# Patient Record
Sex: Female | Born: 1970
Health system: Southern US, Community
[De-identification: ages and names within clinical notes are randomized; demographics above are authoritative.]

## PROBLEM LIST (undated history)

## (undated) DIAGNOSIS — C801 Malignant (primary) neoplasm, unspecified: Secondary | ICD-10-CM

## (undated) DIAGNOSIS — D649 Anemia, unspecified: Secondary | ICD-10-CM

## (undated) DIAGNOSIS — Z8051 Family history of malignant neoplasm of kidney: Secondary | ICD-10-CM

## (undated) DIAGNOSIS — C186 Malignant neoplasm of descending colon: Secondary | ICD-10-CM

## (undated) DIAGNOSIS — F32A Depression, unspecified: Secondary | ICD-10-CM

## (undated) DIAGNOSIS — G43909 Migraine, unspecified, not intractable, without status migrainosus: Secondary | ICD-10-CM

## (undated) DIAGNOSIS — I10 Essential (primary) hypertension: Secondary | ICD-10-CM

## (undated) DIAGNOSIS — F329 Major depressive disorder, single episode, unspecified: Secondary | ICD-10-CM

## (undated) DIAGNOSIS — Z9889 Other specified postprocedural states: Secondary | ICD-10-CM

## (undated) DIAGNOSIS — K56609 Unspecified intestinal obstruction, unspecified as to partial versus complete obstruction: Secondary | ICD-10-CM

## (undated) DIAGNOSIS — C8190 Hodgkin lymphoma, unspecified, unspecified site: Secondary | ICD-10-CM

## (undated) DIAGNOSIS — E119 Type 2 diabetes mellitus without complications: Secondary | ICD-10-CM

## (undated) HISTORY — PX: DILATION AND CURETTAGE OF UTERUS: SHX78

## (undated) HISTORY — DX: Other specified postprocedural states: Z98.890

## (undated) HISTORY — DX: Family history of malignant neoplasm of kidney: Z80.51

---
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
## 1994-05-29 HISTORY — PX: LYMPH NODE BIOPSY: SHX201

## 2006-03-06 ENCOUNTER — Other Ambulatory Visit: Admission: RE | Admit: 2006-03-06 | Discharge: 2006-03-06 | Payer: Self-pay | Admitting: Family Medicine

## 2006-07-27 ENCOUNTER — Encounter: Admission: RE | Admit: 2006-07-27 | Discharge: 2006-07-27 | Payer: Self-pay | Admitting: Gastroenterology

## 2007-03-11 ENCOUNTER — Other Ambulatory Visit: Admission: RE | Admit: 2007-03-11 | Discharge: 2007-03-11 | Payer: Self-pay | Admitting: Family Medicine

## 2008-04-06 ENCOUNTER — Other Ambulatory Visit: Admission: RE | Admit: 2008-04-06 | Discharge: 2008-04-06 | Payer: Self-pay | Admitting: Family Medicine

## 2009-05-14 ENCOUNTER — Other Ambulatory Visit: Admission: RE | Admit: 2009-05-14 | Discharge: 2009-05-14 | Payer: Self-pay | Admitting: Family Medicine

## 2010-12-12 ENCOUNTER — Other Ambulatory Visit (HOSPITAL_COMMUNITY)
Admission: RE | Admit: 2010-12-12 | Discharge: 2010-12-12 | Disposition: A | Discharge status: Home / Self Care | Payer: BC Managed Care – PPO | Source: Ambulatory Visit | Attending: Family Medicine | Admitting: Family Medicine

## 2010-12-12 ENCOUNTER — Other Ambulatory Visit: Payer: Self-pay | Admitting: Family Medicine

## 2010-12-12 DIAGNOSIS — Z01419 Encounter for gynecological examination (general) (routine) without abnormal findings: Secondary | ICD-10-CM | POA: Insufficient documentation

## 2015-06-30 DIAGNOSIS — E119 Type 2 diabetes mellitus without complications: Secondary | ICD-10-CM

## 2015-06-30 HISTORY — DX: Type 2 diabetes mellitus without complications: E11.9

## 2015-07-07 ENCOUNTER — Other Ambulatory Visit (HOSPITAL_COMMUNITY)
Admission: RE | Admit: 2015-07-07 | Discharge: 2015-07-07 | Disposition: A | Discharge status: Home / Self Care | Payer: 59 | Source: Ambulatory Visit | Attending: Family Medicine | Admitting: Family Medicine

## 2015-07-07 ENCOUNTER — Other Ambulatory Visit: Payer: Self-pay | Admitting: Family Medicine

## 2015-07-07 DIAGNOSIS — Z01419 Encounter for gynecological examination (general) (routine) without abnormal findings: Secondary | ICD-10-CM | POA: Insufficient documentation

## 2015-07-08 LAB — CYTOLOGY - PAP

## 2015-12-28 DIAGNOSIS — C186 Malignant neoplasm of descending colon: Secondary | ICD-10-CM

## 2015-12-28 HISTORY — DX: Malignant neoplasm of descending colon: C18.6

## 2016-01-16 ENCOUNTER — Emergency Department (HOSPITAL_COMMUNITY): Payer: BLUE CROSS/BLUE SHIELD

## 2016-01-16 ENCOUNTER — Encounter (HOSPITAL_COMMUNITY): Payer: Self-pay | Admitting: Emergency Medicine

## 2016-01-16 ENCOUNTER — Inpatient Hospital Stay (HOSPITAL_COMMUNITY)
Admission: EM | Admit: 2016-01-16 | Discharge: 2016-01-25 | DRG: 330 | Disposition: A | Discharge status: Home Health | Payer: BLUE CROSS/BLUE SHIELD | Attending: Internal Medicine | Admitting: Internal Medicine

## 2016-01-16 DIAGNOSIS — D649 Anemia, unspecified: Secondary | ICD-10-CM | POA: Diagnosis present

## 2016-01-16 DIAGNOSIS — C186 Malignant neoplasm of descending colon: Principal | ICD-10-CM

## 2016-01-16 DIAGNOSIS — E878 Other disorders of electrolyte and fluid balance, not elsewhere classified: Secondary | ICD-10-CM | POA: Diagnosis not present

## 2016-01-16 DIAGNOSIS — M3390 Dermatopolymyositis, unspecified, organ involvement unspecified: Secondary | ICD-10-CM | POA: Diagnosis not present

## 2016-01-16 DIAGNOSIS — K3189 Other diseases of stomach and duodenum: Secondary | ICD-10-CM | POA: Diagnosis not present

## 2016-01-16 DIAGNOSIS — K5900 Constipation, unspecified: Secondary | ICD-10-CM | POA: Diagnosis not present

## 2016-01-16 DIAGNOSIS — K6389 Other specified diseases of intestine: Secondary | ICD-10-CM | POA: Diagnosis not present

## 2016-01-16 DIAGNOSIS — E876 Hypokalemia: Secondary | ICD-10-CM | POA: Diagnosis not present

## 2016-01-16 DIAGNOSIS — Y92234 Operating room of hospital as the place of occurrence of the external cause: Secondary | ICD-10-CM | POA: Diagnosis not present

## 2016-01-16 DIAGNOSIS — C189 Malignant neoplasm of colon, unspecified: Secondary | ICD-10-CM | POA: Diagnosis not present

## 2016-01-16 DIAGNOSIS — K5660 Unspecified intestinal obstruction: Secondary | ICD-10-CM

## 2016-01-16 DIAGNOSIS — R933 Abnormal findings on diagnostic imaging of other parts of digestive tract: Secondary | ICD-10-CM | POA: Diagnosis not present

## 2016-01-16 DIAGNOSIS — K913 Postprocedural intestinal obstruction: Secondary | ICD-10-CM | POA: Diagnosis not present

## 2016-01-16 DIAGNOSIS — E119 Type 2 diabetes mellitus without complications: Secondary | ICD-10-CM | POA: Diagnosis not present

## 2016-01-16 DIAGNOSIS — E118 Type 2 diabetes mellitus with unspecified complications: Secondary | ICD-10-CM

## 2016-01-16 DIAGNOSIS — Z8572 Personal history of non-Hodgkin lymphomas: Secondary | ICD-10-CM

## 2016-01-16 DIAGNOSIS — R140 Abdominal distension (gaseous): Secondary | ICD-10-CM | POA: Diagnosis not present

## 2016-01-16 DIAGNOSIS — Z7984 Long term (current) use of oral hypoglycemic drugs: Secondary | ICD-10-CM | POA: Diagnosis not present

## 2016-01-16 DIAGNOSIS — C772 Secondary and unspecified malignant neoplasm of intra-abdominal lymph nodes: Secondary | ICD-10-CM | POA: Diagnosis present

## 2016-01-16 DIAGNOSIS — T819XXA Unspecified complication of procedure, initial encounter: Secondary | ICD-10-CM

## 2016-01-16 DIAGNOSIS — K5669 Other intestinal obstruction: Secondary | ICD-10-CM | POA: Diagnosis not present

## 2016-01-16 DIAGNOSIS — Z9221 Personal history of antineoplastic chemotherapy: Secondary | ICD-10-CM | POA: Diagnosis not present

## 2016-01-16 DIAGNOSIS — K567 Ileus, unspecified: Secondary | ICD-10-CM | POA: Diagnosis not present

## 2016-01-16 DIAGNOSIS — F32A Depression, unspecified: Secondary | ICD-10-CM | POA: Diagnosis present

## 2016-01-16 DIAGNOSIS — F329 Major depressive disorder, single episode, unspecified: Secondary | ICD-10-CM | POA: Diagnosis not present

## 2016-01-16 DIAGNOSIS — C187 Malignant neoplasm of sigmoid colon: Secondary | ICD-10-CM | POA: Diagnosis not present

## 2016-01-16 DIAGNOSIS — R1032 Left lower quadrant pain: Secondary | ICD-10-CM | POA: Diagnosis not present

## 2016-01-16 DIAGNOSIS — K56609 Unspecified intestinal obstruction, unspecified as to partial versus complete obstruction: Secondary | ICD-10-CM | POA: Diagnosis present

## 2016-01-16 DIAGNOSIS — R1110 Vomiting, unspecified: Secondary | ICD-10-CM | POA: Diagnosis not present

## 2016-01-16 DIAGNOSIS — M3313 Other dermatomyositis without myopathy: Secondary | ICD-10-CM | POA: Diagnosis present

## 2016-01-16 DIAGNOSIS — Z79899 Other long term (current) drug therapy: Secondary | ICD-10-CM | POA: Diagnosis not present

## 2016-01-16 DIAGNOSIS — R112 Nausea with vomiting, unspecified: Secondary | ICD-10-CM | POA: Diagnosis not present

## 2016-01-16 HISTORY — DX: Type 2 diabetes mellitus without complications: E11.9

## 2016-01-16 HISTORY — DX: Hodgkin lymphoma, unspecified, unspecified site: C81.90

## 2016-01-16 HISTORY — DX: Malignant (primary) neoplasm, unspecified: C80.1

## 2016-01-16 HISTORY — DX: Unspecified intestinal obstruction, unspecified as to partial versus complete obstruction: K56.609

## 2016-01-16 HISTORY — DX: Migraine, unspecified, not intractable, without status migrainosus: G43.909

## 2016-01-16 HISTORY — DX: Major depressive disorder, single episode, unspecified: F32.9

## 2016-01-16 HISTORY — DX: Malignant neoplasm of descending colon: C18.6

## 2016-01-16 HISTORY — DX: Depression, unspecified: F32.A

## 2016-01-16 LAB — URINALYSIS, ROUTINE W REFLEX MICROSCOPIC
BILIRUBIN URINE: NEGATIVE
Glucose, UA: NEGATIVE mg/dL
Hgb urine dipstick: NEGATIVE
KETONES UR: 40 mg/dL — AB
LEUKOCYTES UA: NEGATIVE
NITRITE: NEGATIVE
PH: 6 (ref 5.0–8.0)
PROTEIN: NEGATIVE mg/dL
Specific Gravity, Urine: 1.02 (ref 1.005–1.030)

## 2016-01-16 LAB — CBC WITH DIFFERENTIAL/PLATELET
BASOS PCT: 0 %
Basophils Absolute: 0 10*3/uL (ref 0.0–0.1)
EOS ABS: 0 10*3/uL (ref 0.0–0.7)
Eosinophils Relative: 0 %
HEMATOCRIT: 38.7 % (ref 36.0–46.0)
Hemoglobin: 12.2 g/dL (ref 12.0–15.0)
Lymphocytes Relative: 13 %
Lymphs Abs: 2.2 10*3/uL (ref 0.7–4.0)
MCH: 24.3 pg — AB (ref 26.0–34.0)
MCHC: 31.5 g/dL (ref 30.0–36.0)
MCV: 76.9 fL — ABNORMAL LOW (ref 78.0–100.0)
MONO ABS: 0.9 10*3/uL (ref 0.1–1.0)
MONOS PCT: 5 %
NEUTROS ABS: 13.3 10*3/uL — AB (ref 1.7–7.7)
Neutrophils Relative %: 82 %
Platelets: 442 10*3/uL — ABNORMAL HIGH (ref 150–400)
RBC: 5.03 MIL/uL (ref 3.87–5.11)
RDW: 14.8 % (ref 11.5–15.5)
WBC: 16.3 10*3/uL — ABNORMAL HIGH (ref 4.0–10.5)

## 2016-01-16 LAB — COMPREHENSIVE METABOLIC PANEL
ALBUMIN: 3.9 g/dL (ref 3.5–5.0)
ALT: 11 U/L — ABNORMAL LOW (ref 14–54)
ANION GAP: 15 (ref 5–15)
AST: 17 U/L (ref 15–41)
Alkaline Phosphatase: 74 U/L (ref 38–126)
BILIRUBIN TOTAL: 1.3 mg/dL — AB (ref 0.3–1.2)
BUN: 10 mg/dL (ref 6–20)
CO2: 23 mmol/L (ref 22–32)
Calcium: 9.5 mg/dL (ref 8.9–10.3)
Chloride: 99 mmol/L — ABNORMAL LOW (ref 101–111)
Creatinine, Ser: 0.79 mg/dL (ref 0.44–1.00)
GFR calc non Af Amer: >60 mL/min (ref >60)
GLUCOSE: 175 mg/dL — AB (ref 65–99)
POTASSIUM: 3.1 mmol/L — AB (ref 3.5–5.1)
Sodium: 137 mmol/L (ref 135–145)
TOTAL PROTEIN: 7.5 g/dL (ref 6.5–8.1)

## 2016-01-16 LAB — GLUCOSE, CAPILLARY
GLUCOSE-CAPILLARY: 110 mg/dL — AB (ref 65–99)
GLUCOSE-CAPILLARY: 115 mg/dL — AB (ref 65–99)

## 2016-01-16 LAB — LIPASE, BLOOD: LIPASE: 27 U/L (ref 11–51)

## 2016-01-16 LAB — MAGNESIUM: Magnesium: 2.5 mg/dL — ABNORMAL HIGH (ref 1.7–2.4)

## 2016-01-16 LAB — PREGNANCY, URINE: PREG TEST UR: NEGATIVE

## 2016-01-16 LAB — PHOSPHORUS: PHOSPHORUS: 3.8 mg/dL (ref 2.5–4.6)

## 2016-01-16 MED ORDER — SODIUM CHLORIDE 0.9 % IV BOLUS (SEPSIS)
1000.0000 mL | Freq: Once | INTRAVENOUS | Status: AC
  Administered 2016-01-16: 1000 mL via INTRAVENOUS

## 2016-01-16 MED ORDER — ONDANSETRON HCL 4 MG/2ML IJ SOLN
INTRAMUSCULAR | Status: AC
  Administered 2016-01-16: 4 mg
  Filled 2016-01-16: qty 2

## 2016-01-16 MED ORDER — POTASSIUM CHLORIDE 10 MEQ/100ML IV SOLN
10.0000 meq | INTRAVENOUS | Status: AC
  Administered 2016-01-16 (×6): 10 meq via INTRAVENOUS
  Filled 2016-01-16 (×2): qty 100

## 2016-01-16 MED ORDER — SODIUM CHLORIDE 0.9 % IV SOLN
INTRAVENOUS | Status: DC
  Administered 2016-01-16 – 2016-01-19 (×6): via INTRAVENOUS

## 2016-01-16 MED ORDER — ONDANSETRON HCL 4 MG/2ML IJ SOLN
4.0000 mg | Freq: Once | INTRAMUSCULAR | Status: AC
  Administered 2016-01-16: 4 mg via INTRAVENOUS
  Filled 2016-01-16: qty 2

## 2016-01-16 MED ORDER — FLEET ENEMA 7-19 GM/118ML RE ENEM
2.0000 | ENEMA | Freq: Once | RECTAL | Status: AC
  Administered 2016-01-16: 2 via RECTAL
  Filled 2016-01-16: qty 2

## 2016-01-16 MED ORDER — HEPARIN SODIUM (PORCINE) 5000 UNIT/ML IJ SOLN
5000.0000 [IU] | Freq: Three times a day (TID) | INTRAMUSCULAR | Status: DC
  Administered 2016-01-16 – 2016-01-17 (×3): 5000 [IU] via SUBCUTANEOUS
  Filled 2016-01-16 (×2): qty 1

## 2016-01-16 MED ORDER — KETOROLAC TROMETHAMINE 15 MG/ML IJ SOLN: 15.0000 mg | Freq: Four times a day (QID) | INTRAMUSCULAR | Status: DC | PRN

## 2016-01-16 MED ORDER — ONDANSETRON HCL 4 MG PO TABS: 4.0000 mg | ORAL_TABLET | Freq: Four times a day (QID) | ORAL | Status: DC | PRN

## 2016-01-16 MED ORDER — IOPAMIDOL (ISOVUE-300) INJECTION 61%
INTRAVENOUS | Status: AC
  Administered 2016-01-16: 100 mL
  Filled 2016-01-16: qty 100

## 2016-01-16 MED ORDER — KETOROLAC TROMETHAMINE 15 MG/ML IJ SOLN
INTRAMUSCULAR | Status: AC
  Administered 2016-01-16: 15 mg
  Filled 2016-01-16: qty 1

## 2016-01-16 MED ORDER — ONDANSETRON HCL 4 MG/2ML IJ SOLN
4.0000 mg | Freq: Four times a day (QID) | INTRAMUSCULAR | Status: DC | PRN
Start: 2016-01-16 — End: 2016-01-25
  Administered 2016-01-22 (×2): 4 mg via INTRAVENOUS
  Filled 2016-01-16 (×2): qty 2

## 2016-01-16 MED ORDER — SODIUM CHLORIDE 0.9% FLUSH
3.0000 mL | Freq: Two times a day (BID) | INTRAVENOUS | Status: DC
Start: 2016-01-16 — End: 2016-01-25
  Administered 2016-01-17 – 2016-01-24 (×5): 3 mL via INTRAVENOUS

## 2016-01-16 MED ORDER — MORPHINE SULFATE (PF) 4 MG/ML IV SOLN
4.0000 mg | Freq: Once | INTRAVENOUS | Status: AC
  Administered 2016-01-16: 4 mg via INTRAVENOUS
  Filled 2016-01-16: qty 1

## 2016-01-16 MED ORDER — INSULIN ASPART 100 UNIT/ML ~~LOC~~ SOLN
0.0000 [IU] | SUBCUTANEOUS | Status: DC
  Administered 2016-01-18: 2 [IU] via SUBCUTANEOUS
  Administered 2016-01-18: 3 [IU] via SUBCUTANEOUS
  Administered 2016-01-19 – 2016-01-22 (×3): 2 [IU] via SUBCUTANEOUS
  Administered 2016-01-22 – 2016-01-23 (×2): 3 [IU] via SUBCUTANEOUS
  Administered 2016-01-23 – 2016-01-24 (×3): 2 [IU] via SUBCUTANEOUS
  Administered 2016-01-24: 3 [IU] via SUBCUTANEOUS
  Administered 2016-01-24 – 2016-01-25 (×2): 2 [IU] via SUBCUTANEOUS

## 2016-01-16 MED ORDER — LORAZEPAM 2 MG/ML IJ SOLN
1.0000 mg | Freq: Four times a day (QID) | INTRAMUSCULAR | Status: DC | PRN
  Administered 2016-01-16 – 2016-01-22 (×5): 1 mg via INTRAVENOUS
  Filled 2016-01-16 (×5): qty 1

## 2016-01-16 NOTE — ED Notes (Signed)
Returned from [facility].  No change in status

## 2016-01-16 NOTE — Consult Note (Signed)
Reason for Consult: Bowel obstruction Referring Physician: Dr. Angie Fava Taylor Whitney is an 45 y.o. female.  HPI: Patient is a 45 year old female who arrives with a 3 day history of obstipation. Patient states her last bowel movement was Thursday prior to admission. She states it was small. She states that since that time she's had no flatus or bowel movements. She also states she's develop some right-sided abdominal pain. She's had nausea and vomiting over the last day or so. She's had no fevers or chills.  Patient has not noticed any blood in her stools. She has not had a previous colonoscopy. She has no family history of any cancer. She does have a history of Hodgkin's lymphoma. This was treated with chemotherapy.  While in the ER patient underwent a CT scan which revealed a left colon stricture versus mass.  Past Medical History:  Diagnosis Date  . Cancer ([place])   . Diabetes mellitus without complication ([place])     No past surgical history on file.  No family history on file.  Social History:  reports that she has never smoked. She has never used smokeless tobacco. She reports that she does not drink alcohol or use drugs.  Allergies:  Allergies  Allergen Reactions  . Sulfa Antibiotics Rash    Medications: I have reviewed the patient's current medications.  Results for orders placed or performed during the hospital encounter of 01/16/16 (from the past 48 hour(s))  CBC with Differential/Platelet     Status: Abnormal   Collection Time: 01/16/16  7:20 AM  Result Value Ref Range   WBC 16.3 (H) 4.0 - 10.5 K/uL   RBC 5.03 3.87 - 5.11 MIL/uL   Hemoglobin 12.2 12.0 - 15.0 g/dL   HCT 38.7 36.0 - 46.0 %   MCV 76.9 (L) 78.0 - 100.0 fL   MCH 24.3 (L) 26.0 - 34.0 pg   MCHC 31.5 30.0 - 36.0 g/dL   RDW 14.8 11.5 - 15.5 %   Platelets 442 (H) 150 - 400 K/uL   Neutrophils Relative % 82 %   Neutro Abs 13.3 (H) 1.7 - 7.7 K/uL   Lymphocytes Relative 13 %   Lymphs Abs 2.2 0.7 - 4.0  K/uL   Monocytes Relative 5 %   Monocytes Absolute 0.9 0.1 - 1.0 K/uL   Eosinophils Relative 0 %   Eosinophils Absolute 0.0 0.0 - 0.7 K/uL   Basophils Relative 0 %   Basophils Absolute 0.0 0.0 - 0.1 K/uL  Comprehensive metabolic panel     Status: Abnormal   Collection Time: 01/16/16  7:20 AM  Result Value Ref Range   Sodium 137 135 - 145 mmol/L   Potassium 3.1 (L) 3.5 - 5.1 mmol/L   Chloride 99 (L) 101 - 111 mmol/L   CO2 23 22 - 32 mmol/L   Glucose, Bld 175 (H) 65 - 99 mg/dL   BUN 10 6 - 20 mg/dL   Creatinine, Ser 0.79 0.44 - 1.00 mg/dL   Calcium 9.5 8.9 - 10.3 mg/dL   Total Protein 7.5 6.5 - 8.1 g/dL   Albumin 3.9 3.5 - 5.0 g/dL   AST 17 15 - 41 U/L   ALT 11 (L) 14 - 54 U/L   Alkaline Phosphatase 74 38 - 126 U/L   Total Bilirubin 1.3 (H) 0.3 - 1.2 mg/dL   GFR calc non Af Amer >60 >60 mL/min   GFR calc Af Amer >60 >60 mL/min    Comment: (NOTE) The eGFR has been calculated using  the CKD EPI equation. This calculation has not been validated in all clinical situations. eGFR's persistently <60 mL/min signify possible Chronic Kidney Disease.    Anion gap 15 5 - 15  Lipase, blood     Status: None   Collection Time: 01/16/16  7:20 AM  Result Value Ref Range   Lipase 27 11 - 51 U/L  Urinalysis, Routine w reflex microscopic (not at [facility])     Status: Abnormal   Collection Time: 01/16/16  8:57 AM  Result Value Ref Range   Color, Urine YELLOW YELLOW   APPearance CLEAR CLEAR   Specific Gravity, Urine 1.020 1.005 - 1.030   pH 6.0 5.0 - 8.0   Glucose, UA NEGATIVE NEGATIVE mg/dL   Hgb urine dipstick NEGATIVE NEGATIVE   Bilirubin Urine NEGATIVE NEGATIVE   Ketones, ur 40 (A) NEGATIVE mg/dL   Protein, ur NEGATIVE NEGATIVE mg/dL   Nitrite NEGATIVE NEGATIVE   Leukocytes, UA NEGATIVE NEGATIVE    Comment: MICROSCOPIC NOT DONE ON URINES WITH NEGATIVE PROTEIN, BLOOD, LEUKOCYTES, NITRITE, OR GLUCOSE <1000 mg/dL.  Pregnancy, urine     Status: None   Collection Time: 01/16/16  8:57 AM   Result Value Ref Range   Preg Test, Ur NEGATIVE NEGATIVE    Comment:        THE SENSITIVITY OF THIS METHODOLOGY IS >20 mIU/mL.     Ct Abdomen Pelvis W Contrast  Result Date: 01/16/2016 CLINICAL DATA:  Constipation. Vomiting and abdominal pain, 2 days duration. EXAM: CT ABDOMEN AND PELVIS WITH CONTRAST TECHNIQUE: Multidetector CT imaging of the abdomen and pelvis was performed using the standard protocol following bolus administration of intravenous contrast. CONTRAST:  174m ISOVUE-300 IOPAMIDOL (ISOVUE-300) INJECTION 61% COMPARISON:  07/27/2006. FINDINGS: Lower chest: Lungs are clear. There is focal thickening of or along the left diaphragm posterior medially. This was present in 2008 and has not changed. It could represent a benign mesenchymal mass. It probably does not require further follow-up. Hepatobiliary: Mild fatty change of the liver. No focal lesion. No calcified gallstones. Pancreas: Normal Spleen: Normal Adrenals/Urinary Tract: Adrenal glands are normal. Kidneys are normal. No cyst, mass, stone or hydronephrosis. Stomach/Bowel: The right colon, transverse colon and proximal descending colon are dilated and full of fluid and stool. There is a distinct caliber change at the mid descending colon. This could be due to a mass or a stricture. There is mild adjacent fat edema. Early perforation not excluded. No evidence of free air. The dilated portion of the colon shows wall thickening and mild stranding in the surrounding fat. This could be inflammation due to the obstruction. Vascular/Lymphatic: Aorta and IVC are normal. No retroperitoneal mass or lymphadenopathy. Reproductive: 2.6 cm leiomyoma projecting from the right fundus of the uterus. Both ovaries and adnexal regions appear normal. Other: None. Musculoskeletal:  Chronic disc herniations at L2-3, L3-4 and L4-5. IMPRESSION: Pattern of colon obstruction at the mid descending colon. Proximal to that, the colon is markedly dilated with fluid,  air and fecal matter. This could be due to a stricture or mass. In the obstructive portion, there is wall thickening and mild edema of the surrounding fat that could be secondary to the obstruction. Small amount of edema in the fat at the site of obstruction is also noted. No definite perforation or free air however. No evidence of adenopathy. Electronically Signed   By: MNelson ChimesM.D.   On: 01/16/2016 13:03   Dg Abd Acute W/chest  Result Date: 01/16/2016 CLINICAL DATA:  Constipation starting Friday, vomiting,  nausea EXAM: DG ABDOMEN ACUTE W/ 1V CHEST COMPARISON:  07/27/2006 FINDINGS: Cardiomediastinal silhouette is unremarkable. No acute infiltrate or pulmonary edema. There is distension of the right colon and transverse colon with gas and fluid. Paucity of bowel gas in distal colon. Colonic ileus or colonic obstruction cannot be excluded. No evidence of free abdominal air. Clinical correlation is necessary. IMPRESSION: No acute disease within chest.There is distension of the right colon and transverse colon with gas and fluid. Paucity of bowel gas in distal colon. Colonic ileus or colonic obstruction cannot be excluded. No evidence of free abdominal air. Clinical correlation is necessary. Electronically Signed   By: Lahoma Crocker M.D.   On: 01/16/2016 10:19    Review of Systems  Constitutional: Negative for chills, fever and weight loss.  HENT: Negative for ear pain, hearing loss and tinnitus.   Eyes: Negative for blurred vision, double vision and photophobia.  Respiratory: Negative for cough, hemoptysis and sputum production.   Cardiovascular: Negative for chest pain, palpitations, orthopnea and claudication.  Gastrointestinal: Positive for abdominal pain, constipation, nausea and vomiting. Negative for blood in stool, diarrhea, heartburn and melena.  Genitourinary: Negative for dysuria, frequency and hematuria.  Musculoskeletal: Negative for back pain, myalgias and neck pain.  Neurological:  Negative for dizziness, tingling, tremors, sensory change and headaches.  Psychiatric/Behavioral: Negative for depression, substance abuse and suicidal ideas.   Blood pressure 137/84, pulse 83, temperature 98.1 F (36.7 C), temperature source Oral, resp. rate 18, height '5\' 3"'  (1.6 m), weight 77.1 kg (170 lb), last menstrual period 01/03/2016, SpO2 96 %. Physical Exam  Constitutional: She is oriented to person, place, and time. She appears well-developed and well-nourished. No distress.  HENT:  Head: Normocephalic and atraumatic.  Right Ear: External ear normal.  Left Ear: External ear normal.  Eyes: Conjunctivae and EOM are normal. Pupils are equal, round, and reactive to light.  Neck: Normal range of motion. Neck supple. No tracheal deviation present.  Cardiovascular: Normal rate and regular rhythm.  Exam reveals no gallop and no friction rub.   No murmur heard. Respiratory: Effort normal and breath sounds normal. No stridor. No respiratory distress. She has no wheezes. She has no rales. She exhibits no tenderness.  GI: Soft. Bowel sounds are normal. She exhibits no distension and no mass. There is tenderness (rlq). There is no rebound and no guarding.  Musculoskeletal: Normal range of motion. She exhibits no edema or tenderness.  Neurological: She is alert and oriented to person, place, and time.  Skin: Skin is warm and dry. She is not diaphoretic.  Psychiatric: She has a normal mood and affect. Her behavior is normal.    Assessment/Plan: 45 year old female with a left colonic stricture versus mass Diabetes History of Hodgkin's lymphoma  1. At This time recommend admission, nothing by mouth. Patient currently no emesis by mouth continues may need NG tube. 2. We'll consult GI, Dr. Almyra Free, to evaluate for colonoscopy and/or mass. 3. CEA pending 4. We'll follow along.  Taylor Whitney., Taylor Whitney 01/16/2016, 2:20 PM

## 2016-01-16 NOTE — ED Triage Notes (Signed)
Pt experiencing constipation since Wednesday.  Nausea and vomiting starting Friday night.  Since then has "kept nothing down" and feels she may be dehydrated.

## 2016-01-16 NOTE — H&P (Signed)
[facility] History and Physical  Taylor Whitney [id number] DOB: [birth year] DOA: 01/16/2016  PCP: No primary care provider on file.   Chief Complaint: "I just kept vomiting."  HPI: Taylor Whitney is a 45 y.o. female with past oral history significant for diabetes and non-Hodgkin's lymphoma presents to emergency room with complaint nausea vomiting. Patient states she hasn't had a bowel movement since August 17. She has had some flatus. Over the past 2 days patient has had increasing nausea and vomiting. Patient had 10 episodes of vomiting the patient presented to the emergency room. On vomitus was dark green. Nonbloody. Patient states she has no history of obstruction. She's had no past abdominal or intra-peritoneal GYN surgeries.   ED course: CT revealed transverse and proximal large bowel obstruction In the emergency room general surgery and gastroenterology were consulted. Hospitalist service asked NG tube be placed.    Review of Systems:  As per HPI otherwise 10 point review of systems negative.   Past Medical History:  Diagnosis Date  . Cancer ([place])   . Diabetes mellitus without complication ([place])    No past surgical history on file. Social History:  reports that she has never smoked. She has never used smokeless tobacco. She reports that she does not drink alcohol or use drugs.  Allergies  Allergen Reactions  . Sulfa Antibiotics Rash    No family history on file.   Prior to Admission medications   Medication Sig Start Date End Date Taking? Authorizing Provider  metFORMIN (GLUCOPHAGE-XR) 500 MG 24 hr tablet Take 500 mg by mouth 2 (two) times daily. 10/27/15  Yes Historical Provider, MD  venlafaxine XR (EFFEXOR-XR) 75 MG 24 hr capsule Take 225 mg by mouth at bedtime. 12/28/15  Yes Historical Provider, MD   Physical Exam: Vitals:   01/16/16 1300 01/16/16 1330 01/16/16 1400 01/16/16 1430  BP: 137/84 139/84 147/91 150/89  Pulse: 83 84 89 85  Resp:        Temp:      TempSrc:      SpO2: 96% 100% 100% 100%  Weight:      Height:        Wt Readings from Last 3 Encounters:  01/16/16 77.1 kg (170 lb)    General:  Appears calm and uncomfortable Eyes:  PERRL, EOMI, normal lids, iris ENT:  grossly normal hearing, lips & tongue Neck:  no LAD, masses or thyromegaly Cardiovascular:  RRR, no m/r/g. No LE edema.  Respiratory:  CTA bilaterally, no w/r/r. Normal respiratory effort. Abdomen:  Distended for, right sided tenderness to palpation without rebound, BS+ Skin:  no rash or induration seen on limited exam Musculoskeletal:  grossly normal tone BUE/BLE Psychiatric:  grossly normal mood and affect, speech fluent and appropriate Neurologic:  CN 2-12 grossly intact, moves all extremities in coordinated fashion.          Labs on Admission:  Basic Metabolic Panel:  Recent Labs Lab 01/16/16 0720  NA 137  K 3.1*  CL 99*  CO2 23  GLUCOSE 175*  BUN 10  CREATININE 0.79  CALCIUM 9.5   Liver Function Tests:  Recent Labs Lab 01/16/16 0720  AST 17  ALT 11*  ALKPHOS 74  BILITOT 1.3*  PROT 7.5  ALBUMIN 3.9    Recent Labs Lab 01/16/16 0720  LIPASE 27   No results for input(s): AMMONIA in the last 168 hours. CBC:  Recent Labs Lab 01/16/16 0720  WBC 16.3*  NEUTROABS 13.3*  HGB 12.2  HCT 38.7  MCV 76.9*  PLT 442*   Cardiac Enzymes: No results for input(s): CKTOTAL, CKMB, CKMBINDEX, TROPONINI in the last 168 hours.  BNP (last 3 results) No results for input(s): BNP in the last 8760 hours.  ProBNP (last 3 results) No results for input(s): PROBNP in the last 8760 hours.   Serum creatinine: 0.8 mg/dL 01/16/16 0720 Estimated creatinine clearance: 89.2 mL/min  CBG: No results for input(s): GLUCAP in the last 168 hours.  Radiological Exams on Admission: Ct Abdomen Pelvis W Contrast  Result Date: 01/16/2016 CLINICAL DATA:  Constipation. Vomiting and abdominal pain, 2 days duration. EXAM: CT ABDOMEN AND PELVIS WITH  CONTRAST TECHNIQUE: Multidetector CT imaging of the abdomen and pelvis was performed using the standard protocol following bolus administration of intravenous contrast. CONTRAST:  168mL ISOVUE-300 IOPAMIDOL (ISOVUE-300) INJECTION 61% COMPARISON:  07/27/2006. FINDINGS: Lower chest: Lungs are clear. There is focal thickening of or along the left diaphragm posterior medially. This was present in 2008 and has not changed. It could represent a benign mesenchymal mass. It probably does not require further follow-up. Hepatobiliary: Mild fatty change of the liver. No focal lesion. No calcified gallstones. Pancreas: Normal Spleen: Normal Adrenals/Urinary Tract: Adrenal glands are normal. Kidneys are normal. No cyst, mass, stone or hydronephrosis. Stomach/Bowel: The right colon, transverse colon and proximal descending colon are dilated and full of fluid and stool. There is a distinct caliber change at the mid descending colon. This could be due to a mass or a stricture. There is mild adjacent fat edema. Early perforation not excluded. No evidence of free air. The dilated portion of the colon shows wall thickening and mild stranding in the surrounding fat. This could be inflammation due to the obstruction. Vascular/Lymphatic: Aorta and IVC are normal. No retroperitoneal mass or lymphadenopathy. Reproductive: 2.6 cm leiomyoma projecting from the right fundus of the uterus. Both ovaries and adnexal regions appear normal. Other: None. Musculoskeletal:  Chronic disc herniations at L2-3, L3-4 and L4-5. IMPRESSION: Pattern of colon obstruction at the mid descending colon. Proximal to that, the colon is markedly dilated with fluid, air and fecal matter. This could be due to a stricture or mass. In the obstructive portion, there is wall thickening and mild edema of the surrounding fat that could be secondary to the obstruction. Small amount of edema in the fat at the site of obstruction is also noted. No definite perforation or free  air however. No evidence of adenopathy. Electronically Signed   By: Nelson Chimes M.D.   On: 01/16/2016 13:03   Dg Abd Acute W/chest  Result Date: 01/16/2016 CLINICAL DATA:  Constipation starting Friday, vomiting, nausea EXAM: DG ABDOMEN ACUTE W/ 1V CHEST COMPARISON:  07/27/2006 FINDINGS: Cardiomediastinal silhouette is unremarkable. No acute infiltrate or pulmonary edema. There is distension of the right colon and transverse colon with gas and fluid. Paucity of bowel gas in distal colon. Colonic ileus or colonic obstruction cannot be excluded. No evidence of free abdominal air. Clinical correlation is necessary. IMPRESSION: No acute disease within chest.There is distension of the right colon and transverse colon with gas and fluid. Paucity of bowel gas in distal colon. Colonic ileus or colonic obstruction cannot be excluded. No evidence of free abdominal air. Clinical correlation is necessary. Electronically Signed   By: Lahoma Crocker M.D.   On: 01/16/2016 10:19    EKG: ordered  Assessment/Plan Active Problems:   Large bowel obstruction (HCC)   DM (dermatomyositis)  Large bowel obstruction Concerning his patient's history of non-Hodgkin's lymphoma Gen. surgery was  consulted in the emergency room and will follow along Gastroenterology is also following-awaiting recommendations Transverse and proximal: Obstructed, seen on CT abdomen and pelvis Based on vitals labs patient is currently have abdominal sepsis, wbc is up. Patient has never had abdominal surgery and has no history of severe constipation or obstruction of any kind. The source was also not visualized on imaging. Do this patient is told to be at high risk complication and will need frequent monitoring so placed on telemetry floor. Ordered [place] tube be placed in the emergency room, low intermittent suction Zofran IV when necessary nausea Nothing by mouth When necessary Toradol for severe pain and fever IV CEA pending KUB in AM  portable As patient may need surgery or procedure EKG ordered 2 fleets enemas ordered by gastroneurology  Electrolyte imbalance Potassium low will replace IV Now check magnesium phosphorus We'll monitor electrolytes daily while nothing by mouth  Diabetes Checking A1c Sliding scale insulin every 4 schedule  Depression No SI/HI Holding effexor When necessary Ativan IV for sleep and anxiety  Code Status: full DVT Prophylaxis: heparin Family Communication: pt will call Disposition Plan: Pending Improvement  INPT, TELE  Elwin Mocha, MD Family Medicine [facility] [email or web address] Password [email or web address]

## 2016-01-16 NOTE — ED Provider Notes (Signed)
[facility] DEPT Provider Note   CSN: [id number] Arrival date & time: 01/16/16  [id number]     History   Chief Complaint Chief Complaint  Patient presents with  . Constipation  . Nausea  . Emesis    HPI Taylor Whitney is a 45 y.o. female.  HPI Patient presents with 2 days of nausea and vomiting with abdominal cramping. States she's not had a bowel movement since Thursday and this passed no gas today. No fever or chills. No sick contacts or recent travel. Denies any urinary symptoms. No blood in vomit. No previous abdominal surgeries. Past Medical History:  Diagnosis Date  . Cancer ([place])   . Diabetes mellitus without complication [facility])     Patient Active Problem List   Diagnosis Date Noted  . Large bowel obstruction ([place]) 01/16/2016  . DM (dermatomyositis) 01/16/2016  . Depression 01/16/2016  . Electrolyte abnormality 01/16/2016    No past surgical history on file.  OB History    No data available       Home Medications    Prior to Admission medications   Medication Sig Start Date End Date Taking? Authorizing Provider  metFORMIN (GLUCOPHAGE-XR) 500 MG 24 hr tablet Take 500 mg by mouth 2 (two) times daily. 10/27/15  Yes Historical Provider, MD  venlafaxine XR (EFFEXOR-XR) 75 MG 24 hr capsule Take 225 mg by mouth at bedtime. 12/28/15  Yes Historical Provider, MD    Family History No family history on file.  Social History Social History  Substance Use Topics  . Smoking status: Never Smoker  . Smokeless tobacco: Never Used  . Alcohol use No     Allergies   Sulfa antibiotics   Review of Systems Review of Systems  Constitutional: Negative for chills and fever.  Respiratory: Negative for chest tightness and shortness of breath.   Cardiovascular: Negative for chest pain, palpitations and leg swelling.  Gastrointestinal: Positive for abdominal pain, constipation, nausea and vomiting. Negative for abdominal distention and diarrhea.  Genitourinary:  Negative for dysuria, flank pain, hematuria, pelvic pain, vaginal bleeding and vaginal discharge.  Musculoskeletal: Negative for back pain, myalgias, neck pain and neck stiffness.  Skin: Negative for rash and wound.  Neurological: Negative for dizziness, weakness, light-headedness and numbness.  All other systems reviewed and are negative.    Physical Exam Updated Vital Signs BP 150/89   Pulse 85   Temp 98.1 F (36.7 C) (Oral)   Resp 18   Ht 5\' 3"  (1.6 m)   Wt 170 lb (77.1 kg)   LMP 01/03/2016 (Approximate)   SpO2 100%   BMI 30.11 kg/m   Physical Exam  Constitutional: She is oriented to person, place, and time. She appears well-developed and well-nourished.  HENT:  Head: Normocephalic and atraumatic.  Mouth/Throat: Oropharynx is clear and moist.  Eyes: EOM are normal. Pupils are equal, round, and reactive to light.  Neck: Normal range of motion. Neck supple.  Cardiovascular: Normal rate and regular rhythm.   Pulmonary/Chest: Effort normal and breath sounds normal.  Abdominal: Soft. There is no tenderness. There is no rebound and no guarding.  Hyperactive bowel sounds  Musculoskeletal: Normal range of motion. She exhibits no edema or tenderness.  No CVA tenderness  Neurological: She is alert and oriented to person, place, and time.  Skin: Skin is warm and dry. No rash noted. No erythema. No pallor.  Psychiatric: She has a normal mood and affect. Her behavior is normal.  Nursing note and vitals reviewed.    ED Treatments /  Results  Labs (all labs ordered are listed, but only abnormal results are displayed) Labs Reviewed  CBC WITH DIFFERENTIAL/PLATELET - Abnormal; Notable for the following:       Result Value   WBC 16.3 (*)    MCV 76.9 (*)    MCH 24.3 (*)    Platelets 442 (*)    Neutro Abs 13.3 (*)    All other components within normal limits  COMPREHENSIVE METABOLIC PANEL - Abnormal; Notable for the following:    Potassium 3.1 (*)    Chloride 99 (*)    Glucose,  Bld 175 (*)    ALT 11 (*)    Total Bilirubin 1.3 (*)    All other components within normal limits  URINALYSIS, ROUTINE W REFLEX MICROSCOPIC (NOT AT [facility]) - Abnormal; Notable for the following:    Ketones, ur 40 (*)    All other components within normal limits  LIPASE, BLOOD  PREGNANCY, URINE  CEA  MAGNESIUM  PHOSPHORUS    EKG  EKG Interpretation  Date/Time:  Sunday January 16 2016 15:47:12 EDT Ventricular Rate:  84 PR Interval:    QRS Duration: 111 QT Interval:  376 QTC Calculation: 445 R Axis:   -7 Text Interpretation:  Normal sinus rhythm Normal ECG Confirmed by RAY MD, DANIELLE ([id number]) on 01/16/2016 3:52:05 PM       Radiology Ct Abdomen Pelvis W Contrast  Result Date: 01/16/2016 CLINICAL DATA:  Constipation. Vomiting and abdominal pain, 2 days duration. EXAM: CT ABDOMEN AND PELVIS WITH CONTRAST TECHNIQUE: Multidetector CT imaging of the abdomen and pelvis was performed using the standard protocol following bolus administration of intravenous contrast. CONTRAST:  139mL ISOVUE-300 IOPAMIDOL (ISOVUE-300) INJECTION 61% COMPARISON:  07/27/2006. FINDINGS: Lower chest: Lungs are clear. There is focal thickening of or along the left diaphragm posterior medially. This was present in 2008 and has not changed. It could represent a benign mesenchymal mass. It probably does not require further follow-up. Hepatobiliary: Mild fatty change of the liver. No focal lesion. No calcified gallstones. Pancreas: Normal Spleen: Normal Adrenals/Urinary Tract: Adrenal glands are normal. Kidneys are normal. No cyst, mass, stone or hydronephrosis. Stomach/Bowel: The right colon, transverse colon and proximal descending colon are dilated and full of fluid and stool. There is a distinct caliber change at the mid descending colon. This could be due to a mass or a stricture. There is mild adjacent fat edema. Early perforation not excluded. No evidence of free air. The dilated portion of the colon shows wall  thickening and mild stranding in the surrounding fat. This could be inflammation due to the obstruction. Vascular/Lymphatic: Aorta and IVC are normal. No retroperitoneal mass or lymphadenopathy. Reproductive: 2.6 cm leiomyoma projecting from the right fundus of the uterus. Both ovaries and adnexal regions appear normal. Other: None. Musculoskeletal:  Chronic disc herniations at L2-3, L3-4 and L4-5. IMPRESSION: Pattern of colon obstruction at the mid descending colon. Proximal to that, the colon is markedly dilated with fluid, air and fecal matter. This could be due to a stricture or mass. In the obstructive portion, there is wall thickening and mild edema of the surrounding fat that could be secondary to the obstruction. Small amount of edema in the fat at the site of obstruction is also noted. No definite perforation or free air however. No evidence of adenopathy. Electronically Signed   By: Nelson Chimes M.D.   On: 01/16/2016 13:03   Dg Abd Acute W/chest  Result Date: 01/16/2016 CLINICAL DATA:  Constipation starting Friday, vomiting, nausea EXAM:  DG ABDOMEN ACUTE W/ 1V CHEST COMPARISON:  07/27/2006 FINDINGS: Cardiomediastinal silhouette is unremarkable. No acute infiltrate or pulmonary edema. There is distension of the right colon and transverse colon with gas and fluid. Paucity of bowel gas in distal colon. Colonic ileus or colonic obstruction cannot be excluded. No evidence of free abdominal air. Clinical correlation is necessary. IMPRESSION: No acute disease within chest.There is distension of the right colon and transverse colon with gas and fluid. Paucity of bowel gas in distal colon. Colonic ileus or colonic obstruction cannot be excluded. No evidence of free abdominal air. Clinical correlation is necessary. Electronically Signed   By: Lahoma Crocker M.D.   On: 01/16/2016 10:19    Procedures Procedures (including critical care time)  Medications Ordered in ED Medications  sodium phosphate (FLEET) 7-19  GM/118ML enema 2 enema (not administered)  potassium chloride 10 mEq in 100 mL IVPB (not administered)  sodium chloride 0.9 % bolus 1,000 mL (0 mLs Intravenous Stopped 01/16/16 0846)  ondansetron (ZOFRAN) injection 4 mg (4 mg Intravenous Given 01/16/16 0750)  iopamidol (ISOVUE-300) 61 % injection (100 mLs  Contrast Given 01/16/16 1200)  sodium chloride 0.9 % bolus 1,000 mL (0 mLs Intravenous Stopped 01/16/16 1412)  morphine 4 MG/ML injection 4 mg (4 mg Intravenous Given 01/16/16 1152)     Initial Impression / Assessment and Plan / ED Course  I have reviewed the triage vital signs and the nursing notes.  Pertinent labs & imaging results that were available during my care of the patient were reviewed by me and considered in my medical decision making (see chart for details).  Clinical Course   CT with evidence of colonic obstruction. Discussed with Dr. Rosendo Gros who will evaluate the patient in the emergency department. Asked to have medicine admit. Discussed with Dr. Aggie Moats who will see the patient and admit to telemetry bed.   Final Clinical Impressions(s) / ED Diagnoses   Final diagnoses:  Large bowel obstruction [facility])    New Prescriptions Current Discharge Medication List       Julianne Rice, MD 01/16/16 [phone]

## 2016-01-16 NOTE — ED Notes (Signed)
Attempted report to 6N 

## 2016-01-16 NOTE — Consult Note (Signed)
Consult for [facility]  Reason for Consult: Colonic obstruction Referring Physician: CCS  Stephani [employer] [place] HPI: This is a 45 year old female with a PMH of Hodgkin's lymphoma and DM admitted for complaints of abdominal pain and nausea and vomiting.  She reports that her last bowel movement was last Thursday, but she was feeling ill on Wednesday.  Progressively her symptoms worsened to the point that she presented to the ER.  A CT scan was performed and there was a finding of a mid descending colon stricture versus mass.  Proximal to this point, the colon was distended and filled with fluid, air, and stool.  Surgery evaluated the patient and requests an endoscopic evaluation before pursuing surgery.  There is no known family history of colon cancer.  Past Medical History:  Diagnosis Date  . Cancer ([place])   . Diabetes mellitus without complication ([place])     No past surgical history on file.  No family history on file.  Social History:  reports that she has never smoked. She has never used smokeless tobacco. She reports that she does not drink alcohol or use drugs.  Allergies:  Allergies  Allergen Reactions  . Sulfa Antibiotics Rash    Medications: Scheduled: Continuous:  Results for orders placed or performed during the hospital encounter of 01/16/16 (from the past 24 hour(s))  CBC with Differential/Platelet     Status: Abnormal   Collection Time: 01/16/16  7:20 AM  Result Value Ref Range   WBC 16.3 (H) 4.0 - 10.5 K/uL   RBC 5.03 3.87 - 5.11 MIL/uL   Hemoglobin 12.2 12.0 - 15.0 g/dL   HCT 38.7 36.0 - 46.0 %   MCV 76.9 (L) 78.0 - 100.0 fL   MCH 24.3 (L) 26.0 - 34.0 pg   MCHC 31.5 30.0 - 36.0 g/dL   RDW 14.8 11.5 - 15.5 %   Platelets 442 (H) 150 - 400 K/uL   Neutrophils Relative % 82 %   Neutro Abs 13.3 (H) 1.7 - 7.7 K/uL   Lymphocytes Relative 13 %   Lymphs Abs 2.2 0.7 - 4.0 K/uL   Monocytes Relative 5 %   Monocytes Absolute 0.9 0.1 - 1.0 K/uL   Eosinophils Relative 0 %    Eosinophils Absolute 0.0 0.0 - 0.7 K/uL   Basophils Relative 0 %   Basophils Absolute 0.0 0.0 - 0.1 K/uL  Comprehensive metabolic panel     Status: Abnormal   Collection Time: 01/16/16  7:20 AM  Result Value Ref Range   Sodium 137 135 - 145 mmol/L   Potassium 3.1 (L) 3.5 - 5.1 mmol/L   Chloride 99 (L) 101 - 111 mmol/L   CO2 23 22 - 32 mmol/L   Glucose, Bld 175 (H) 65 - 99 mg/dL   BUN 10 6 - 20 mg/dL   Creatinine, Ser 0.79 0.44 - 1.00 mg/dL   Calcium 9.5 8.9 - 10.3 mg/dL   Total Protein 7.5 6.5 - 8.1 g/dL   Albumin 3.9 3.5 - 5.0 g/dL   AST 17 15 - 41 U/L   ALT 11 (L) 14 - 54 U/L   Alkaline Phosphatase 74 38 - 126 U/L   Total Bilirubin 1.3 (H) 0.3 - 1.2 mg/dL   GFR calc non Af Amer >60 >60 mL/min   GFR calc Af Amer >60 >60 mL/min   Anion gap 15 5 - 15  Lipase, blood     Status: None   Collection Time: 01/16/16  7:20 AM  Result Value  Ref Range   Lipase 27 11 - 51 U/L  Urinalysis, Routine w reflex microscopic (not at [facility])     Status: Abnormal   Collection Time: 01/16/16  8:57 AM  Result Value Ref Range   Color, Urine YELLOW YELLOW   APPearance CLEAR CLEAR   Specific Gravity, Urine 1.020 1.005 - 1.030   pH 6.0 5.0 - 8.0   Glucose, UA NEGATIVE NEGATIVE mg/dL   Hgb urine dipstick NEGATIVE NEGATIVE   Bilirubin Urine NEGATIVE NEGATIVE   Ketones, ur 40 (A) NEGATIVE mg/dL   Protein, ur NEGATIVE NEGATIVE mg/dL   Nitrite NEGATIVE NEGATIVE   Leukocytes, UA NEGATIVE NEGATIVE  Pregnancy, urine     Status: None   Collection Time: 01/16/16  8:57 AM  Result Value Ref Range   Preg Test, Ur NEGATIVE NEGATIVE     Ct Abdomen Pelvis W Contrast  Result Date: 01/16/2016 CLINICAL DATA:  Constipation. Vomiting and abdominal pain, 2 days duration. EXAM: CT ABDOMEN AND PELVIS WITH CONTRAST TECHNIQUE: Multidetector CT imaging of the abdomen and pelvis was performed using the standard protocol following bolus administration of intravenous contrast. CONTRAST:  145mL ISOVUE-300 IOPAMIDOL  (ISOVUE-300) INJECTION 61% COMPARISON:  07/27/2006. FINDINGS: Lower chest: Lungs are clear. There is focal thickening of or along the left diaphragm posterior medially. This was present in 2008 and has not changed. It could represent a benign mesenchymal mass. It probably does not require further follow-up. Hepatobiliary: Mild fatty change of the liver. No focal lesion. No calcified gallstones. Pancreas: Normal Spleen: Normal Adrenals/Urinary Tract: Adrenal glands are normal. Kidneys are normal. No cyst, mass, stone or hydronephrosis. Stomach/Bowel: The right colon, transverse colon and proximal descending colon are dilated and full of fluid and stool. There is a distinct caliber change at the mid descending colon. This could be due to a mass or a stricture. There is mild adjacent fat edema. Early perforation not excluded. No evidence of free air. The dilated portion of the colon shows wall thickening and mild stranding in the surrounding fat. This could be inflammation due to the obstruction. Vascular/Lymphatic: Aorta and IVC are normal. No retroperitoneal mass or lymphadenopathy. Reproductive: 2.6 cm leiomyoma projecting from the right fundus of the uterus. Both ovaries and adnexal regions appear normal. Other: None. Musculoskeletal:  Chronic disc herniations at L2-3, L3-4 and L4-5. IMPRESSION: Pattern of colon obstruction at the mid descending colon. Proximal to that, the colon is markedly dilated with fluid, air and fecal matter. This could be due to a stricture or mass. In the obstructive portion, there is wall thickening and mild edema of the surrounding fat that could be secondary to the obstruction. Small amount of edema in the fat at the site of obstruction is also noted. No definite perforation or free air however. No evidence of adenopathy. Electronically Signed   By: Nelson Chimes M.D.   On: 01/16/2016 13:03   Dg Abd Acute W/chest  Result Date: 01/16/2016 CLINICAL DATA:  Constipation starting Friday,  vomiting, nausea EXAM: DG ABDOMEN ACUTE W/ 1V CHEST COMPARISON:  07/27/2006 FINDINGS: Cardiomediastinal silhouette is unremarkable. No acute infiltrate or pulmonary edema. There is distension of the right colon and transverse colon with gas and fluid. Paucity of bowel gas in distal colon. Colonic ileus or colonic obstruction cannot be excluded. No evidence of free abdominal air. Clinical correlation is necessary. IMPRESSION: No acute disease within chest.There is distension of the right colon and transverse colon with gas and fluid. Paucity of bowel gas in distal colon. Colonic ileus or  colonic obstruction cannot be excluded. No evidence of free abdominal air. Clinical correlation is necessary. Electronically Signed   By: Lahoma Crocker M.D.   On: 01/16/2016 10:19    ROS:  As stated above in the HPI otherwise negative.  Blood pressure 137/84, pulse 83, temperature 98.1 F (36.7 C), temperature source Oral, resp. rate 18, height 5\' 3"  (1.6 m), weight 77.1 kg (170 lb), last menstrual period 01/03/2016, SpO2 96 %.    PE: Gen: NAD, Alert and Oriented HEENT:  [place]/AT, EOMI Neck: Supple, no LAD Lungs: CTA Bilaterally CV: RRR without M/G/R ABM: Soft, NTND, +BS, mild tympany Ext: No C/C/E  Assessment/Plan: 1) Mid descending colon stricture. 2) ABM pain. 3) Nausea and vomiting.   The CT scan does not reveal any evidence of a perforation, but it cannot definitely be ruled out.  Surgery requested the consultation and I do not think that it is unreasonable to evaluate the area before surgery.  Plan: 1) Flexible sigmoidoscopy tomorrow.   Jadon Harbaugh D 01/16/2016, 2:34 PM

## 2016-01-16 NOTE — ED Notes (Signed)
Patient transported to CT 

## 2016-01-16 NOTE — ED Notes (Signed)
Up to RR to void.  Pt has been NPO since arrival in ED today.

## 2016-01-17 ENCOUNTER — Encounter (HOSPITAL_COMMUNITY): Payer: Self-pay | Admitting: *Deleted

## 2016-01-17 ENCOUNTER — Inpatient Hospital Stay (HOSPITAL_COMMUNITY): Payer: BLUE CROSS/BLUE SHIELD

## 2016-01-17 ENCOUNTER — Encounter (HOSPITAL_COMMUNITY)
Admission: EM | Disposition: A | Discharge status: Home Health | Payer: Self-pay | Source: Home / Self Care | Attending: Internal Medicine

## 2016-01-17 HISTORY — PX: FLEXIBLE SIGMOIDOSCOPY: SHX5431

## 2016-01-17 LAB — CBC
HCT: 32 % — ABNORMAL LOW (ref 36.0–46.0)
Hemoglobin: 9.9 g/dL — ABNORMAL LOW (ref 12.0–15.0)
MCH: 24.5 pg — ABNORMAL LOW (ref 26.0–34.0)
MCHC: 30.9 g/dL (ref 30.0–36.0)
MCV: 79.2 fL (ref 78.0–100.0)
Platelets: 311 K/uL (ref 150–400)
RBC: 4.04 MIL/uL (ref 3.87–5.11)
RDW: 15.2 % (ref 11.5–15.5)
WBC: 12.3 K/uL — ABNORMAL HIGH (ref 4.0–10.5)

## 2016-01-17 LAB — SURGICAL PCR SCREEN
MRSA, PCR: NEGATIVE
STAPHYLOCOCCUS AUREUS: NEGATIVE

## 2016-01-17 LAB — COMPREHENSIVE METABOLIC PANEL WITH GFR
ALT: 9 U/L — ABNORMAL LOW (ref 14–54)
AST: 12 U/L — ABNORMAL LOW (ref 15–41)
Albumin: 2.9 g/dL — ABNORMAL LOW (ref 3.5–5.0)
Alkaline Phosphatase: 55 U/L (ref 38–126)
Anion gap: 8 (ref 5–15)
BUN: 6 mg/dL (ref 6–20)
CO2: 23 mmol/L (ref 22–32)
Calcium: 7.9 mg/dL — ABNORMAL LOW (ref 8.9–10.3)
Chloride: 109 mmol/L (ref 101–111)
Creatinine, Ser: 0.66 mg/dL (ref 0.44–1.00)
GFR calc Af Amer: >60 mL/min
GFR calc non Af Amer: >60 mL/min
Glucose, Bld: 110 mg/dL — ABNORMAL HIGH (ref 65–99)
Potassium: 3 mmol/L — ABNORMAL LOW (ref 3.5–5.1)
Sodium: 140 mmol/L (ref 135–145)
Total Bilirubin: 0.9 mg/dL (ref 0.3–1.2)
Total Protein: 5.6 g/dL — ABNORMAL LOW (ref 6.5–8.1)

## 2016-01-17 LAB — PROTIME-INR
INR: 1.07
Prothrombin Time: 14 seconds (ref 11.4–15.2)

## 2016-01-17 LAB — GLUCOSE, CAPILLARY
GLUCOSE-CAPILLARY: 100 mg/dL — AB (ref 65–99)
GLUCOSE-CAPILLARY: 110 mg/dL — AB (ref 65–99)
GLUCOSE-CAPILLARY: 80 mg/dL (ref 65–99)
GLUCOSE-CAPILLARY: 85 mg/dL (ref 65–99)
GLUCOSE-CAPILLARY: 88 mg/dL (ref 65–99)
Glucose-Capillary: 94 mg/dL (ref 65–99)
Glucose-Capillary: 89 mg/dL (ref 65–99)

## 2016-01-17 LAB — MAGNESIUM: MAGNESIUM: 2 mg/dL (ref 1.7–2.4)

## 2016-01-17 LAB — HEMOGLOBIN A1C
Hgb A1c MFr Bld: 6.9 % — ABNORMAL HIGH (ref 4.8–5.6)
Mean Plasma Glucose: 151 mg/dL

## 2016-01-17 SURGERY — SIGMOIDOSCOPY, FLEXIBLE
Anesthesia: Moderate Sedation

## 2016-01-17 MED ORDER — ORAL CARE MOUTH RINSE
7.0000 mL | Freq: Two times a day (BID) | OROMUCOSAL | Status: DC
  Administered 2016-01-17 – 2016-01-24 (×7): 7 mL via OROMUCOSAL

## 2016-01-17 MED ORDER — MIDAZOLAM HCL 5 MG/ML IJ SOLN
INTRAMUSCULAR | Status: AC
  Filled 2016-01-17: qty 2

## 2016-01-17 MED ORDER — DIPHENHYDRAMINE HCL 50 MG/ML IJ SOLN
INTRAMUSCULAR | Status: AC
  Filled 2016-01-17: qty 1

## 2016-01-17 MED ORDER — FENTANYL CITRATE (PF) 100 MCG/2ML IJ SOLN
INTRAMUSCULAR | Status: DC | PRN
  Administered 2016-01-17 (×3): 25 ug via INTRAVENOUS

## 2016-01-17 MED ORDER — CHLORHEXIDINE GLUCONATE 0.12 % MT SOLN
15.0000 mL | Freq: Two times a day (BID) | OROMUCOSAL | Status: DC
  Administered 2016-01-17 – 2016-01-24 (×11): 15 mL via OROMUCOSAL
  Filled 2016-01-17 (×11): qty 15

## 2016-01-17 MED ORDER — SODIUM CHLORIDE 0.9 % IV SOLN: INTRAVENOUS | Status: DC

## 2016-01-17 MED ORDER — FENTANYL CITRATE (PF) 100 MCG/2ML IJ SOLN
INTRAMUSCULAR | Status: AC
  Filled 2016-01-17: qty 2

## 2016-01-17 MED ORDER — MIDAZOLAM HCL 10 MG/2ML IJ SOLN
INTRAMUSCULAR | Status: DC | PRN
  Administered 2016-01-17: 1 mg via INTRAVENOUS
  Administered 2016-01-17 (×3): 2 mg via INTRAVENOUS

## 2016-01-17 MED ORDER — POTASSIUM CHLORIDE 10 MEQ/100ML IV SOLN
10.0000 meq | INTRAVENOUS | Status: AC
  Administered 2016-01-17 (×2): 10 meq via INTRAVENOUS
  Filled 2016-01-17 (×2): qty 100

## 2016-01-17 NOTE — Progress Notes (Signed)
Patient ID: Taylor Whitney, female   DOB: 02/14/1971, 45 y.o.   MRN: [id number]    Progress Note   Subjective  Resting - says had small BM on her own a few hours ago, had fleets enema yesterday - NG in with 500 cc out  No significant abdominal pain   Objective   Vital signs in last 24 hours: Temp:  [98.7 F (37.1 C)-100.4 F (38 C)] 99.1 F (37.3 C) (08/21 0542) Pulse Rate:  [83-99] 99 (08/21 0542) Resp:  [15-19] 15 (08/21 0542) BP: (120-152)/(67-99) 120/67 (08/21 0542) SpO2:  [94 %-100 %] 100 % (08/21 0542) Weight:  [167 lb 5.1 oz (75.9 kg)] 167 lb 5.1 oz (75.9 kg) (08/21 0542) Last BM Date: 01/16/16 (x's 2 after fleets enema per report) General:    white female in NAD NG in Heart:  Regular rate and rhythm; no murmurs Lungs: Respirations even and unlabored, lungs CTA bilaterally Abdomen:  Soft, mild tenderness across lower abdomen, BS quiet  No palp mass  Extremities:  Without edema. Neurologic:  Alert and oriented,  grossly normal neurologically. Psych:  Cooperative. Normal mood and affect.  Intake/Output from previous day: 08/20 0701 - 08/21 0700 In: 3750.8 [I.V.:3420.8; NG/GT:30; IV Piggyback:300] Out: 2603 [Urine:2201; Emesis/NG output:400; Stool:2] Intake/Output this shift: No intake/output data recorded.  Lab Results:  Recent Labs  01/16/16 0720 01/17/16 0507  WBC 16.3* 12.3*  HGB 12.2 9.9*  HCT 38.7 32.0*  PLT 442* 311   BMET  Recent Labs  01/16/16 0720 01/17/16 0507  NA 137 140  K 3.1* 3.0*  CL 99* 109  CO2 23 23  GLUCOSE 175* 110*  BUN 10 6  CREATININE 0.79 0.66  CALCIUM 9.5 7.9*   LFT  Recent Labs  01/17/16 0507  PROT 5.6*  ALBUMIN 2.9*  AST 12*  ALT 9*  ALKPHOS 55  BILITOT 0.9   PT/INR  Recent Labs  01/17/16 0507  LABPROT 14.0  INR 1.07    Studies/Results: Dg Abd 1 View  Result Date: 01/17/2016 CLINICAL DATA:  Large bowel obstruction. EXAM: ABDOMEN - 1 VIEW COMPARISON:  CT 01/16/2016.  Abdominal series 820 2017.  FINDINGS: Persistent right and transverse colon mild distention. No small bowel distention. No free air. No acute bony abnormality. IMPRESSION: Persistent right and transverse colon mild distention. No small bowel distention. The Electronically Signed   By: [place]   On: 01/17/2016 07:05   Ct Abdomen Pelvis W Contrast  Result Date: 01/16/2016 CLINICAL DATA:  Constipation. Vomiting and abdominal pain, 2 days duration. EXAM: CT ABDOMEN AND PELVIS WITH CONTRAST TECHNIQUE: Multidetector CT imaging of the abdomen and pelvis was performed using the standard protocol following bolus administration of intravenous contrast. CONTRAST:  164mL ISOVUE-300 IOPAMIDOL (ISOVUE-300) INJECTION 61% COMPARISON:  07/27/2006. FINDINGS: Lower chest: Lungs are clear. There is focal thickening of or along the left diaphragm posterior medially. This was present in 2008 and has not changed. It could represent a benign mesenchymal mass. It probably does not require further follow-up. Hepatobiliary: Mild fatty change of the liver. No focal lesion. No calcified gallstones. Pancreas: Normal Spleen: Normal Adrenals/Urinary Tract: Adrenal glands are normal. Kidneys are normal. No cyst, mass, stone or hydronephrosis. Stomach/Bowel: The right colon, transverse colon and proximal descending colon are dilated and full of fluid and stool. There is a distinct caliber change at the mid descending colon. This could be due to a mass or a stricture. There is mild adjacent fat edema. Early perforation not excluded. No evidence of  free air. The dilated portion of the colon shows wall thickening and mild stranding in the surrounding fat. This could be inflammation due to the obstruction. Vascular/Lymphatic: Aorta and IVC are normal. No retroperitoneal mass or lymphadenopathy. Reproductive: 2.6 cm leiomyoma projecting from the right fundus of the uterus. Both ovaries and adnexal regions appear normal. Other: None. Musculoskeletal:  Chronic disc  herniations at L2-3, L3-4 and L4-5. IMPRESSION: Pattern of colon obstruction at the mid descending colon. Proximal to that, the colon is markedly dilated with fluid, air and fecal matter. This could be due to a stricture or mass. In the obstructive portion, there is wall thickening and mild edema of the surrounding fat that could be secondary to the obstruction. Small amount of edema in the fat at the site of obstruction is also noted. No definite perforation or free air however. No evidence of adenopathy. Electronically Signed   By: Nelson Chimes M.D.   On: 01/16/2016 13:03   Dg Abd Acute W/chest  Result Date: 01/16/2016 CLINICAL DATA:  Constipation starting Friday, vomiting, nausea EXAM: DG ABDOMEN ACUTE W/ 1V CHEST COMPARISON:  07/27/2006 FINDINGS: Cardiomediastinal silhouette is unremarkable. No acute infiltrate or pulmonary edema. There is distension of the right colon and transverse colon with gas and fluid. Paucity of bowel gas in distal colon. Colonic ileus or colonic obstruction cannot be excluded. No evidence of free abdominal air. Clinical correlation is necessary. IMPRESSION: No acute disease within chest.There is distension of the right colon and transverse colon with gas and fluid. Paucity of bowel gas in distal colon. Colonic ileus or colonic obstruction cannot be excluded. No evidence of free abdominal air. Clinical correlation is necessary. Electronically Signed   By: Lahoma Crocker M.D.   On: 01/16/2016 10:19      Assessment / Plan:    #1 45 yo female with colon obstruction mid descending colon - suspect mass, stricture  #2 hx of hodgkins lymphoma - 20 years ago s/p chemo - no radiation #3 normocytic anemia  Plan: Continue NG decompression For Flex this am - fleets enemas pre procedure Has been on SQ heparin -last dose  6 am - will d/c  Surgery following and expect laparotomy ? tomorrow  Principal Problem:   Large bowel obstruction ([place]) Active Problems:   DM (dermatomyositis)    Depression   Electrolyte abnormality    LOS: 1 day   Amy Esterwood  01/17/2016, 9:08 AM      Attending physician's note   I have taken an interval history, reviewed the chart and examined the patient. I agree with the Advanced Practitioner's note, impression and recommendations. Descending colon obstruction-R/O mass, stricture. Flex sig today with minimal insufflation which could limit exam. In addition enemas may not prep descending colon adequately. Gen Surgery is following patient.   Lucio Edward, MD [facility] [phone] Mon-Fri 8a-5p [phone] after 5p, weekends, holidays

## 2016-01-17 NOTE — Progress Notes (Signed)
PROGRESS NOTE    Taylor Whitney  [id number] DOB: [birth year] DOA: 01/16/2016 PCP: No primary care provider on file. (Confirm with patient/family/NH records and if not entered, this HAS to be entered at [facility] point of entry. "No PCP" if truly none.)   Brief Narrative:  Taylor Whitney is a 45 y.o. female with past oral history significant for diabetes and non-Hodgkin's lymphoma presents to emergency room with complaint nausea vomiting. CT abdomen reveals proximal large bowel obstruction. Surgery and GI consulted.  NG tube placed.   Assessment & Plan:   Principal Problem:   Large bowel obstruction (HCC) Active Problems:   DM (dermatomyositis)   Depression   Electrolyte abnormality    Large bowel obstruction probably secondary to obstructing tumour in the descending colon. Underwent flex sigmoidoscopy. Biopsies were done and pending.  Continue with nG tube. Gentle hydration, pain control. IV fluids.  Continue NPO. Await pathology results.  Prn IV anti emetics.  Further recommendations to follow.    Diabetes Mellitus: CBG (last 3)   Recent Labs  01/17/16 0434 01/17/16 0723 01/17/16 1326  GLUCAP 100* 94 89  hgba1c is 6.9  Resume SSI.    Hypokalemia: repleted. Get magnesium  Level Repeat in am.    Depression:  No SI/HI.  Monitor.  Get repeat level in am.    DVT prophylaxis: scd's Code Status:  Full code.  Family Communication: none at bedside.  Disposition Plan: pending further eval.    Consultants:   Surgery.    Procedures: sigmoidoscopy 8/ 21   Antimicrobials: none   Subjective: Wants to know when she can go home.   Objective: Vitals:   01/17/16 1230 01/17/16 1240 01/17/16 1250 01/17/16 1407  BP: 120/81 (!) 130/98 128/81 131/78  Pulse: 91 92 94 (!) 104  Resp: (!) 23 (!) 22 (!) 22 20  Temp:    98.7 F (37.1 C)  TempSrc:    Oral  SpO2: 100% 100% 100% 97%  Weight:      Height:        Intake/Output Summary (Last 24 hours) at  01/17/16 1702 Last data filed at 01/17/16 1647  Gross per 24 hour  Intake          1650.83 ml  Output             4104 ml  Net         -2453.17 ml   Filed Weights   01/16/16 0656 01/17/16 0542  Weight: 77.1 kg (170 lb) 75.9 kg (167 lb 5.1 oz)    Examination:  General exam: Appears calm and comfortable on NG tube.  Respiratory system: Clear to auscultation. Respiratory effort normal. Cardiovascular system: S1 & S2 heard, RRR. No JVD, murmurs, rubs, gallops or clicks. No pedal edema. Gastrointestinal system: Abdomen is distended. Bowel sounds heard.  Central nervous system: Alert and oriented. No focal neurological deficits. Extremities: Symmetric 5 x 5 power. Skin: No rashes, lesions or ulcers Psychiatry: Judgement and insight appear normal. Mood & affect appropriate.     Data Reviewed: I have personally reviewed following labs and imaging studies  CBC:  Recent Labs Lab 01/16/16 0720 01/17/16 0507  WBC 16.3* 12.3*  NEUTROABS 13.3*  --   HGB 12.2 9.9*  HCT 38.7 32.0*  MCV 76.9* 79.2  PLT 442* [id number]   Basic Metabolic Panel:  Recent Labs Lab 01/16/16 0720 01/16/16 1603 01/17/16 0507  NA 137  --  140  K 3.1*  --  3.0*  CL 99*  --  109  CO2 23  --  23  GLUCOSE 175*  --  110*  BUN 10  --  6  CREATININE 0.79  --  0.66  CALCIUM 9.5  --  7.9*  MG  --  2.5*  --   PHOS  --  3.8  --    GFR: Estimated Creatinine Clearance: 88.5 mL/min (by C-G formula based on SCr of 0.8 mg/dL). Liver Function Tests:  Recent Labs Lab 01/16/16 0720 01/17/16 0507  AST 17 12*  ALT 11* 9*  ALKPHOS 74 55  BILITOT 1.3* 0.9  PROT 7.5 5.6*  ALBUMIN 3.9 2.9*    Recent Labs Lab 01/16/16 0720  LIPASE 27   No results for input(s): AMMONIA in the last 168 hours. Coagulation Profile:  Recent Labs Lab 01/17/16 0507  INR 1.07   Cardiac Enzymes: No results for input(s): CKTOTAL, CKMB, CKMBINDEX, TROPONINI in the last 168 hours. BNP (last 3 results) No results for input(s):  PROBNP in the last 8760 hours. HbA1C:  Recent Labs  01/16/16 1614  HGBA1C 6.9*   CBG:  Recent Labs Lab 01/16/16 2012 01/17/16 0026 01/17/16 0434 01/17/16 0723 01/17/16 1326  GLUCAP 115* 110* 100* 94 89   Lipid Profile: No results for input(s): CHOL, HDL, LDLCALC, TRIG, CHOLHDL, LDLDIRECT in the last 72 hours. Thyroid Function Tests: No results for input(s): TSH, T4TOTAL, FREET4, T3FREE, THYROIDAB in the last 72 hours. Anemia Panel: No results for input(s): VITAMINB12, FOLATE, FERRITIN, TIBC, IRON, RETICCTPCT in the last 72 hours. Sepsis Labs: No results for input(s): PROCALCITON, LATICACIDVEN in the last 168 hours.  No results found for this or any previous visit (from the past 240 hour(s)).       Radiology Studies: Dg Abd 1 View  Result Date: 01/17/2016 CLINICAL DATA:  Large bowel obstruction. EXAM: ABDOMEN - 1 VIEW COMPARISON:  CT 01/16/2016.  Abdominal series 820 2017. FINDINGS: Persistent right and transverse colon mild distention. No small bowel distention. No free air. No acute bony abnormality. IMPRESSION: Persistent right and transverse colon mild distention. No small bowel distention. The Electronically Signed   By: [place]   On: 01/17/2016 07:05   Ct Abdomen Pelvis W Contrast  Result Date: 01/16/2016 CLINICAL DATA:  Constipation. Vomiting and abdominal pain, 2 days duration. EXAM: CT ABDOMEN AND PELVIS WITH CONTRAST TECHNIQUE: Multidetector CT imaging of the abdomen and pelvis was performed using the standard protocol following bolus administration of intravenous contrast. CONTRAST:  12mL ISOVUE-300 IOPAMIDOL (ISOVUE-300) INJECTION 61% COMPARISON:  07/27/2006. FINDINGS: Lower chest: Lungs are clear. There is focal thickening of or along the left diaphragm posterior medially. This was present in 2008 and has not changed. It could represent a benign mesenchymal mass. It probably does not require further follow-up. Hepatobiliary: Mild fatty change of the  liver. No focal lesion. No calcified gallstones. Pancreas: Normal Spleen: Normal Adrenals/Urinary Tract: Adrenal glands are normal. Kidneys are normal. No cyst, mass, stone or hydronephrosis. Stomach/Bowel: The right colon, transverse colon and proximal descending colon are dilated and full of fluid and stool. There is a distinct caliber change at the mid descending colon. This could be due to a mass or a stricture. There is mild adjacent fat edema. Early perforation not excluded. No evidence of free air. The dilated portion of the colon shows wall thickening and mild stranding in the surrounding fat. This could be inflammation due to the obstruction. Vascular/Lymphatic: Aorta and IVC are normal. No retroperitoneal mass or lymphadenopathy. Reproductive: 2.6 cm leiomyoma projecting from the right  fundus of the uterus. Both ovaries and adnexal regions appear normal. Other: None. Musculoskeletal:  Chronic disc herniations at L2-3, L3-4 and L4-5. IMPRESSION: Pattern of colon obstruction at the mid descending colon. Proximal to that, the colon is markedly dilated with fluid, air and fecal matter. This could be due to a stricture or mass. In the obstructive portion, there is wall thickening and mild edema of the surrounding fat that could be secondary to the obstruction. Small amount of edema in the fat at the site of obstruction is also noted. No definite perforation or free air however. No evidence of adenopathy. Electronically Signed   By: Nelson Chimes M.D.   On: 01/16/2016 13:03   Dg Abd Acute W/chest  Result Date: 01/16/2016 CLINICAL DATA:  Constipation starting Friday, vomiting, nausea EXAM: DG ABDOMEN ACUTE W/ 1V CHEST COMPARISON:  07/27/2006 FINDINGS: Cardiomediastinal silhouette is unremarkable. No acute infiltrate or pulmonary edema. There is distension of the right colon and transverse colon with gas and fluid. Paucity of bowel gas in distal colon. Colonic ileus or colonic obstruction cannot be excluded. No  evidence of free abdominal air. Clinical correlation is necessary. IMPRESSION: No acute disease within chest.There is distension of the right colon and transverse colon with gas and fluid. Paucity of bowel gas in distal colon. Colonic ileus or colonic obstruction cannot be excluded. No evidence of free abdominal air. Clinical correlation is necessary. Electronically Signed   By: Lahoma Crocker M.D.   On: 01/16/2016 10:19        Scheduled Meds: . antiseptic oral rinse  7 mL Mouth Rinse q12n4p  . chlorhexidine  15 mL Mouth Rinse BID  . insulin aspart  0-15 Units Subcutaneous Q4H  . sodium chloride flush  3 mL Intravenous Q12H   Continuous Infusions: . sodium chloride 125 mL/hr at 01/17/16 0600     LOS: 1 day    Time spent: 30 minutes.     Hosie Poisson, MD [facility] Pager [phone] If 7PM-7AM, please contact night-coverage [email or web address] Password [email or web address] 01/17/2016, 5:02 PM

## 2016-01-17 NOTE — Progress Notes (Signed)
Discussed with patient the findings of the sigmoidoscopy and the plan to proceed with partial colectomy and end colostomy. We discussed risks of bleeding, infection, spleen injury, kidney/ureter injury. She showed good understanding and decided to proceed.  Gurney Maxin, M.D. [facility] Pg: [phone]

## 2016-01-17 NOTE — Progress Notes (Addendum)
  Progress Note: General Surgery Service   Subjective: No acute issues overnight, small BM  Objective: Vital signs in last 24 hours: Temp:  [98.7 F (37.1 C)-100.4 F (38 C)] 99.1 F (37.3 C) (08/21 0542) Pulse Rate:  [83-99] 99 (08/21 0542) Resp:  [15-19] 15 (08/21 0542) BP: (120-152)/(67-96) 120/67 (08/21 0542) SpO2:  [94 %-100 %] 100 % (08/21 0542) Weight:  [75.9 kg (167 lb 5.1 oz)] 75.9 kg (167 lb 5.1 oz) (08/21 0542) Last BM Date: 01/16/16 (x's 2 after fleets enema per report)  Intake/Output from previous day: 08/20 0701 - 08/21 0700 In: 3750.8 [I.V.:3420.8; NG/GT:30; IV Piggyback:300] Out: 2603 [Urine:2201; Emesis/NG output:400; Stool:2] Intake/Output this shift: Total I/O In: 0  Out: 801 [Urine:800; Stool:1]  Lungs: CTAB  Cardiovascular: RRR  Abd: soft, slight distension, no guarding or rebound  Extremities: no edema  Neuro: AOx4  Lab Results: CBC   Recent Labs  01/16/16 0720 01/17/16 0507  WBC 16.3* 12.3*  HGB 12.2 9.9*  HCT 38.7 32.0*  PLT 442* 311   BMET  Recent Labs  01/16/16 0720 01/17/16 0507  NA 137 140  K 3.1* 3.0*  CL 99* 109  CO2 23 23  GLUCOSE 175* 110*  BUN 10 6  CREATININE 0.79 0.66  CALCIUM 9.5 7.9*   PT/INR  Recent Labs  01/17/16 0507  LABPROT 14.0  INR 1.07   ABG No results for input(s): PHART, HCO3 in the last 72 hours.  Invalid input(s): PCO2, PO2  Studies/Results:  Anti-infectives: Anti-infectives    None      Medications: Scheduled Meds: . antiseptic oral rinse  7 mL Mouth Rinse q12n4p  . chlorhexidine  15 mL Mouth Rinse BID  . insulin aspart  0-15 Units Subcutaneous Q4H  . sodium chloride flush  3 mL Intravenous Q12H   Continuous Infusions: . sodium chloride 125 mL/hr at 01/17/16 0600   PRN Meds:.ketorolac, LORazepam, ondansetron **OR** ondansetron (ZOFRAN) IV  Assessment/Plan: Patient Active Problem List   Diagnosis Date Noted  . Large bowel obstruction ([place]) 01/16/2016  . DM  (dermatomyositis) 01/16/2016  . Depression 01/16/2016  . Electrolyte abnormality 01/16/2016   s/p Procedure(s): FLEXIBLE SIGMOIDOSCOPY 01/17/2016 -obstructing area of left colon concern for mass vs stricture -due to symptoms think surgery very likely unless cause appears benign and GI can relieve obstruction, GI to perform sigmoidoscopy today -will have ostomy nurse come by for discussion and marking   LOS: 1 day   Mickeal Skinner, MD Pg# [phone] [facility]  Sigmoidoscopy performed by Dr. Fuller Plan showing near obstructing mass unable to be traversed. I will plan for surgery for partial colectomy with end colostomy tomorrow  Gurney Maxin, M.D. [facility] Pg: [phone]

## 2016-01-17 NOTE — Consult Note (Signed)
[place] Nurse requested for preoperative stoma site marking  Discussed surgical procedure and stoma creation with patient.  Explained role of the [place] nurse team.  Provided the patient with educational booklet/DVD and provided samples of pouching options.  Answered patient questions.   Examined patient lying, and sitting upright in the bed, in order to place the marking in the patient's visual field, away from any creases or abdominal contour issues and within the rectus muscle.  Attempted to mark below the patient's belt line.   Marked for colostomy in the LLQ  _7___ cm to the left of the umbilicus and [id number] below the umbilicus.  Marked for ileostomy in the RLQ  _7___cm to the right of the umbilicus and  [id number] cm below the umbilicus.  Patient's abdomen cleansed with CHG wipes at site markings, allowed to air dry prior to marking. Pt plans to go to surgery this am. [facility] Nurse team will follow up with patient after surgery for continue ostomy care and teaching.  Julien Girt MSN, RN, [place], [place], [place]

## 2016-01-17 NOTE — H&P (View-Only) (Signed)
Patient ID: Taylor Whitney, female   DOB: May 28, 1971, 45 y.o.   MRN: [id number]    Progress Note   Subjective  Resting - says had small BM on her own a few hours ago, had fleets enema yesterday - NG in with 500 cc out  No significant abdominal pain   Objective   Vital signs in last 24 hours: Temp:  [98.7 F (37.1 C)-100.4 F (38 C)] 99.1 F (37.3 C) (08/21 0542) Pulse Rate:  [83-99] 99 (08/21 0542) Resp:  [15-19] 15 (08/21 0542) BP: (120-152)/(67-99) 120/67 (08/21 0542) SpO2:  [94 %-100 %] 100 % (08/21 0542) Weight:  [167 lb 5.1 oz (75.9 kg)] 167 lb 5.1 oz (75.9 kg) (08/21 0542) Last BM Date: 01/16/16 (x's 2 after fleets enema per report) General:    white female in NAD NG in Heart:  Regular rate and rhythm; no murmurs Lungs: Respirations even and unlabored, lungs CTA bilaterally Abdomen:  Soft, mild tenderness across lower abdomen, BS quiet  No palp mass  Extremities:  Without edema. Neurologic:  Alert and oriented,  grossly normal neurologically. Psych:  Cooperative. Normal mood and affect.  Intake/Output from previous day: 08/20 0701 - 08/21 0700 In: 3750.8 [I.V.:3420.8; NG/GT:30; IV Piggyback:300] Out: 2603 [Urine:2201; Emesis/NG output:400; Stool:2] Intake/Output this shift: No intake/output data recorded.  Lab Results:  Recent Labs  01/16/16 0720 01/17/16 0507  WBC 16.3* 12.3*  HGB 12.2 9.9*  HCT 38.7 32.0*  PLT 442* 311   BMET  Recent Labs  01/16/16 0720 01/17/16 0507  NA 137 140  K 3.1* 3.0*  CL 99* 109  CO2 23 23  GLUCOSE 175* 110*  BUN 10 6  CREATININE 0.79 0.66  CALCIUM 9.5 7.9*   LFT  Recent Labs  01/17/16 0507  PROT 5.6*  ALBUMIN 2.9*  AST 12*  ALT 9*  ALKPHOS 55  BILITOT 0.9   PT/INR  Recent Labs  01/17/16 0507  LABPROT 14.0  INR 1.07    Studies/Results: Dg Abd 1 View  Result Date: 01/17/2016 CLINICAL DATA:  Large bowel obstruction. EXAM: ABDOMEN - 1 VIEW COMPARISON:  CT 01/16/2016.  Abdominal series 820 2017.  FINDINGS: Persistent right and transverse colon mild distention. No small bowel distention. No free air. No acute bony abnormality. IMPRESSION: Persistent right and transverse colon mild distention. No small bowel distention. The Electronically Signed   By: [place]   On: 01/17/2016 07:05   Ct Abdomen Pelvis W Contrast  Result Date: 01/16/2016 CLINICAL DATA:  Constipation. Vomiting and abdominal pain, 2 days duration. EXAM: CT ABDOMEN AND PELVIS WITH CONTRAST TECHNIQUE: Multidetector CT imaging of the abdomen and pelvis was performed using the standard protocol following bolus administration of intravenous contrast. CONTRAST:  153mL ISOVUE-300 IOPAMIDOL (ISOVUE-300) INJECTION 61% COMPARISON:  07/27/2006. FINDINGS: Lower chest: Lungs are clear. There is focal thickening of or along the left diaphragm posterior medially. This was present in 2008 and has not changed. It could represent a benign mesenchymal mass. It probably does not require further follow-up. Hepatobiliary: Mild fatty change of the liver. No focal lesion. No calcified gallstones. Pancreas: Normal Spleen: Normal Adrenals/Urinary Tract: Adrenal glands are normal. Kidneys are normal. No cyst, mass, stone or hydronephrosis. Stomach/Bowel: The right colon, transverse colon and proximal descending colon are dilated and full of fluid and stool. There is a distinct caliber change at the mid descending colon. This could be due to a mass or a stricture. There is mild adjacent fat edema. Early perforation not excluded. No evidence of  free air. The dilated portion of the colon shows wall thickening and mild stranding in the surrounding fat. This could be inflammation due to the obstruction. Vascular/Lymphatic: Aorta and IVC are normal. No retroperitoneal mass or lymphadenopathy. Reproductive: 2.6 cm leiomyoma projecting from the right fundus of the uterus. Both ovaries and adnexal regions appear normal. Other: None. Musculoskeletal:  Chronic disc  herniations at L2-3, L3-4 and L4-5. IMPRESSION: Pattern of colon obstruction at the mid descending colon. Proximal to that, the colon is markedly dilated with fluid, air and fecal matter. This could be due to a stricture or mass. In the obstructive portion, there is wall thickening and mild edema of the surrounding fat that could be secondary to the obstruction. Small amount of edema in the fat at the site of obstruction is also noted. No definite perforation or free air however. No evidence of adenopathy. Electronically Signed   By: Nelson Chimes M.D.   On: 01/16/2016 13:03   Dg Abd Acute W/chest  Result Date: 01/16/2016 CLINICAL DATA:  Constipation starting Friday, vomiting, nausea EXAM: DG ABDOMEN ACUTE W/ 1V CHEST COMPARISON:  07/27/2006 FINDINGS: Cardiomediastinal silhouette is unremarkable. No acute infiltrate or pulmonary edema. There is distension of the right colon and transverse colon with gas and fluid. Paucity of bowel gas in distal colon. Colonic ileus or colonic obstruction cannot be excluded. No evidence of free abdominal air. Clinical correlation is necessary. IMPRESSION: No acute disease within chest.There is distension of the right colon and transverse colon with gas and fluid. Paucity of bowel gas in distal colon. Colonic ileus or colonic obstruction cannot be excluded. No evidence of free abdominal air. Clinical correlation is necessary. Electronically Signed   By: Lahoma Crocker M.D.   On: 01/16/2016 10:19      Assessment / Plan:    #1 45 yo female with colon obstruction mid descending colon - suspect mass, stricture  #2 hx of hodgkins lymphoma - 20 years ago s/p chemo - no radiation #3 normocytic anemia  Plan: Continue NG decompression For Flex this am - fleets enemas pre procedure Has been on SQ heparin -last dose  6 am - will d/c  Surgery following and expect laparotomy ? tomorrow  Principal Problem:   Large bowel obstruction ([place]) Active Problems:   DM (dermatomyositis)    Depression   Electrolyte abnormality    LOS: 1 day   Amy Esterwood  01/17/2016, 9:08 AM      Attending physician's note   I have taken an interval history, reviewed the chart and examined the patient. I agree with the Advanced Practitioner's note, impression and recommendations. Descending colon obstruction-R/O mass, stricture. Flex sig today with minimal insufflation which could limit exam. In addition enemas may not prep descending colon adequately. Gen Surgery is following patient.   Lucio Edward, MD [facility] [phone] Mon-Fri 8a-5p [phone] after 5p, weekends, holidays

## 2016-01-17 NOTE — Interval H&P Note (Signed)
History and Physical Interval Note:  01/17/2016 11:22 AM  Taylor Whitney  has presented today for surgery, with the diagnosis of Descending colon obstruction  The various methods of treatment have been discussed with the patient and family. After consideration of risks, benefits and other options for treatment, the patient has consented to  Procedure(s): FLEXIBLE SIGMOIDOSCOPY (N/A) as a surgical intervention .  The patient's history has been reviewed, patient examined, no change in status, stable for surgery.  I have reviewed the patient's chart and labs.  Questions were answered to the patient's satisfaction.     Pricilla Riffle. Fuller Plan

## 2016-01-17 NOTE — Op Note (Signed)
[facility] Patient Name: Taylor Whitney Procedure Date : 01/17/2016 MRN: [id number] Attending MD: Ladene Artist , MD Date of Birth: [birth year] CSN: [id number] Age: 45 Admit Type: Inpatient Procedure:                Flexible Sigmoidoscopy Indications:              Abnormal CT of the GI tract Providers:                Pricilla Riffle. Fuller Plan, MD, Carolynn Comment, RN, Corliss Parish, Technician Referring MD:             Gurney Maxin, MD Medicines:                Fentanyl 75 micrograms IV, Midazolam 7 mg IV Complications:            No immediate complications. Estimated Blood Loss:     Estimated blood loss was minimal. Procedure:                Pre-Anesthesia Assessment:                           - Prior to the procedure, a History and Physical                            was performed, and patient medications and                            allergies were reviewed. The patient's tolerance of                            previous anesthesia was also reviewed. The risks                            and benefits of the procedure and the sedation                            options and risks were discussed with the patient.                            All questions were answered, and informed consent                            was obtained. Prior Anticoagulants: The patient has                            taken no previous anticoagulant or antiplatelet                            agents. ASA Grade Assessment: II - A patient with                            mild systemic disease. After reviewing the risks  and benefits, the patient was deemed in                            satisfactory condition to undergo the procedure.                           After obtaining informed consent, the scope was                            passed under direct vision. The EC-[id number] ([id number])                            scope was introduced through the anus  and advanced                            to the the descending colon. The EG-[id number] ([id number])                            scope was introduced through the and advanced to                            the. The flexible sigmoidoscopy was technically                            difficult and complex due to poor bowel prep and                            problems with insufflation. Scope change required                            and insuffluation equipment trouble shooting                            required which prolonged the procedure. Successful                            completion of the procedure was aided by lavage,                            suctioning, scope change and equipment changes. Scope In: 11:53:52 AM Scope Out: 12:17:28 PM Total Procedure Duration: 0 hours 23 minutes 36 seconds  Findings:      The perianal and digital rectal examinations were normal.      An obstructing, malignant appearing, medium-sized mass was found in the       descending colon. The mass was circumferential. The lumen diameter       measured 6 mm and could not tranverse the mass with a pediatric       colonoscopy or an upper endoscope. No bleeding was present. This was       biopsied with a cold forceps for histology.      The exam was otherwise normal throughout the examined left colon limited       by the poor bowel prep.      The retroflexed view of the distal rectum and anal verge  was normal and       showed no anal or rectal abnormalities. Impression:               - Malignant appearing, obstructing tumor in the                            descending colon. Biopsied.                           - Otherwise normal flex sig however limited by poor                            prep. Moderate Sedation:      Moderate (conscious) sedation was administered by the endoscopy nurse       and supervised by the endoscopist. The following parameters were       monitored: oxygen saturation, heart rate, blood pressure,  respiratory       rate, EKG, adequacy of pulmonary ventilation, and response to care.       Total physician intraservice time was 29 minutes. Recommendation:           - Return patient to hospital ward for ongoing care.                           - NPO.                           - Await pathology results.                           - Colonoscopy in 6-12 months to evaluate proximal                            colon and areas not well visualized in the left                            colon. Procedure Code(s):        --- Professional ---                           [phone], Sigmoidoscopy, flexible; with biopsy, single                            or multiple                           99152, Moderate sedation services provided by the                            same physician or other qualified health care                            professional performing the diagnostic or                            therapeutic service that the sedation supports,  requiring the presence of an independent trained                            observer to assist in the monitoring of the                            patient's level of consciousness and physiological                            status; initial 15 minutes of intraservice time,                            patient age 50 years or older                           [phone], Moderate sedation services; each additional                            15 minutes intraservice time Diagnosis Code(s):        --- Professional ---                           C18.6, Malignant neoplasm of descending colon                           R93.3, Abnormal findings on diagnostic imaging of                            other parts of digestive tract CPT copyright 2016 American [employer] Association. All rights reserved. The codes documented in this report are preliminary and upon coder review may  be revised to meet current compliance requirements. Ladene Artist,  MD 01/17/2016 12:32:02 PM This report has been signed electronically. Number of Addenda: 0

## 2016-01-18 ENCOUNTER — Inpatient Hospital Stay (HOSPITAL_COMMUNITY): Payer: BLUE CROSS/BLUE SHIELD | Admitting: Certified Registered"

## 2016-01-18 ENCOUNTER — Encounter (HOSPITAL_COMMUNITY): Payer: Self-pay | Admitting: Gastroenterology

## 2016-01-18 ENCOUNTER — Encounter (HOSPITAL_COMMUNITY)
Admission: EM | Disposition: A | Discharge status: Home Health | Payer: Self-pay | Source: Home / Self Care | Attending: Internal Medicine

## 2016-01-18 DIAGNOSIS — C189 Malignant neoplasm of colon, unspecified: Secondary | ICD-10-CM

## 2016-01-18 DIAGNOSIS — E876 Hypokalemia: Secondary | ICD-10-CM

## 2016-01-18 DIAGNOSIS — E118 Type 2 diabetes mellitus with unspecified complications: Secondary | ICD-10-CM

## 2016-01-18 DIAGNOSIS — C186 Malignant neoplasm of descending colon: Secondary | ICD-10-CM

## 2016-01-18 HISTORY — PX: COLON RESECTION: SHX5231

## 2016-01-18 HISTORY — PX: COLOSTOMY: SHX63

## 2016-01-18 HISTORY — PX: PARTIAL COLECTOMY: SHX5273

## 2016-01-18 LAB — TYPE AND SCREEN
ABO/RH(D): AB POS
ANTIBODY SCREEN: NEGATIVE

## 2016-01-18 LAB — GLUCOSE, CAPILLARY
GLUCOSE-CAPILLARY: 89 mg/dL (ref 65–99)
GLUCOSE-CAPILLARY: 88 mg/dL (ref 65–99)
GLUCOSE-CAPILLARY: 122 mg/dL — AB (ref 65–99)
Glucose-Capillary: 90 mg/dL (ref 65–99)
Glucose-Capillary: 129 mg/dL — ABNORMAL HIGH (ref 65–99)
Glucose-Capillary: 158 mg/dL — ABNORMAL HIGH (ref 65–99)

## 2016-01-18 LAB — BASIC METABOLIC PANEL
Anion gap: 13 (ref 5–15)
BUN: 6 mg/dL (ref 6–20)
CALCIUM: 8.6 mg/dL — AB (ref 8.9–10.3)
CHLORIDE: 102 mmol/L (ref 101–111)
CO2: 22 mmol/L (ref 22–32)
CREATININE: 0.69 mg/dL (ref 0.44–1.00)
GFR calc Af Amer: >60 mL/min (ref >60)
GFR calc non Af Amer: >60 mL/min (ref >60)
GLUCOSE: 89 mg/dL (ref 65–99)
Potassium: 2.8 mmol/L — ABNORMAL LOW (ref 3.5–5.1)
Sodium: 137 mmol/L (ref 135–145)

## 2016-01-18 LAB — CEA: CEA: 1.7 ng/mL (ref 0.0–4.7)

## 2016-01-18 LAB — ABO/RH: ABO/RH(D): AB POS

## 2016-01-18 SURGERY — COLON RESECTION
Anesthesia: General | Site: Abdomen

## 2016-01-18 MED ORDER — SUGAMMADEX SODIUM 200 MG/2ML IV SOLN
INTRAVENOUS | Status: AC
  Filled 2016-01-18: qty 2

## 2016-01-18 MED ORDER — MIDAZOLAM HCL 5 MG/5ML IJ SOLN
INTRAMUSCULAR | Status: DC | PRN
  Administered 2016-01-18: 2 mg via INTRAVENOUS

## 2016-01-18 MED ORDER — MORPHINE SULFATE (PF) 4 MG/ML IV SOLN
4.0000 mg | Freq: Once | INTRAVENOUS | Status: AC
  Administered 2016-01-18: 4 mg via INTRAVENOUS
  Filled 2016-01-18: qty 1

## 2016-01-18 MED ORDER — NALOXONE HCL 0.4 MG/ML IJ SOLN: 0.4000 mg | INTRAMUSCULAR | Status: DC | PRN

## 2016-01-18 MED ORDER — HYDROMORPHONE HCL 1 MG/ML IJ SOLN
INTRAMUSCULAR | Status: AC
  Filled 2016-01-18: qty 1

## 2016-01-18 MED ORDER — DEXTROSE 5 % IV SOLN
1.0000 g | INTRAVENOUS | Status: DC
  Filled 2016-01-18: qty 10

## 2016-01-18 MED ORDER — EPHEDRINE SULFATE 50 MG/ML IJ SOLN
INTRAMUSCULAR | Status: DC | PRN
  Administered 2016-01-18 (×4): 5 mg via INTRAVENOUS

## 2016-01-18 MED ORDER — POTASSIUM CHLORIDE 10 MEQ/100ML IV SOLN
10.0000 meq | INTRAVENOUS | Status: AC
  Administered 2016-01-18 – 2016-01-19 (×6): 10 meq via INTRAVENOUS
  Filled 2016-01-18 (×5): qty 100

## 2016-01-18 MED ORDER — ONDANSETRON HCL 4 MG/2ML IJ SOLN
INTRAMUSCULAR | Status: DC | PRN
  Administered 2016-01-18: 4 mg via INTRAVENOUS

## 2016-01-18 MED ORDER — PROPOFOL 10 MG/ML IV BOLUS
INTRAVENOUS | Status: AC
  Filled 2016-01-18: qty 20

## 2016-01-18 MED ORDER — SUCCINYLCHOLINE CHLORIDE 20 MG/ML IJ SOLN
INTRAMUSCULAR | Status: DC | PRN
  Administered 2016-01-18: 60 mg via INTRAVENOUS

## 2016-01-18 MED ORDER — POTASSIUM CHLORIDE 10 MEQ/100ML IV SOLN
10.0000 meq | INTRAVENOUS | Status: DC
  Administered 2016-01-18 (×4): 10 meq via INTRAVENOUS
  Filled 2016-01-18: qty 100

## 2016-01-18 MED ORDER — MORPHINE SULFATE 2 MG/ML IV SOLN
INTRAVENOUS | Status: AC
  Filled 2016-01-18: qty 25

## 2016-01-18 MED ORDER — MIDAZOLAM HCL 2 MG/2ML IJ SOLN
INTRAMUSCULAR | Status: AC
  Filled 2016-01-18: qty 2

## 2016-01-18 MED ORDER — ROCURONIUM BROMIDE 10 MG/ML (PF) SYRINGE
PREFILLED_SYRINGE | INTRAVENOUS | Status: AC
  Filled 2016-01-18: qty 10

## 2016-01-18 MED ORDER — MORPHINE SULFATE 2 MG/ML IV SOLN
INTRAVENOUS | Status: DC
  Administered 2016-01-18: 13:00:00 via INTRAVENOUS
  Administered 2016-01-18: 18 mg via INTRAVENOUS
  Administered 2016-01-18: via INTRAVENOUS
  Administered 2016-01-19: 8 mg via INTRAVENOUS
  Administered 2016-01-19: 20:00:00 via INTRAVENOUS
  Administered 2016-01-19 (×3): 9 mg via INTRAVENOUS
  Administered 2016-01-20: 1 mg via INTRAVENOUS
  Administered 2016-01-20: 3 mg via INTRAVENOUS
  Administered 2016-01-20 (×2): 4 mg via INTRAVENOUS
  Administered 2016-01-20: 3 mg via INTRAVENOUS
  Administered 2016-01-20: 7 mg via INTRAVENOUS
  Administered 2016-01-21: 5 mg via INTRAVENOUS
  Administered 2016-01-21 (×2): 2 mg via INTRAVENOUS
  Administered 2016-01-21: 0.5 mg via INTRAVENOUS
  Filled 2016-01-18 (×2): qty 25

## 2016-01-18 MED ORDER — EPHEDRINE 5 MG/ML INJ
INTRAVENOUS | Status: AC
  Filled 2016-01-18: qty 10

## 2016-01-18 MED ORDER — DEXTROSE 5 % IV SOLN
1.0000 g | Freq: Once | INTRAVENOUS | Status: AC
  Administered 2016-01-19: 1 g via INTRAVENOUS
  Filled 2016-01-18: qty 10

## 2016-01-18 MED ORDER — PHENYLEPHRINE HCL 10 MG/ML IJ SOLN
INTRAMUSCULAR | Status: DC | PRN
  Administered 2016-01-18 (×5): 80 ug via INTRAVENOUS

## 2016-01-18 MED ORDER — LIDOCAINE 2% (20 MG/ML) 5 ML SYRINGE
INTRAMUSCULAR | Status: AC
  Filled 2016-01-18: qty 5

## 2016-01-18 MED ORDER — ONDANSETRON HCL 4 MG/2ML IJ SOLN
INTRAMUSCULAR | Status: AC
  Filled 2016-01-18: qty 2

## 2016-01-18 MED ORDER — SODIUM CHLORIDE 0.9 % IV SOLN
INTRAVENOUS | Status: DC | PRN
  Administered 2016-01-18: 09:00:00 via INTRAVENOUS

## 2016-01-18 MED ORDER — 0.9 % SODIUM CHLORIDE (POUR BTL) OPTIME
TOPICAL | Status: DC | PRN
  Administered 2016-01-18: 2000 mL

## 2016-01-18 MED ORDER — FENTANYL CITRATE (PF) 100 MCG/2ML IJ SOLN
INTRAMUSCULAR | Status: AC
  Filled 2016-01-18: qty 2

## 2016-01-18 MED ORDER — DIPHENHYDRAMINE HCL 12.5 MG/5ML PO ELIX: 12.5000 mg | ORAL_SOLUTION | Freq: Four times a day (QID) | ORAL | Status: DC | PRN

## 2016-01-18 MED ORDER — POTASSIUM CHLORIDE 10 MEQ/100ML IV SOLN: 10.0000 meq | INTRAVENOUS | Status: DC

## 2016-01-18 MED ORDER — ONDANSETRON HCL 4 MG/2ML IJ SOLN
4.0000 mg | Freq: Four times a day (QID) | INTRAMUSCULAR | Status: DC | PRN
  Administered 2016-01-19: 4 mg via INTRAVENOUS
  Filled 2016-01-18: qty 2

## 2016-01-18 MED ORDER — DIPHENHYDRAMINE HCL 50 MG/ML IJ SOLN: 12.5000 mg | Freq: Four times a day (QID) | INTRAMUSCULAR | Status: DC | PRN

## 2016-01-18 MED ORDER — PROPOFOL 10 MG/ML IV BOLUS
INTRAVENOUS | Status: DC | PRN
Start: 2016-01-18 — End: 2016-01-18
  Administered 2016-01-18: 130 mg via INTRAVENOUS

## 2016-01-18 MED ORDER — LIDOCAINE HCL (CARDIAC) 20 MG/ML IV SOLN
INTRAVENOUS | Status: DC | PRN
  Administered 2016-01-18: 60 mg via INTRAVENOUS

## 2016-01-18 MED ORDER — SODIUM CHLORIDE 0.9% FLUSH: 9.0000 mL | INTRAVENOUS | Status: DC | PRN

## 2016-01-18 MED ORDER — HYDROMORPHONE HCL 1 MG/ML IJ SOLN
0.2500 mg | INTRAMUSCULAR | Status: DC | PRN
Start: 2016-01-18 — End: 2016-01-18
  Administered 2016-01-18 (×4): 0.5 mg via INTRAVENOUS

## 2016-01-18 MED ORDER — LACTATED RINGERS IV SOLN
INTRAVENOUS | Status: DC
  Administered 2016-01-18 – 2016-01-22 (×6): via INTRAVENOUS

## 2016-01-18 MED ORDER — FENTANYL CITRATE (PF) 100 MCG/2ML IJ SOLN
INTRAMUSCULAR | Status: DC | PRN
  Administered 2016-01-18: 100 ug via INTRAVENOUS
  Administered 2016-01-18 (×2): 50 ug via INTRAVENOUS

## 2016-01-18 MED ORDER — ROCURONIUM BROMIDE 100 MG/10ML IV SOLN
INTRAVENOUS | Status: DC | PRN
  Administered 2016-01-18: 10 mg via INTRAVENOUS
  Administered 2016-01-18: 30 mg via INTRAVENOUS

## 2016-01-18 MED ORDER — SUCCINYLCHOLINE CHLORIDE 200 MG/10ML IV SOSY
PREFILLED_SYRINGE | INTRAVENOUS | Status: AC
  Filled 2016-01-18: qty 10

## 2016-01-18 MED ORDER — SUGAMMADEX SODIUM 200 MG/2ML IV SOLN
INTRAVENOUS | Status: DC | PRN
  Administered 2016-01-18: 200 mg via INTRAVENOUS

## 2016-01-18 MED ORDER — PHENYLEPHRINE 40 MCG/ML (10ML) SYRINGE FOR IV PUSH (FOR BLOOD PRESSURE SUPPORT)
PREFILLED_SYRINGE | INTRAVENOUS | Status: AC
  Filled 2016-01-18: qty 10

## 2016-01-18 MED ORDER — DEXTROSE 5 % IV SOLN
INTRAVENOUS | Status: DC | PRN
  Administered 2016-01-18: 1 g via INTRAVENOUS

## 2016-01-18 SURGICAL SUPPLY — 55 items
BLADE SURG ROTATE 9660 (MISCELLANEOUS) IMPLANT
CANISTER SUCTION 2500CC (MISCELLANEOUS) ×2 IMPLANT
CHLORAPREP W/TINT 26ML (MISCELLANEOUS) ×2 IMPLANT
COVER MAYO STAND STRL (DRAPES) ×2 IMPLANT
COVER SURGICAL LIGHT HANDLE (MISCELLANEOUS) ×4 IMPLANT
DRAPE LAPAROSCOPIC ABDOMINAL (DRAPES) ×2 IMPLANT
DRAPE PROXIMA HALF (DRAPES) ×2 IMPLANT
DRAPE UTILITY XL STRL (DRAPES) ×2 IMPLANT
DRAPE WARM FLUID 44X44 (DRAPE) ×2 IMPLANT
DRSG OPSITE POSTOP 4X10 (GAUZE/BANDAGES/DRESSINGS) IMPLANT
DRSG OPSITE POSTOP 4X8 (GAUZE/BANDAGES/DRESSINGS) ×2 IMPLANT
DRSG TEGADERM 2-3/8X2-3/4 SM (GAUZE/BANDAGES/DRESSINGS) ×2 IMPLANT
ELECT BLADE 6.5 EXT (BLADE) IMPLANT
ELECT CAUTERY BLADE 6.4 (BLADE) ×2 IMPLANT
ELECT REM PT RETURN 9FT ADLT (ELECTROSURGICAL) ×2
ELECTRODE REM PT RTRN 9FT ADLT (ELECTROSURGICAL) ×1 IMPLANT
GAUZE SPONGE 2X2 8PLY NS (GAUZE/BANDAGES/DRESSINGS) ×2 IMPLANT
GLOVE BIO SURGEON STRL SZ 6 (GLOVE) ×2 IMPLANT
GLOVE BIOGEL PI IND STRL 6.5 (GLOVE) ×1 IMPLANT
GLOVE BIOGEL PI IND STRL 7.5 (GLOVE) ×2 IMPLANT
GLOVE BIOGEL PI INDICATOR 6.5 (GLOVE) ×1
GLOVE BIOGEL PI INDICATOR 7.5 (GLOVE) ×2
GLOVE SURG SS PI 7.0 STRL IVOR (GLOVE) ×4 IMPLANT
GOWN STRL REUS W/ TWL LRG LVL3 (GOWN DISPOSABLE) ×3 IMPLANT
GOWN STRL REUS W/TWL LRG LVL3 (GOWN DISPOSABLE) ×3
KIT BASIN OR (CUSTOM PROCEDURE TRAY) ×2 IMPLANT
KIT OSTOMY DRAINABLE 2.75 STR (WOUND CARE) ×2 IMPLANT
KIT ROOM TURNOVER OR (KITS) ×2 IMPLANT
LEGGING LITHOTOMY PAIR STRL (DRAPES) IMPLANT
LIGASURE IMPACT 36 18CM CVD LR (INSTRUMENTS) ×2 IMPLANT
NS IRRIG 1000ML POUR BTL (IV SOLUTION) ×4 IMPLANT
PACK GENERAL/GYN (CUSTOM PROCEDURE TRAY) ×2 IMPLANT
PAD ARMBOARD 7.5X6 YLW CONV (MISCELLANEOUS) ×2 IMPLANT
PENCIL BUTTON HOLSTER BLD 10FT (ELECTRODE) ×2 IMPLANT
RELOAD PROXIMATE 75MM BLUE (ENDOMECHANICALS) ×2 IMPLANT
SPONGE LAP 18X18 X RAY DECT (DISPOSABLE) IMPLANT
STAPLER PROXIMATE 75MM BLUE (STAPLE) ×2 IMPLANT
STAPLER VISISTAT 35W (STAPLE) ×2 IMPLANT
SUCTION POOLE TIP (SUCTIONS) ×2 IMPLANT
SURGILUBE 2OZ TUBE FLIPTOP (MISCELLANEOUS) IMPLANT
SUT PDS AB 1 TP1 96 (SUTURE) ×4 IMPLANT
SUT PDS AB 3-0 SH 27 (SUTURE) ×2 IMPLANT
SUT PROLENE 2 0 CT2 30 (SUTURE) IMPLANT
SUT PROLENE 2 0 KS (SUTURE) IMPLANT
SUT SILK 2 0 SH CR/8 (SUTURE) ×2 IMPLANT
SUT SILK 2 0 TIES 10X30 (SUTURE) ×2 IMPLANT
SUT SILK 3 0 SH CR/8 (SUTURE) ×2 IMPLANT
SUT SILK 3 0 TIES 10X30 (SUTURE) ×2 IMPLANT
SUT VIC AB 3-0 SH 18 (SUTURE) IMPLANT
SYR BULB IRRIGATION 50ML (SYRINGE) ×2 IMPLANT
TOWEL OR 17X26 10 PK STRL BLUE (TOWEL DISPOSABLE) ×4 IMPLANT
TRAY FOLEY CATH 16FRSI W/METER (SET/KITS/TRAYS/PACK) IMPLANT
TRAY PROCTOSCOPIC FIBER OPTIC (SET/KITS/TRAYS/PACK) IMPLANT
TUBE CONNECTING 12X1/4 (SUCTIONS) ×2 IMPLANT
YANKAUER SUCT BULB TIP NO VENT (SUCTIONS) ×2 IMPLANT

## 2016-01-18 NOTE — Anesthesia Postprocedure Evaluation (Signed)
Anesthesia Post Note  Patient: Taylor Whitney  Procedure(s) Performed: Procedure(s) (LRB): OPEN PARTIAL COLECTOMY WITH END COLOSTOMY (N/A)  Patient location during evaluation: PACU Anesthesia Type: General Level of consciousness: awake and alert Pain management: pain level controlled Vital Signs Assessment: post-procedure vital signs reviewed and stable Respiratory status: spontaneous breathing, nonlabored ventilation, respiratory function stable and patient connected to nasal cannula oxygen Cardiovascular status: blood pressure returned to baseline and stable Postop Assessment: no signs of nausea or vomiting Anesthetic complications: no    Last Vitals:  Vitals:   01/18/16 1330 01/18/16 1345  BP: 117/79 130/78  Pulse: (!) 101 (!) 104  Resp: (!) 21 (!) 22  Temp:      Last Pain:  Vitals:   01/18/16 1259  TempSrc:   PainSc: 10-Worst pain ever                 Jowel Waltner,W. EDMOND

## 2016-01-18 NOTE — Op Note (Signed)
Preoperative diagnosis: obstructing colon mass  Postoperative diagnosis: same   Procedure: left colectomy with end colostomy Surgeon: Gurney Maxin, M.D.  Asst: Jens Som, MD  Anesthesia: general  Indications for procedure: Taylor Whitney is a 45 y.o. year old female with symptoms of nausea and vomiting. And work up she was found to have a near obstructing mass in the left colon. She under went NGT decompression, sigmoidoscopy which confirmed malignant appearing mass. We discussed these findings with her and decided to proceed with left colectomy and end colostomy  Description of procedure: The patient was brought into the operative suite. Anesthesia was administered with General endotracheal anesthesia. WHO checklist was applied. The patient was then placed in supine. The area was prepped and draped in the usual sterile fashion.  An upper midline was made. Cautery was used to dissect down through subcutaneous anus tissues and the midline fascia was identified. Fascia was entered with cautery and peritoneal cavity was entered without injury. Brief examination of the abdominal contents was performed, liver was without abnormality, there were no satellite peritoneal implants over the anterior abdominal wall and none seen over the small bowel or other portions of the colon. At this point decision was made to proceed, Bookwalter was put in place. Next the greater omentum was freed from the left abdominal wall with cautery. The white line of Toldt was dissected free using cautery. This allowed the colon to completely move towards the middle and the masses palpated at this time, and appeared to be in the mid descending colon. Next we turned our attention to transverse colon we identified the middle colics, greater omentum was freed to the left of this aspect from the colon using cautery staying close to the colon. Once as this was completed a window in the mesentery was made and the 75 mm blue  GIA stapler was used to divide transverse colon just distal to the middle colic vessels. Next the mesentery was divided using a LigaSure impact device. Prior to dissection of this area the left ureter was identified and was seen in the retroperitoneum much posterior to the mesentery divided. A portion of mesentery was kept on the specimen side. We continued dividing the mesentery distally. In the process he noticed an injury to the fourth portion of the duodenum at the ligament of Treitz. Next we turned our attention towards the sigmoid portion of the colon, and the mid to distal sigmoid we created a mesenteric window and then divided sigmoid colon at the 75 mm GIA stapler. The corners of the distal staple line were oversewn with a 2-0 Prolene leaving a 3 cm tail. We then divided the remainder of the mesentery of the specimen ascending colon.  Once the specimen was out we turned our attention towards the ligament of Treitz injury. It did appear to be a full-thickness less than 50% injury made by LigaSure. The injury was closed with a 3-0 PDS in running fashion, orthogonal to the direction of the intestine, and staying outside the visible burn on the serosa. 3-0 silk interrupted sutures were used to Lembert the repair. The repair appeared airtight and the duodenum appeared patent. Next a 47 [place] Blake drain was put into the left lateral aspect of the abdomen and brought in proximity of the duodenal repair. Drain was sutured in place.  Attention was then turned towards the proximal transverse colon. Greater omentum was further freed away from the colon enough to be able to bring the colon out for colostomy. Circular  incision was made in the right upper quadrant of the abdomen on the skin. Blunt dissection was used to dissect through Camper's and Scarpa's fascia. A cruciate incision was made through the anterior and posterior rectus fascia. The area was dilated up to greater than 3 cm diameter. Transverse colon  was brought through this fascial tract. We irrigated the abdomen and checked for any bleeding. Previous ureter was again seen to be intact and peristalsing. Greater omentum was put over place of small bowel. Fascia was closed with a 0 PDS in running fashion. Skin was closed with staples. The ostomy was then matured using Brooking on the superior-inferior left and right aspects and then sutured to the skin with 3-0 Vicryl in interrupted fashion. Ostomy appliances put in place. Dressing was put in place over the abdomen incision. Patient awoke from anesthesia prepped PACU in stable condition.  Findings: mass in the mid descending colon, full thickness injury to D4 at level of ligament of trietz, air tight repair of duodenum  Specimen: left colon  Implant: 15fr blake drain   Blood loss: 139ml  Local anesthesia:   Complications: duodenal injury  Gurney Maxin, M.D. General, Bariatric, & Minimally Invasive Surgery [facility]

## 2016-01-18 NOTE — Progress Notes (Signed)
PROGRESS NOTE    Taylor Whitney  [id number] DOB: Feb 03, 1971 DOA: 01/16/2016 PCP: No primary care provider on file.    Brief Narrative:  Taylor Whitney is a 45 y.o. female with past oral history significant for diabetes and non-Hodgkin's lymphoma presents to emergency room with complaint nausea vomiting. CT abdomen reveals proximal large bowel obstruction. Surgery and GI consulted.  NG tube placed.   Assessment & Plan:   Principal Problem:   Large bowel obstruction (HCC) Active Problems:   DM (dermatomyositis)   Depression   Electrolyte abnormality    Large bowel obstruction: s/p L colectomy w/ colostomy POD#0. Path shows adenocarcinoma.  - management per surgery - H/O consult in am  Diabetes Mellitus:A1c 6.9. Hold meformin - SSI  Hypokalemia: 2.8. Difficulty w/ repletion throughout admission. 12mEq ordered by surgical team. Mag nml  - Mag and BMP in am - 64mEq additional KCL ordered.   Depression:  - resume Effexor when taking PO     DVT prophylaxis: scd's Code Status:  Full code.  Family Communication: none at bedside.  Disposition Plan: pending further eval.    Consultants:   Surgery.    Procedures:   sigmoidoscopy 8/ 21  L colectomy w/ colostomy 8/22   Antimicrobials: none   Subjective: Wants to know when she can go home.   Objective: Vitals:   01/17/16 1948 01/18/16 0412 01/18/16 1233 01/18/16 1245  BP: 138/82 137/82 127/82   Pulse: 96 87    Resp: 19 19  20   Temp: 99.3 F (37.4 C) 99.4 F (37.4 C)    TempSrc: Oral Oral    SpO2: 98% 98%  98%  Weight:      Height:        Intake/Output Summary (Last 24 hours) at 01/18/16 1341 Last data filed at 01/18/16 1300  Gross per 24 hour  Intake             4250 ml  Output             3077 ml  Net             1173 ml   Filed Weights   01/16/16 0656 01/17/16 0542  Weight: 77.1 kg (170 lb) 75.9 kg (167 lb 5.1 oz)    Examination:  General exam: Appears calm and comfortable  on NG tube. Sleeping in PACU Respiratory system: Clear to auscultation. Respiratory effort normal. Cardiovascular system: S1 & S2 heard, RRR. No JVD, murmurs, rubs, gallops or clicks. No pedal edema. Gastrointestinal system: Hypoactive bowel sounds, abdominal wound present with surgical dressings clean dry and intact. Colostomy present with no output..  Central nervous system: Groggy. Patient still under general anesthesia. Responds slowly to commands Extremities: Tone is symmetrical throughout. Moves all extremities and coordinated fashion. Skin: No rashes, lesions or ulcers Psychiatry: Difficult to assess as patient is still waking up from general anesthesia.    Data Reviewed: I have personally reviewed following labs and imaging studies  CBC:  Recent Labs Lab 01/16/16 0720 01/17/16 0507  WBC 16.3* 12.3*  NEUTROABS 13.3*  --   HGB 12.2 9.9*  HCT 38.7 32.0*  MCV 76.9* 79.2  PLT 442* [id number]   Basic Metabolic Panel:  Recent Labs Lab 01/16/16 0720 01/16/16 1603 01/17/16 0507 01/17/16 1722 01/18/16 0528  NA 137  --  140  --  137  K 3.1*  --  3.0*  --  2.8*  CL 99*  --  109  --  102  CO2 23  --  23  --  22  GLUCOSE 175*  --  110*  --  89  BUN 10  --  6  --  6  CREATININE 0.79  --  0.66  --  0.69  CALCIUM 9.5  --  7.9*  --  8.6*  MG  --  2.5*  --  2.0  --   PHOS  --  3.8  --   --   --    GFR: Estimated Creatinine Clearance: 88.5 mL/min (by C-G formula based on SCr of 0.8 mg/dL). Liver Function Tests:  Recent Labs Lab 01/16/16 0720 01/17/16 0507  AST 17 12*  ALT 11* 9*  ALKPHOS 74 55  BILITOT 1.3* 0.9  PROT 7.5 5.6*  ALBUMIN 3.9 2.9*    Recent Labs Lab 01/16/16 0720  LIPASE 27   No results for input(s): AMMONIA in the last 168 hours. Coagulation Profile:  Recent Labs Lab 01/17/16 0507  INR 1.07   Cardiac Enzymes: No results for input(s): CKTOTAL, CKMB, CKMBINDEX, TROPONINI in the last 168 hours. BNP (last 3 results) No results for input(s): PROBNP  in the last 8760 hours. HbA1C:  Recent Labs  01/16/16 1614  HGBA1C 6.9*   CBG:  Recent Labs Lab 01/17/16 2340 01/18/16 0408 01/18/16 0739 01/18/16 0834 01/18/16 1237  GLUCAP 88 90 89 88 129*   Lipid Profile: No results for input(s): CHOL, HDL, LDLCALC, TRIG, CHOLHDL, LDLDIRECT in the last 72 hours. Thyroid Function Tests: No results for input(s): TSH, T4TOTAL, FREET4, T3FREE, THYROIDAB in the last 72 hours. Anemia Panel: No results for input(s): VITAMINB12, FOLATE, FERRITIN, TIBC, IRON, RETICCTPCT in the last 72 hours. Sepsis Labs: No results for input(s): PROCALCITON, LATICACIDVEN in the last 168 hours.  Recent Results (from the past 240 hour(s))  Surgical pcr screen     Status: None   Collection Time: 01/17/16  7:59 PM  Result Value Ref Range Status   MRSA, PCR NEGATIVE NEGATIVE Final   Staphylococcus aureus NEGATIVE NEGATIVE Final    Comment:        The Xpert SA Assay (FDA approved for NASAL specimens in patients over 13 years of age), is one component of a comprehensive surveillance program.  Test performance has been validated by [facility] for patients greater than or equal to 68 year old. It is not intended to diagnose infection nor to guide or monitor treatment.          Radiology Studies: Dg Abd 1 View  Result Date: 01/17/2016 CLINICAL DATA:  Large bowel obstruction. EXAM: ABDOMEN - 1 VIEW COMPARISON:  CT 01/16/2016.  Abdominal series 820 2017. FINDINGS: Persistent right and transverse colon mild distention. No small bowel distention. No free air. No acute bony abnormality. IMPRESSION: Persistent right and transverse colon mild distention. No small bowel distention. The Electronically Signed   By: Marcello Moores  Register   On: 01/17/2016 07:05        Scheduled Meds: . [facility] Hold] antiseptic oral rinse  7 mL Mouth Rinse q12n4p  . cefTRIAXone (ROCEPHIN)  IV  1 g Intravenous To OR  . [MAR Hold] chlorhexidine  15 mL Mouth Rinse BID  . HYDROmorphone       . HYDROmorphone      . [MAR Hold] insulin aspart  0-15 Units Subcutaneous Q4H  . morphine   Intravenous Q4H  . morphine      . potassium chloride  10 mEq Intravenous Q1 Hr x 4  . [MAR Hold] sodium chloride flush  3 mL Intravenous Q12H  Continuous Infusions: . sodium chloride 125 mL/hr at 01/17/16 2214  . lactated ringers 50 mL/hr at 01/18/16 0917     LOS: 2 days    Time spent: 30 minutes.     Karessa Onorato, Grayling Congress, MD [facility]  Pager [phone] If 7PM-7AM, please contact night-coverage [email or web address] Password [email or web address] 01/18/2016, 1:41 PM

## 2016-01-18 NOTE — Anesthesia Procedure Notes (Signed)
Procedure Name: Intubation Date/Time: 01/18/2016 10:10 AM Performed by: Myna Bright Pre-anesthesia Checklist: Patient identified, Emergency Drugs available, Suction available, Timeout performed and Patient being monitored Patient Re-evaluated:Patient Re-evaluated prior to inductionOxygen Delivery Method: Circle system utilized Preoxygenation: Pre-oxygenation with 100% oxygen Intubation Type: IV induction, Rapid sequence and Cricoid Pressure applied Laryngoscope Size: Mac and 3 Grade View: Grade II Tube type: Oral Tube size: 7.0 mm Number of attempts: 1 Airway Equipment and Method: Stylet Placement Confirmation: positive ETCO2,  CO2 detector,  breath sounds checked- equal and bilateral and ETT inserted through vocal cords under direct vision Secured at: 22 cm Tube secured with: Tape Dental Injury: Teeth and Oropharynx as per pre-operative assessment  Comments: Intubated by srna daniel huff

## 2016-01-18 NOTE — Progress Notes (Signed)
Report called to short stay. 

## 2016-01-18 NOTE — Transfer of Care (Signed)
Immediate Anesthesia Transfer of Care Note  Patient: Taylor Whitney  Procedure(s) Performed: Procedure(s): OPEN PARTIAL COLECTOMY WITH END COLOSTOMY (N/A)  Patient Location: PACU  Anesthesia Type:General  Level of Consciousness: awake, alert , oriented and patient cooperative  Airway & Oxygen Therapy: Patient Spontanous Breathing and Patient connected to nasal cannula oxygen  Post-op Assessment: Report given to RN, Post -op Vital signs reviewed and stable and Patient moving all extremities  Post vital signs: Reviewed and stable  Last Vitals:  Vitals:   01/18/16 0412 01/18/16 1233  BP: 137/82 127/82  Pulse: 87   Resp: 19   Temp: 37.4 C     Last Pain:  Vitals:   01/18/16 0412  TempSrc: Oral  PainSc:          Complications: No apparent anesthesia complications

## 2016-01-18 NOTE — Progress Notes (Signed)
Pt in OR now for colectomy. Verbal report from pathologist: Flex sigmoidoscopy biopsy of descending colon mass is adenocarcinoma.

## 2016-01-18 NOTE — Anesthesia Preprocedure Evaluation (Addendum)
Anesthesia Evaluation  Patient identified by MRN, date of birth, ID band Patient awake    Reviewed: Allergy & Precautions, H&P , NPO status , Patient's Chart, lab work & pertinent test results  Airway Mallampati: III  TM Distance: >3 FB Neck ROM: Full    Dental no notable dental hx. (+) Teeth Intact, Dental Advisory Given   Pulmonary neg pulmonary ROS,    Pulmonary exam normal breath sounds clear to auscultation       Cardiovascular negative cardio ROS   Rhythm:Regular Rate:Normal     Neuro/Psych negative neurological ROS  negative psych ROS   GI/Hepatic negative GI ROS, Neg liver ROS, Colon mass   Endo/Other  diabetes, Type 2, Oral Hypoglycemic Agents  Renal/GU negative Renal ROS  negative genitourinary   Musculoskeletal   Abdominal   Peds  Hematology negative hematology ROS (+)   Anesthesia Other Findings   Reproductive/Obstetrics negative OB ROS                            Anesthesia Physical Anesthesia Plan  ASA: II  Anesthesia Plan: General   Post-op Pain Management:    Induction: Intravenous, Rapid sequence and Cricoid pressure planned  Airway Management Planned: Oral ETT  Additional Equipment:   Intra-op Plan:   Post-operative Plan: Extubation in OR  Informed Consent: I have reviewed the patients History and Physical, chart, labs and discussed the procedure including the risks, benefits and alternatives for the proposed anesthesia with the patient or authorized representative who has indicated his/her understanding and acceptance.   Dental advisory given  Plan Discussed with: CRNA  Anesthesia Plan Comments:         Anesthesia Quick Evaluation

## 2016-01-19 ENCOUNTER — Encounter (HOSPITAL_COMMUNITY): Payer: Self-pay | Admitting: General Surgery

## 2016-01-19 LAB — GLUCOSE, CAPILLARY
GLUCOSE-CAPILLARY: 104 mg/dL — AB (ref 65–99)
GLUCOSE-CAPILLARY: 113 mg/dL — AB (ref 65–99)
GLUCOSE-CAPILLARY: 122 mg/dL — AB (ref 65–99)
Glucose-Capillary: 115 mg/dL — ABNORMAL HIGH (ref 65–99)
Glucose-Capillary: 123 mg/dL — ABNORMAL HIGH (ref 65–99)
Glucose-Capillary: 120 mg/dL — ABNORMAL HIGH (ref 65–99)

## 2016-01-19 LAB — BASIC METABOLIC PANEL
Anion gap: 8 (ref 5–15)
CO2: 23 mmol/L (ref 22–32)
CREATININE: 0.58 mg/dL (ref 0.44–1.00)
Calcium: 8 mg/dL — ABNORMAL LOW (ref 8.9–10.3)
Chloride: 104 mmol/L (ref 101–111)
GFR calc Af Amer: >60 mL/min (ref >60)
GLUCOSE: 116 mg/dL — AB (ref 65–99)
POTASSIUM: 4.1 mmol/L (ref 3.5–5.1)
SODIUM: 135 mmol/L (ref 135–145)

## 2016-01-19 MED ORDER — ENOXAPARIN SODIUM 40 MG/0.4ML ~~LOC~~ SOLN
40.0000 mg | SUBCUTANEOUS | Status: DC
  Administered 2016-01-19 – 2016-01-24 (×6): 40 mg via SUBCUTANEOUS
  Filled 2016-01-19 (×6): qty 0.4

## 2016-01-19 NOTE — Progress Notes (Signed)
Patient ID: Taylor Whitney, female   DOB: 1970/10/16, 45 y.o.   MRN: [id number]  [facility] Surgery Progress Note  1 Day Post-Op  Subjective: Pain slightly less today than yesterday. Slept well last night.  Objective: Vital signs in last 24 hours: Temp:  [97.5 F (36.4 C)-98.6 F (37 C)] 98.1 F (36.7 C) (08/23 0403) Pulse Rate:  [89-105] 89 (08/23 0403) Resp:  [18-25] 18 (08/23 0403) BP: (117-159)/(75-89) 139/85 (08/23 0403) SpO2:  [94 %-99 %] 95 % (08/23 0403) Last BM Date:  (PTA)  Intake/Output from previous day: 08/22 0701 - 08/23 0700 In: 2503 [I.V.:2503] Out: 2097 [Urine:1575; Emesis/NG output:200; Drains:172; Blood:150] Intake/Output this shift: No intake/output data recorded.  PE: Gen:  Alert, NAD, pleasant Card:  RRR Pulm:  CTAB Abd: Soft, slight distension, appropriately tender, +BS, incision site covered with honeycomb dressing with some dried blood noted on dressing, ostomy with no output in bag  Lab Results:   Recent Labs  01/17/16 0507  WBC 12.3*  HGB 9.9*  HCT 32.0*  PLT 311   BMET  Recent Labs  01/18/16 0528 01/19/16 0606  NA 137 135  K 2.8* 4.1  CL 102 104  CO2 22 23  GLUCOSE 89 116*  BUN 6 <5*  CREATININE 0.69 0.58  CALCIUM 8.6* 8.0*   PT/INR  Recent Labs  01/17/16 0507  LABPROT 14.0  INR 1.07   CMP     Component Value Date/Time   NA 135 01/19/2016 0606   K 4.1 01/19/2016 0606   CL 104 01/19/2016 0606   CO2 23 01/19/2016 0606   GLUCOSE 116 (H) 01/19/2016 0606   BUN <5 (L) 01/19/2016 0606   CREATININE 0.58 01/19/2016 0606   CALCIUM 8.0 (L) 01/19/2016 0606   PROT 5.6 (L) 01/17/2016 0507   ALBUMIN 2.9 (L) 01/17/2016 0507   AST 12 (L) 01/17/2016 0507   ALT 9 (L) 01/17/2016 0507   ALKPHOS 55 01/17/2016 0507   BILITOT 0.9 01/17/2016 0507   GFRNONAA >60 01/19/2016 0606   GFRAA >60 01/19/2016 0606   Lipase     Component Value Date/Time   LIPASE 27 01/16/2016 0720       Studies/Results: No results  found.  Anti-infectives: Anti-infectives    Start     Dose/Rate Route Frequency Ordered Stop   01/19/16 0900  cefTRIAXone (ROCEPHIN) 1 g in dextrose 5 % 50 mL IVPB     1 g 100 mL/hr over 30 Minutes Intravenous  Once 01/18/16 1435     01/18/16 1030  cefTRIAXone (ROCEPHIN) 1 g in dextrose 5 % 50 mL IVPB  Status:  Discontinued     1 g 100 mL/hr over 30 Minutes Intravenous To Surgery 01/18/16 1023 01/18/16 1431       Assessment/Plan S/p left colectomy with end colostomy 01/18/16  - surgical pathology pending. Verbal report from pathologist: Flex sigmoidoscopy biopsy of descending colon mass is adenocarcinoma - d/c foley - continue NG tube. 200cc output yesterday - continue PCA for pain control Hypokalemia - resolved today, recheck tomorrow DM - hold metformin, SSI Depression - resume effexor when taking PO FEN - NPO, NGT VTE - SCD's Plan - pain control   LOS: 3 days    Jerrye Beavers , [facility] Surgery 01/19/2016, 7:40 AM Pager: [phone] Consults: [phone] Mon-Fri 7:00 am-4:30 pm Sat-Sun 7:00 am-11:30 am

## 2016-01-19 NOTE — Consult Note (Addendum)
[place] Nurse ostomy follow up Surgical team following for assessment and plan of care to abd wound. Stoma type/location:  Pt had surgery yesterday for colostomy placement to RLQ Stomal assessment/size: Stoma red and viable when visualized through pouch, which is intact with a good seal, appears to be flush with skin level. Output: Scant amt pink drainage; no stool or flatus  Ostomy pouching: 2pc.  Education provided: Educational materials left at bedside and supplies ordered to the room.  Will begin pouch change demonstrations when pt is feeling better this week. Enrolled patient in [place] Start Discharge program: No Julien Girt MSN, [place], [place] Sam, [place]

## 2016-01-19 NOTE — Progress Notes (Signed)
PROGRESS NOTE    Taylor Whitney  [id number] DOB: [birth year] DOA: 01/16/2016 PCP: Lynne Logan, MD    Brief Narrative:  Taylor Whitney is a 45 y.o. female with past oral history significant for diabetes and non-Hodgkin's lymphoma presents to emergency room with complaint nausea vomiting. CT abdomen reveals proximal large bowel obstruction. Surgery and GI consulted.  NG tube placed. s/p L colectomy w/ colostomy Now with post op ileus  Assessment & Plan:   AdenoCA colon with Large bowel obstruction:  -s/p L colectomy w/ colostomy 8/22, now POD#1. - Path shows adenocarcinoma, Path staging is T3N1 - management per surgery, now with post op ileus - will need Oncology FU  Diabetes Mellitus:A1c 6.9. Hold meformin - SSI, stable  Hypokalemia: 2.8. Difficulty w/ repletion throughout admission.  -corrected  Depression:  - resume Effexor when taking PO   DVT prophylaxis: resume lovenox Code Status:  Full code.  Family Communication: none at bedside.  Disposition Plan: home when ileus resolved    Consultants:   Surgery.    Procedures:   sigmoidoscopy 8/ 21  L colectomy w/ colostomy 8/22   Antimicrobials: none   Subjective: Feels ok, abd pain controlled, no air/flatus in ostomy bag yet  Objective: Vitals:   01/19/16 0403 01/19/16 0809 01/19/16 1015 01/19/16 1238  BP: 139/85  128/71   Pulse: 89  78   Resp: 18 (!) 22 19 (!) 25  Temp: 98.1 F (36.7 C)  98.5 F (36.9 C)   TempSrc: Oral  Oral   SpO2: 95% 96% 98% 98%  Weight:      Height:        Intake/Output Summary (Last 24 hours) at 01/19/16 1518 Last data filed at 01/19/16 1257  Gross per 24 hour  Intake             1253 ml  Output             1900 ml  Net             -647 ml   Filed Weights   01/16/16 0656 01/17/16 0542  Weight: 77.1 kg (170 lb) 75.9 kg (167 lb 5.1 oz)    Examination:  General exam: Appears calm and comfortable, AAOx3, NG tube in situ Respiratory system: Clear to  auscultation. Respiratory effort normal. Cardiovascular system: S1 & S2 heard, RRR. No JVD, murmurs, rubs, gallops or clicks. No pedal edema. Gastrointestinal system: Hypoactive bowel sounds, abdominal wound present with surgical dressings clean dry and intact. Colostomy present with no output..  Extremities: Tone is symmetrical throughout. Moves all extremities and coordinated fashion. Skin: No rashes, lesions or ulcers Psychiatry: flat affect    Data Reviewed: I have personally reviewed following labs and imaging studies  CBC:  Recent Labs Lab 01/16/16 0720 01/17/16 0507  WBC 16.3* 12.3*  NEUTROABS 13.3*  --   HGB 12.2 9.9*  HCT 38.7 32.0*  MCV 76.9* 79.2  PLT 442* [id number]   Basic Metabolic Panel:  Recent Labs Lab 01/16/16 0720 01/16/16 1603 01/17/16 0507 01/17/16 1722 01/18/16 0528 01/19/16 0606  NA 137  --  140  --  137 135  K 3.1*  --  3.0*  --  2.8* 4.1  CL 99*  --  109  --  102 104  CO2 23  --  23  --  22 23  GLUCOSE 175*  --  110*  --  89 116*  BUN 10  --  6  --  6 <5*  CREATININE 0.79  --  0.66  --  0.69 0.58  CALCIUM 9.5  --  7.9*  --  8.6* 8.0*  MG  --  2.5*  --  2.0  --   --   PHOS  --  3.8  --   --   --   --    GFR: Estimated Creatinine Clearance: 88.5 mL/min (by C-G formula based on SCr of 0.8 mg/dL). Liver Function Tests:  Recent Labs Lab 01/16/16 0720 01/17/16 0507  AST 17 12*  ALT 11* 9*  ALKPHOS 74 55  BILITOT 1.3* 0.9  PROT 7.5 5.6*  ALBUMIN 3.9 2.9*    Recent Labs Lab 01/16/16 0720  LIPASE 27   No results for input(s): AMMONIA in the last 168 hours. Coagulation Profile:  Recent Labs Lab 01/17/16 0507  INR 1.07   Cardiac Enzymes: No results for input(s): CKTOTAL, CKMB, CKMBINDEX, TROPONINI in the last 168 hours. BNP (last 3 results) No results for input(s): PROBNP in the last 8760 hours. HbA1C:  Recent Labs  01/16/16 1614  HGBA1C 6.9*   CBG:  Recent Labs Lab 01/18/16 2003 01/19/16 0014 01/19/16 0359  01/19/16 0743 01/19/16 1216  GLUCAP 122* 113* 115* 123* 104*   Lipid Profile: No results for input(s): CHOL, HDL, LDLCALC, TRIG, CHOLHDL, LDLDIRECT in the last 72 hours. Thyroid Function Tests: No results for input(s): TSH, T4TOTAL, FREET4, T3FREE, THYROIDAB in the last 72 hours. Anemia Panel: No results for input(s): VITAMINB12, FOLATE, FERRITIN, TIBC, IRON, RETICCTPCT in the last 72 hours. Sepsis Labs: No results for input(s): PROCALCITON, LATICACIDVEN in the last 168 hours.  Recent Results (from the past 240 hour(s))  Surgical pcr screen     Status: None   Collection Time: 01/17/16  7:59 PM  Result Value Ref Range Status   MRSA, PCR NEGATIVE NEGATIVE Final   Staphylococcus aureus NEGATIVE NEGATIVE Final    Comment:        The Xpert SA Assay (FDA approved for NASAL specimens in patients over 58 years of age), is one component of a comprehensive surveillance program.  Test performance has been validated by [facility] for patients greater than or equal to 33 year old. It is not intended to diagnose infection nor to guide or monitor treatment.          Radiology Studies: No results found.      Scheduled Meds: . antiseptic oral rinse  7 mL Mouth Rinse q12n4p  . chlorhexidine  15 mL Mouth Rinse BID  . insulin aspart  0-15 Units Subcutaneous Q4H  . morphine   Intravenous Q4H  . sodium chloride flush  3 mL Intravenous Q12H   Continuous Infusions: . sodium chloride 125 mL/hr at 01/19/16 0509  . lactated ringers 50 mL/hr at 01/18/16 0917     LOS: 3 days    Time spent: 30 minutes.     Domenic Polite, MD [facility]  Pager [phone] If 7PM-7AM, please contact night-coverage [email or web address] Password [facility] 01/19/2016, 3:18 PM

## 2016-01-19 NOTE — Progress Notes (Signed)
Offered bath to pt and pt. Stated ,aybe tomorrow. However I stated to the pt. That we had to remove the foley and I went ahead and washed her face and gave her a peri care . I changed her gown as well.

## 2016-01-20 LAB — CBC
HCT: 29.2 % — ABNORMAL LOW (ref 36.0–46.0)
Hemoglobin: 8.8 g/dL — ABNORMAL LOW (ref 12.0–15.0)
MCH: 24.1 pg — AB (ref 26.0–34.0)
MCHC: 30.1 g/dL (ref 30.0–36.0)
MCV: 80 fL (ref 78.0–100.0)
PLATELETS: 326 10*3/uL (ref 150–400)
RBC: 3.65 MIL/uL — ABNORMAL LOW (ref 3.87–5.11)
RDW: 15.5 % (ref 11.5–15.5)
WBC: 13 10*3/uL — AB (ref 4.0–10.5)

## 2016-01-20 LAB — GLUCOSE, CAPILLARY
GLUCOSE-CAPILLARY: 101 mg/dL — AB (ref 65–99)
GLUCOSE-CAPILLARY: 102 mg/dL — AB (ref 65–99)
GLUCOSE-CAPILLARY: 98 mg/dL (ref 65–99)
GLUCOSE-CAPILLARY: 98 mg/dL (ref 65–99)
Glucose-Capillary: 114 mg/dL — ABNORMAL HIGH (ref 65–99)
Glucose-Capillary: 87 mg/dL (ref 65–99)

## 2016-01-20 LAB — BASIC METABOLIC PANEL
Anion gap: 9 (ref 5–15)
CALCIUM: 8.2 mg/dL — AB (ref 8.9–10.3)
CHLORIDE: 99 mmol/L — AB (ref 101–111)
CO2: 24 mmol/L (ref 22–32)
CREATININE: 0.6 mg/dL (ref 0.44–1.00)
GFR calc Af Amer: >60 mL/min (ref >60)
GFR calc non Af Amer: >60 mL/min (ref >60)
Glucose, Bld: 106 mg/dL — ABNORMAL HIGH (ref 65–99)
Potassium: 3.5 mmol/L (ref 3.5–5.1)
SODIUM: 132 mmol/L — AB (ref 135–145)

## 2016-01-20 NOTE — Progress Notes (Signed)
PROGRESS NOTE    Taylor Whitney  [id number] DOB: 1970/07/25 DOA: 01/16/2016 PCP: Lynne Logan, MD    Brief Narrative:  Taylor Whitney is a 45 y.o. female with past oral history significant for diabetes and non-Hodgkin's lymphoma presents to emergency room with complaint nausea vomiting. CT abdomen reveals proximal large bowel obstruction. Surgery and GI consulted.  NG tube placed. s/p L colectomy w/ colostomy Now with post op ileus  Assessment & Plan:   AdenoCA colon with Large bowel obstruction:  -s/p L colectomy w/ colostomy 8/22, now POD#1. - Path shows adenocarcinoma, Path staging is T3N1 - management per surgery, now with post op ileus - NGT, IVF, NPO - will need Oncology FU  Diabetes Mellitus:A1c 6.9. Hold meformin - SSI, stable  Hypokalemia: 2.8. Difficulty w/ repletion throughout admission.  -corrected  Depression:  - resume Effexor when taking PO   DVT prophylaxis:  lovenox Code Status:  Full code.  Family Communication: none at bedside.  Disposition Plan: home when ileus resolved    Consultants:   Surgery.    Procedures:   sigmoidoscopy 8/ 21  L colectomy w/ colostomy 8/22   Antimicrobials: none   Subjective: Feels ok, abd pain controlled, no air/flatus in ostomy bag yet  Objective: Vitals:   01/20/16 0800 01/20/16 0812 01/20/16 1141 01/20/16 1200  BP:  110/65    Pulse:  99 99   Resp: 20 14 16 18   Temp:  98.2 F (36.8 C) 98.4 F (36.9 C)   TempSrc:  Oral Oral   SpO2: 97% 100% 98% 97%  Weight:      Height:        Intake/Output Summary (Last 24 hours) at 01/20/16 1259 Last data filed at 01/20/16 1100  Gross per 24 hour  Intake             1900 ml  Output             2850 ml  Net             -950 ml   Filed Weights   01/16/16 0656 01/17/16 0542 01/20/16 0621  Weight: 77.1 kg (170 lb) 75.9 kg (167 lb 5.1 oz) 74.6 kg (164 lb 7.3 oz)    Examination:  General exam: Appears calm and comfortable, AAOx3, NG tube in  situ Respiratory system: Clear to auscultation. Respiratory effort normal. Cardiovascular system: S1 & S2 heard, RRR. No JVD, murmurs, rubs, gallops or clicks. No pedal edema. Gastrointestinal system: Hypoactive bowel sounds, abdominal wound present with surgical dressings clean dry and intact. Colostomy present with no output..  Extremities: Tone is symmetrical throughout. Moves all extremities and coordinated fashion. Skin: No rashes, lesions or ulcers Psychiatry: flat affect    Data Reviewed: I have personally reviewed following labs and imaging studies  CBC:  Recent Labs Lab 01/16/16 0720 01/17/16 0507 01/20/16 0455  WBC 16.3* 12.3* 13.0*  NEUTROABS 13.3*  --   --   HGB 12.2 9.9* 8.8*  HCT 38.7 32.0* 29.2*  MCV 76.9* 79.2 80.0  PLT 442* 311 [id number]   Basic Metabolic Panel:  Recent Labs Lab 01/16/16 0720 01/16/16 1603 01/17/16 0507 01/17/16 1722 01/18/16 0528 01/19/16 0606 01/20/16 0455  NA 137  --  140  --  137 135 132*  K 3.1*  --  3.0*  --  2.8* 4.1 3.5  CL 99*  --  109  --  102 104 99*  CO2 23  --  23  --  22 23 24  GLUCOSE 175*  --  110*  --  89 116* 106*  BUN 10  --  6  --  6 <5* <5*  CREATININE 0.79  --  0.66  --  0.69 0.58 0.60  CALCIUM 9.5  --  7.9*  --  8.6* 8.0* 8.2*  MG  --  2.5*  --  2.0  --   --   --   PHOS  --  3.8  --   --   --   --   --    GFR: Estimated Creatinine Clearance: 87.7 mL/min (by C-G formula based on SCr of 0.8 mg/dL). Liver Function Tests:  Recent Labs Lab 01/16/16 0720 01/17/16 0507  AST 17 12*  ALT 11* 9*  ALKPHOS 74 55  BILITOT 1.3* 0.9  PROT 7.5 5.6*  ALBUMIN 3.9 2.9*    Recent Labs Lab 01/16/16 0720  LIPASE 27   No results for input(s): AMMONIA in the last 168 hours. Coagulation Profile:  Recent Labs Lab 01/17/16 0507  INR 1.07   Cardiac Enzymes: No results for input(s): CKTOTAL, CKMB, CKMBINDEX, TROPONINI in the last 168 hours. BNP (last 3 results) No results for input(s): PROBNP in the last 8760  hours. HbA1C: No results for input(s): HGBA1C in the last 72 hours. CBG:  Recent Labs Lab 01/19/16 2027 01/20/16 0032 01/20/16 0400 01/20/16 0747 01/20/16 1108  GLUCAP 122* 101* 114* 98 102*   Lipid Profile: No results for input(s): CHOL, HDL, LDLCALC, TRIG, CHOLHDL, LDLDIRECT in the last 72 hours. Thyroid Function Tests: No results for input(s): TSH, T4TOTAL, FREET4, T3FREE, THYROIDAB in the last 72 hours. Anemia Panel: No results for input(s): VITAMINB12, FOLATE, FERRITIN, TIBC, IRON, RETICCTPCT in the last 72 hours. Sepsis Labs: No results for input(s): PROCALCITON, LATICACIDVEN in the last 168 hours.  Recent Results (from the past 240 hour(s))  Surgical pcr screen     Status: None   Collection Time: 01/17/16  7:59 PM  Result Value Ref Range Status   MRSA, PCR NEGATIVE NEGATIVE Final   Staphylococcus aureus NEGATIVE NEGATIVE Final    Comment:        The Xpert SA Assay (FDA approved for NASAL specimens in patients over 63 years of age), is one component of a comprehensive surveillance program.  Test performance has been validated by [facility] for patients greater than or equal to 1 year old. It is not intended to diagnose infection nor to guide or monitor treatment.          Radiology Studies: No results found.      Scheduled Meds: . antiseptic oral rinse  7 mL Mouth Rinse q12n4p  . chlorhexidine  15 mL Mouth Rinse BID  . enoxaparin (LOVENOX) injection  40 mg Subcutaneous Q24H  . insulin aspart  0-15 Units Subcutaneous Q4H  . morphine   Intravenous Q4H  . sodium chloride flush  3 mL Intravenous Q12H   Continuous Infusions: . lactated ringers 50 mL/hr at 01/20/16 0622     LOS: 4 days    Time spent: 30 minutes.     Domenic Polite, MD [facility]  Pager [phone] If 7PM-7AM, please contact night-coverage [email or web address] Password [facility] 01/20/2016, 12:59 PM

## 2016-01-20 NOTE — Progress Notes (Signed)
Patient ID: SUZONNE NAND, female   DOB: 06-19-1970, 45 y.o.   MRN: [id number]  [facility] Surgery Progress Note  2 Days Post-Op  Subjective: Feeling about the same as yesterday. Pain is well controlled. No flatus.  Objective: Vital signs in last 24 hours: Temp:  [97.8 F (36.6 C)-99.3 F (37.4 C)] 99.3 F (37.4 C) (08/24 0621) Pulse Rate:  [78-101] 96 (08/24 0621) Resp:  [17-25] 17 (08/24 0621) BP: (122-131)/(69-78) 122/69 (08/24 0621) SpO2:  [90 %-100 %] 100 % (08/24 0621) Weight:  [164 lb 7.3 oz (74.6 kg)] 164 lb 7.3 oz (74.6 kg) (08/24 0621) Last BM Date: 01/16/16  Intake/Output from previous day: 08/23 0701 - 08/24 0700 In: 1900 [I.V.:1900] Out: 3130 [Urine:1950; Emesis/NG output:850; Drains:80; Stool:250] Intake/Output this shift: No intake/output data recorded.  PE: Gen:  Alert, NAD, pleasant Card:  RRR Pulm:  CTAB Abd: Soft, slight distension, appropriately tender, +BS, incision site covered with honeycomb dressing with some dried blood noted on dressing, ostomy with no output in bag and JP drain with nearly no output   Lab Results:   Recent Labs  01/20/16 0455  WBC 13.0*  HGB 8.8*  HCT 29.2*  PLT 326   BMET  Recent Labs  01/19/16 0606 01/20/16 0455  NA 135 132*  K 4.1 3.5  CL 104 99*  CO2 23 24  GLUCOSE 116* 106*  BUN <5* <5*  CREATININE 0.58 0.60  CALCIUM 8.0* 8.2*   PT/INR No results for input(s): LABPROT, INR in the last 72 hours. CMP     Component Value Date/Time   NA 132 (L) 01/20/2016 0455   K 3.5 01/20/2016 0455   CL 99 (L) 01/20/2016 0455   CO2 24 01/20/2016 0455   GLUCOSE 106 (H) 01/20/2016 0455   BUN <5 (L) 01/20/2016 0455   CREATININE 0.60 01/20/2016 0455   CALCIUM 8.2 (L) 01/20/2016 0455   PROT 5.6 (L) 01/17/2016 0507   ALBUMIN 2.9 (L) 01/17/2016 0507   AST 12 (L) 01/17/2016 0507   ALT 9 (L) 01/17/2016 0507   ALKPHOS 55 01/17/2016 0507   BILITOT 0.9 01/17/2016 0507   GFRNONAA >60 01/20/2016 0455   GFRAA  >60 01/20/2016 0455   Lipase     Component Value Date/Time   LIPASE 27 01/16/2016 0720       Studies/Results: No results found.  Anti-infectives: Anti-infectives    Start     Dose/Rate Route Frequency Ordered Stop   01/19/16 0900  cefTRIAXone (ROCEPHIN) 1 g in dextrose 5 % 50 mL IVPB     1 g 100 mL/hr over 30 Minutes Intravenous  Once 01/18/16 1435 01/19/16 0957   01/18/16 1030  cefTRIAXone (ROCEPHIN) 1 g in dextrose 5 % 50 mL IVPB  Status:  Discontinued     1 g 100 mL/hr over 30 Minutes Intravenous To Surgery 01/18/16 1023 01/18/16 1431       Assessment/Plan S/p left colectomy with end colostomy 01/18/16  - surgical pathology pending, colonoscopy path reports adenocarcinoma - continue NG tube. 56cc output yesterday - continue PCA for pain control Hypokalemia - resolved DM - hold metformin, SSI Depression - resume effexor when taking PO FEN - NPO, NGT VTE - SCD's, lovenox Plan - swallow study tomorrow. Waiting on surgical path.    LOS: 4 days    Jerrye Beavers , [facility] Surgery 01/20/2016, 7:36 AM Pager: [phone] Consults: [phone] Mon-Fri 7:00 am-4:30 pm Sat-Sun 7:00 am-11:30 am

## 2016-01-20 NOTE — Consult Note (Signed)
[place] Nurse ostomy follow up Stoma type/location: Colostomy stoma to RLQ; current pouch was leaking behind barrier when removed. Stomal assessment/size: 1 1/2 inches, red and viable Peristomal assessment: intact skin surrounding  Output: No stool or flatus at this time  Ostomy pouching: 1pc  Education provided:  Demonstrated pouch change using one piece convex pouch and a barrier ring to assist with maintaining a seal. Pt was able to open and close with velcro to open and close when emptying.  Reviewed pouching routines and ordering supplies.  Pt asked appropriate questions and could benefit from home health assistance after discharge. Extra supplies in the room. Enrolled patient in [place] Start Discharge program: Yes Julien Girt MSN, RN, [place], [place], [place]

## 2016-01-21 ENCOUNTER — Inpatient Hospital Stay (HOSPITAL_COMMUNITY): Payer: BLUE CROSS/BLUE SHIELD

## 2016-01-21 LAB — BASIC METABOLIC PANEL
ANION GAP: 12 (ref 5–15)
CALCIUM: 8.5 mg/dL — AB (ref 8.9–10.3)
CO2: 28 mmol/L (ref 22–32)
Chloride: 96 mmol/L — ABNORMAL LOW (ref 101–111)
Creatinine, Ser: 0.55 mg/dL (ref 0.44–1.00)
GFR calc Af Amer: >60 mL/min (ref >60)
Glucose, Bld: 85 mg/dL (ref 65–99)
POTASSIUM: 3.5 mmol/L (ref 3.5–5.1)
SODIUM: 136 mmol/L (ref 135–145)

## 2016-01-21 LAB — GLUCOSE, CAPILLARY
GLUCOSE-CAPILLARY: 86 mg/dL (ref 65–99)
GLUCOSE-CAPILLARY: 86 mg/dL (ref 65–99)
GLUCOSE-CAPILLARY: 93 mg/dL (ref 65–99)
Glucose-Capillary: 93 mg/dL (ref 65–99)
Glucose-Capillary: 93 mg/dL (ref 65–99)
Glucose-Capillary: 96 mg/dL (ref 65–99)

## 2016-01-21 MED ORDER — IOPAMIDOL (ISOVUE-300) INJECTION 61%
INTRAVENOUS | Status: AC
  Filled 2016-01-21: qty 150

## 2016-01-21 MED ORDER — IOPAMIDOL (ISOVUE-300) INJECTION 61%
150.0000 mL | Freq: Once | INTRAVENOUS | Status: AC | PRN
  Administered 2016-01-21: 150 mL

## 2016-01-21 MED ORDER — MORPHINE SULFATE (PF) 2 MG/ML IV SOLN
1.0000 mg | INTRAVENOUS | Status: DC | PRN
  Administered 2016-01-22 (×2): 1 mg via INTRAVENOUS
  Filled 2016-01-21: qty 1

## 2016-01-21 NOTE — Progress Notes (Signed)
Patient ID: Taylor Whitney, female   DOB: 1970-11-26, 45 y.o.   MRN: [id number] Stool output from colostomy. UGI reviewed and D/W Dr. Kieth Brightly. D/C NGT and allow ice chips. Georganna Skeans, MD, MPH, FACS Trauma: [phone] General Surgery: [phone]

## 2016-01-21 NOTE — Care Management Note (Signed)
Case Management Note  Patient Details  Name: EZLYNN COBO MRN: [id number] Date of Birth: 01-17-1971  Subjective/Objective:    s/p left colectomy w/ colostomy 01/18/2016               Action/Plan: Discharge Planning:  NCM spoke to pt and offered choice for [facility]. Pt requested [facility] for [facility] RN. Pt will dc home with few colostomy bags. States her sister, Lattie Haw and Mickel Baas will be at home to assist with care.   PCP Donald Prose  MD  Expected Discharge Date:                 Expected Discharge Plan:  [place]  In-House Referral:  NA  Discharge planning Services  CM Consult  Post Acute Care Choice:  Home Health Choice offered to:  Patient  DME Arranged:  N/A DME Agency:  NA  HH Arranged:  RN [place] Agency:  [place]  Status of Service:  In process, will continue to follow  If discussed at Long Length of Stay Meetings, dates discussed:    Additional Comments:  Erenest Rasher, RN 01/21/2016, 4:51 PM

## 2016-01-21 NOTE — Progress Notes (Signed)
Patient ID: Taylor Whitney, female   DOB: 09-06-1970, 45 y.o.   MRN: [id number]  [facility] Surgery Progress Note  3 Days Post-Op  Subjective: Feeling about the same as yesterday, did some walking. Pain is well controlled. No flatus or stool.  Objective: Vital signs in last 24 hours: Temp:  [98.4 F (36.9 C)-98.7 F (37.1 C)] 98.4 F (36.9 C) (08/25 0443) Pulse Rate:  [99] 99 (08/25 0443) Resp:  [16-22] 22 (08/25 0807) BP: (118-132)/(63-82) 132/63 (08/25 0443) SpO2:  [96 %-98 %] 97 % (08/25 0807) Last BM Date: 01/16/16  Intake/Output from previous day: 08/24 0701 - 08/25 0700 In: 650 [I.V.:650] Out: 3405 [Urine:2250; Emesis/NG output:1100; Drains:55] Intake/Output this shift: No intake/output data recorded.  PE: Gen:  Alert, NAD, pleasant Card:  RRR Pulm:  CTAB Abd: Soft, slight distension, appropriately tender, +BS, incision site covered with honeycomb dressing with some dried blood noted on dressing, ostomy with no output in bag and JP drain with serosanguinous output   Lab Results:   Recent Labs  01/20/16 0455  WBC 13.0*  HGB 8.8*  HCT 29.2*  PLT 326   BMET  Recent Labs  01/20/16 0455 01/21/16 0608  NA 132* 136  K 3.5 3.5  CL 99* 96*  CO2 24 28  GLUCOSE 106* 85  BUN <5* <5*  CREATININE 0.60 0.55  CALCIUM 8.2* 8.5*   PT/INR No results for input(s): LABPROT, INR in the last 72 hours. CMP     Component Value Date/Time   NA 136 01/21/2016 0608   K 3.5 01/21/2016 0608   CL 96 (L) 01/21/2016 0608   CO2 28 01/21/2016 0608   GLUCOSE 85 01/21/2016 0608   BUN <5 (L) 01/21/2016 0608   CREATININE 0.55 01/21/2016 0608   CALCIUM 8.5 (L) 01/21/2016 0608   PROT 5.6 (L) 01/17/2016 0507   ALBUMIN 2.9 (L) 01/17/2016 0507   AST 12 (L) 01/17/2016 0507   ALT 9 (L) 01/17/2016 0507   ALKPHOS 55 01/17/2016 0507   BILITOT 0.9 01/17/2016 0507   GFRNONAA >60 01/21/2016 0608   GFRAA >60 01/21/2016 0608   Lipase     Component Value Date/Time   LIPASE 27 01/16/2016 0720       Studies/Results: No results found.  Anti-infectives: Anti-infectives    Start     Dose/Rate Route Frequency Ordered Stop   01/19/16 0900  cefTRIAXone (ROCEPHIN) 1 g in dextrose 5 % 50 mL IVPB     1 g 100 mL/hr over 30 Minutes Intravenous  Once 01/18/16 1435 01/19/16 0957   01/18/16 1030  cefTRIAXone (ROCEPHIN) 1 g in dextrose 5 % 50 mL IVPB  Status:  Discontinued     1 g 100 mL/hr over 30 Minutes Intravenous To Surgery 01/18/16 1023 01/18/16 1431     Path:  Diagnosis Colon, segmental resection for tumor, Left - INVASIVE ADENOCARCINOMA, WELL DIFFERENTIATED, SPANNING 4.1 CM. - ADENOCARCINOMA EXTENDS INTO PERICOLONIC SOFT TISSUE. - LYMPHOVASCULAR INVASION IS IDENTIFIED. - THE SURGICAL RESECTION MARGINS ARE NEGATIVE FOR CARCINOMA. - METASTATIC CARCINOMA IN 1 OF 27 LYMPH NODES (1/27). - DIVERTICULAR DISEASE (GROSS DIAGNOSIS).  Assessment/Plan S/p left colectomy with end colostomy 01/18/16  - surgical pathology T3N1 adenoca.  - continue NG tube. 72cc output yesterday - continue PCA for pain control - UGI today Hypokalemia - resolved DM - hold metformin, SSI Depression - resume effexor when taking PO FEN - NPO, NGT VTE - SCD's, lovenox Plan - UGI today. Will need oncology follow up.    LOS:  5 days    Clovis Riley  01/21/2016, 8:16 AM

## 2016-01-21 NOTE — Progress Notes (Signed)
PROGRESS NOTE    Taylor Whitney  [id number] DOB: [birth year] DOA: 01/16/2016 PCP: Lynne Logan, MD    Brief Narrative:  Taylor Whitney is a 45 y.o. female with past oral history significant for diabetes and non-Hodgkin's lymphoma presents to emergency room with complaint nausea vomiting. CT abdomen reveals proximal large bowel obstruction. Surgery and GI consulted.  NG tube placed. s/p L colectomy w/ colostomy Continues to have post op ileus  Assessment & Plan:   AdenoCA colon with Large bowel obstruction:  -s/p L colectomy w/ colostomy 8/22, now POD#3 - Path shows adenocarcinoma, Path staging is T3N1 - management per surgery, now with post op ileus - NGT, IVF, NPO - will need Oncology FU - not using PCA, will use PRN morphine  Diabetes Mellitus:A1c 6.9. Hold meformin - SSI, stable  Hypokalemia: 2.8. Difficulty w/ repletion throughout admission.  -corrected  Depression:  - resume Effexor when taking PO   DVT prophylaxis:  lovenox Code Status:  Full code.  Family Communication: none at bedside.  Disposition Plan: home when ileus resolved   Consultants:   Surgery.    Procedures:   sigmoidoscopy 8/ 21  L colectomy w/ colostomy 8/22   Antimicrobials: none   Subjective: Not using PCA, wants NG out, no air/flatus in ostomy bag yet  Objective: Vitals:   01/21/16 0443 01/21/16 0807 01/21/16 0920 01/21/16 1111  BP: 132/63  130/82   Pulse: 99  84   Resp: 19 (!) 22 20 (!) 22  Temp: 98.4 F (36.9 C)  98.4 F (36.9 C)   TempSrc: Oral  Oral   SpO2: 98% 97% 96% 99%  Weight:      Height:        Intake/Output Summary (Last 24 hours) at 01/21/16 1223 Last data filed at 01/21/16 0900  Gross per 24 hour  Intake              650 ml  Output             3055 ml  Net            -2405 ml   Filed Weights   01/16/16 0656 01/17/16 0542 01/20/16 0621  Weight: 77.1 kg (170 lb) 75.9 kg (167 lb 5.1 oz) 74.6 kg (164 lb 7.3 oz)     Examination:  General exam: Appears calm and comfortable, AAOx3, NG tube in situ Respiratory system: Clear to auscultation. Respiratory effort normal. Cardiovascular system: S1 & S2 heard, RRR. No JVD, murmurs, rubs, gallops or clicks. No pedal edema. Gastrointestinal system: Hypoactive bowel sounds, abdominal wound present with surgical dressings clean dry and intact. Colostomy present with no output..  Extremities: Tone is symmetrical throughout. Moves all extremities and coordinated fashion. Skin: No rashes, lesions or ulcers Psychiatry: flat affect    Data Reviewed: I have personally reviewed following labs and imaging studies  CBC:  Recent Labs Lab 01/16/16 0720 01/17/16 0507 01/20/16 0455  WBC 16.3* 12.3* 13.0*  NEUTROABS 13.3*  --   --   HGB 12.2 9.9* 8.8*  HCT 38.7 32.0* 29.2*  MCV 76.9* 79.2 80.0  PLT 442* 311 [id number]   Basic Metabolic Panel:  Recent Labs Lab 01/16/16 1603 01/17/16 0507 01/17/16 1722 01/18/16 0528 01/19/16 0606 01/20/16 0455 01/21/16 0608  NA  --  140  --  137 135 132* 136  K  --  3.0*  --  2.8* 4.1 3.5 3.5  CL  --  109  --  102 104 99* 96*  CO2  --  23  --  22 23 24 28   GLUCOSE  --  110*  --  89 116* 106* 85  BUN  --  6  --  6 <5* <5* <5*  CREATININE  --  0.66  --  0.69 0.58 0.60 0.55  CALCIUM  --  7.9*  --  8.6* 8.0* 8.2* 8.5*  MG 2.5*  --  2.0  --   --   --   --   PHOS 3.8  --   --   --   --   --   --    GFR: Estimated Creatinine Clearance: 87.7 mL/min (by C-G formula based on SCr of 0.8 mg/dL). Liver Function Tests:  Recent Labs Lab 01/16/16 0720 01/17/16 0507  AST 17 12*  ALT 11* 9*  ALKPHOS 74 55  BILITOT 1.3* 0.9  PROT 7.5 5.6*  ALBUMIN 3.9 2.9*    Recent Labs Lab 01/16/16 0720  LIPASE 27   No results for input(s): AMMONIA in the last 168 hours. Coagulation Profile:  Recent Labs Lab 01/17/16 0507  INR 1.07   Cardiac Enzymes: No results for input(s): CKTOTAL, CKMB, CKMBINDEX, TROPONINI in the last 168  hours. BNP (last 3 results) No results for input(s): PROBNP in the last 8760 hours. HbA1C: No results for input(s): HGBA1C in the last 72 hours. CBG:  Recent Labs Lab 01/20/16 2010 01/21/16 0014 01/21/16 0421 01/21/16 0740 01/21/16 1114  GLUCAP 98 93 93 93 86   Lipid Profile: No results for input(s): CHOL, HDL, LDLCALC, TRIG, CHOLHDL, LDLDIRECT in the last 72 hours. Thyroid Function Tests: No results for input(s): TSH, T4TOTAL, FREET4, T3FREE, THYROIDAB in the last 72 hours. Anemia Panel: No results for input(s): VITAMINB12, FOLATE, FERRITIN, TIBC, IRON, RETICCTPCT in the last 72 hours. Sepsis Labs: No results for input(s): PROCALCITON, LATICACIDVEN in the last 168 hours.  Recent Results (from the past 240 hour(s))  Surgical pcr screen     Status: None   Collection Time: 01/17/16  7:59 PM  Result Value Ref Range Status   MRSA, PCR NEGATIVE NEGATIVE Final   Staphylococcus aureus NEGATIVE NEGATIVE Final    Comment:        The Xpert SA Assay (FDA approved for NASAL specimens in patients over 43 years of age), is one component of a comprehensive surveillance program.  Test performance has been validated by [facility] for patients greater than or equal to 26 year old. It is not intended to diagnose infection nor to guide or monitor treatment.          Radiology Studies: Dg Ugi W/water Sol Cm  Result Date: 01/21/2016 CLINICAL DATA:  Assess for leak in the fourth portion of the duodenum. Duodenotomy during tumor resection, on 01/18/2016, repaired. EXAM: WATER SOLUBLE UPPER GI SERIES TECHNIQUE: Single-column upper GI series was performed using water soluble contrast. CONTRAST:  150 cc Omnipaque 300 COMPARISON:  None. FLUOROSCOPY TIME:  Radiation Exposure Index (as provided by the fluoroscopic device): If the device does not provide the exposure index: Fluoroscopy Time (in minutes and seconds):  2 minutes come 0 seconds Number of Acquired Images:  2 FINDINGS: 150 cc of  water-soluble contrast was instilled via the nasogastric tube. On the KUB, a left flank drain is also in place and skin clips from laparotomy are noted. I was able to the turned the patient up on her right side in order to facilitate dumping of contrast medium into the duodenum. I would in turn the patient up on the left  side in order to facilitate transfer of the duodenal contrast into the distal duodenum. I obtain fluoroscopic spot images at numerous degrees of rotation as I turned the patient back towards her back. There does appear to be some slight narrowing of the fourth portion of the duodenum for example on series 24, but I perceive no leak from the duodenum. Contrast medium extends into the jejunum. I obtained a follow up KUB after the fluoroscopic portion of the exam and it demonstrates no funneling of contrast outside of bowel in the vicinity of the prior duodenotomy. IMPRESSION: 1. No leak identified. There is some slight narrowing of the junction of the duodenum and jejunum in the vicinity of the prior duodenotomy repair. 2. Today' s exam was performed with water-soluble contrast, and is optimized specifically to assess the distal duodenum. Electronically Signed   By: Van Clines M.D.   On: 01/21/2016 11:18        Scheduled Meds: . antiseptic oral rinse  7 mL Mouth Rinse q12n4p  . chlorhexidine  15 mL Mouth Rinse BID  . enoxaparin (LOVENOX) injection  40 mg Subcutaneous Q24H  . insulin aspart  0-15 Units Subcutaneous Q4H  . iopamidol      . sodium chloride flush  3 mL Intravenous Q12H   Continuous Infusions: . lactated ringers 50 mL/hr at 01/21/16 0728     LOS: 5 days    Time spent: 30 minutes.     Domenic Polite, MD [facility]  Pager [phone] If 7PM-7AM, please contact night-coverage [email or web address] Password [facility] 01/21/2016, 12:23 PM

## 2016-01-21 NOTE — Consult Note (Signed)
[place] Nurse ostomy follow up Stoma type/location: Colostomy stoma to RLQ; pouch was changed yesterday and is intact with good seal. Stomal assessment/size:  red and viable when visualized through pouch Output: No stool or flatus at this time; pt states she "felt some air in the bag" and she still has an NG in place  Ostomy pouching: 1pc  Education provided: Educational materials left at bedside and extra supplies in the room. Pt asked appropriate questions and could benefit from home health assistance after discharge. Will perform another educational session on Monday.  Enrolled patient in [facility] Discharge program: Yes Julien Girt MSN, RN, [place], [place], [place]

## 2016-01-22 ENCOUNTER — Inpatient Hospital Stay (HOSPITAL_COMMUNITY): Payer: BLUE CROSS/BLUE SHIELD

## 2016-01-22 LAB — GLUCOSE, CAPILLARY
GLUCOSE-CAPILLARY: 168 mg/dL — AB (ref 65–99)
GLUCOSE-CAPILLARY: 84 mg/dL (ref 65–99)
GLUCOSE-CAPILLARY: 91 mg/dL (ref 65–99)
Glucose-Capillary: 99 mg/dL (ref 65–99)
Glucose-Capillary: 133 mg/dL — ABNORMAL HIGH (ref 65–99)

## 2016-01-22 MED ORDER — HYDROMORPHONE HCL 1 MG/ML IJ SOLN
0.5000 mg | INTRAMUSCULAR | Status: DC | PRN
  Administered 2016-01-22: 0.5 mg via INTRAVENOUS
  Filled 2016-01-22: qty 1

## 2016-01-22 MED ORDER — SODIUM CHLORIDE 0.9 % IV SOLN
INTRAVENOUS | Status: DC
  Administered 2016-01-22 – 2016-01-23 (×2): via INTRAVENOUS

## 2016-01-22 MED ORDER — KETOROLAC TROMETHAMINE 15 MG/ML IJ SOLN
15.0000 mg | INTRAMUSCULAR | Status: AC | PRN
  Administered 2016-01-22: 15 mg via INTRAVENOUS
  Filled 2016-01-22: qty 1

## 2016-01-22 MED ORDER — PROMETHAZINE HCL 25 MG/ML IJ SOLN
12.5000 mg | Freq: Four times a day (QID) | INTRAMUSCULAR | Status: DC | PRN
  Administered 2016-01-22 (×2): 12.5 mg via INTRAVENOUS
  Filled 2016-01-22 (×2): qty 1

## 2016-01-22 NOTE — Progress Notes (Signed)
No issues overnight. NG removed following negative UGI yesterday. Denies nausea. Pain well controlled. Colostomy with stool output.   Vitals:   01/21/16 2100 01/22/16 0545  BP: 137/77 (!) 141/85  Pulse: 88 89  Resp: 19 19  Temp: 98 F (36.7 C) 99.1 F (37.3 C)   CBC Latest Ref Rng & Units 01/20/2016 01/17/2016 01/16/2016  WBC 4.0 - 10.5 K/uL 13.0(H) 12.3(H) 16.3(H)  Hemoglobin 12.0 - 15.0 g/dL 8.8(L) 9.9(L) 12.2  Hematocrit 36.0 - 46.0 % 29.2(L) 32.0(L) 38.7  Platelets 150 - 400 K/uL 326 311 442(H)   BMP Latest Ref Rng & Units 01/21/2016 01/20/2016 01/19/2016  Glucose 65 - 99 mg/dL 85 106(H) 116(H)  BUN 6 - 20 mg/dL <5(L) <5(L) <5(L)  Creatinine 0.44 - 1.00 mg/dL 0.55 0.60 0.58  Sodium 135 - 145 mmol/L 136 132(L) 135  Potassium 3.5 - 5.1 mmol/L 3.5 3.5 4.1  Chloride 101 - 111 mmol/L 96(L) 99(L) 104  CO2 22 - 32 mmol/L 28 24 23   Calcium 8.9 - 10.3 mg/dL 8.5(L) 8.2(L) 8.0(L)     Intake/Output Summary (Last 24 hours) at 01/22/16 0958 Last data filed at 01/22/16 0517  Gross per 24 hour  Intake          1284.17 ml  Output             1945 ml  Net          -660.83 ml   Stool: 900 Jp: 45 serosang UOP 1400  Aox3 no distress Unlabored/RRR Abdomen soft, appropriately tender. JP with serous output. Incision c/d/i. RUQ colostomy with brown stool in bag.  Ext Wwp, no edema  A/P: POD 4 s/p left hemicolectomy with end colostomy, repair of duodenum -Sips of clears today -OOB/Amb -Start teaching with colostomy care

## 2016-01-22 NOTE — Progress Notes (Addendum)
PROGRESS NOTE    Taylor Whitney  [id number] DOB: 06-Apr-1971 DOA: 01/16/2016 PCP: Lynne Logan, MD    Brief Narrative:  Taylor Whitney is a 45 y.o. female with past oral history significant for diabetes and non-Hodgkin's lymphoma presents to emergency room with complaint nausea vomiting. CT abdomen reveals proximal large bowel obstruction. Surgery and GI consulted.  NG tube placed. s/p L colectomy w/ colostomy post op ileus starting to improve  Assessment & Plan:   AdenoCA colon with Large bowel obstruction:  -s/p L colectomy w/ colostomy 8/22, now POD#4 - Path shows adenocarcinoma, Path staging is T3N1 - Post op ileus improving - NGT removed 8/25, clears today, ambulate, Dced PCA - will need Oncology FU  Diabetes Mellitus:A1c 6.9. Hold meformin - SSI, stable  Hypokalemia: 2.8. Difficulty w/ repletion throughout admission.  -corrected  Depression:  - resume Effexor when taking PO   DVT prophylaxis:  lovenox Code Status:  Full code.  Family Communication: none at bedside.  Disposition Plan: home in 1-2days   Consultants:   Surgery.    Procedures:   sigmoidoscopy 8/ 21  L colectomy w/ colostomy 8/22   Antimicrobials: none   Subjective: Started clears, NG out, stool in ostomy yesterday  Objective: Vitals:   01/21/16 1111 01/21/16 1335 01/21/16 2100 01/22/16 0545  BP:  140/80 137/77 (!) 141/85  Pulse:  82 88 89  Resp: (!) 22 20 19 19   Temp:  98.4 F (36.9 C) 98 F (36.7 C) 99.1 F (37.3 C)  TempSrc:  Oral Oral Oral  SpO2: 99% 97% 98% 100%  Weight:      Height:        Intake/Output Summary (Last 24 hours) at 01/22/16 1242 Last data filed at 01/22/16 0900  Gross per 24 hour  Intake          1344.17 ml  Output             2045 ml  Net          -700.83 ml   Filed Weights   01/16/16 0656 01/17/16 0542 01/20/16 0621  Weight: 77.1 kg (170 lb) 75.9 kg (167 lb 5.1 oz) 74.6 kg (164 lb 7.3 oz)    Examination:  General exam: Appears  calm and comfortable, AAOx3, NG tube in situ Respiratory system: Clear to auscultation. Respiratory effort normal. Cardiovascular system: S1 & S2 heard, RRR. No JVD, murmurs, rubs, gallops or clicks. No pedal edema. Gastrointestinal system: Hypoactive bowel sounds, abdominal wound present with surgical dressings clean dry and intact. Colostomy present with no output..  Extremities: Tone is symmetrical throughout. Moves all extremities and coordinated fashion. Skin: No rashes, lesions or ulcers Psychiatry: flat affect    Data Reviewed: I have personally reviewed following labs and imaging studies  CBC:  Recent Labs Lab 01/16/16 0720 01/17/16 0507 01/20/16 0455  WBC 16.3* 12.3* 13.0*  NEUTROABS 13.3*  --   --   HGB 12.2 9.9* 8.8*  HCT 38.7 32.0* 29.2*  MCV 76.9* 79.2 80.0  PLT 442* 311 [id number]   Basic Metabolic Panel:  Recent Labs Lab 01/16/16 1603 01/17/16 0507 01/17/16 1722 01/18/16 0528 01/19/16 0606 01/20/16 0455 01/21/16 0608  NA  --  140  --  137 135 132* 136  K  --  3.0*  --  2.8* 4.1 3.5 3.5  CL  --  109  --  102 104 99* 96*  CO2  --  23  --  22 23 24 28   GLUCOSE  --  110*  --  89 116* 106* 85  BUN  --  6  --  6 <5* <5* <5*  CREATININE  --  0.66  --  0.69 0.58 0.60 0.55  CALCIUM  --  7.9*  --  8.6* 8.0* 8.2* 8.5*  MG 2.5*  --  2.0  --   --   --   --   PHOS 3.8  --   --   --   --   --   --    GFR: Estimated Creatinine Clearance: 87.7 mL/min (by C-G formula based on SCr of 0.8 mg/dL). Liver Function Tests:  Recent Labs Lab 01/16/16 0720 01/17/16 0507  AST 17 12*  ALT 11* 9*  ALKPHOS 74 55  BILITOT 1.3* 0.9  PROT 7.5 5.6*  ALBUMIN 3.9 2.9*    Recent Labs Lab 01/16/16 0720  LIPASE 27   No results for input(s): AMMONIA in the last 168 hours. Coagulation Profile:  Recent Labs Lab 01/17/16 0507  INR 1.07   Cardiac Enzymes: No results for input(s): CKTOTAL, CKMB, CKMBINDEX, TROPONINI in the last 168 hours. BNP (last 3 results) No results for  input(s): PROBNP in the last 8760 hours. HbA1C: No results for input(s): HGBA1C in the last 72 hours. CBG:  Recent Labs Lab 01/21/16 1601 01/21/16 2007 01/22/16 0019 01/22/16 0741 01/22/16 1120  GLUCAP 86 96 91 84 99   Lipid Profile: No results for input(s): CHOL, HDL, LDLCALC, TRIG, CHOLHDL, LDLDIRECT in the last 72 hours. Thyroid Function Tests: No results for input(s): TSH, T4TOTAL, FREET4, T3FREE, THYROIDAB in the last 72 hours. Anemia Panel: No results for input(s): VITAMINB12, FOLATE, FERRITIN, TIBC, IRON, RETICCTPCT in the last 72 hours. Sepsis Labs: No results for input(s): PROCALCITON, LATICACIDVEN in the last 168 hours.  Recent Results (from the past 240 hour(s))  Surgical pcr screen     Status: None   Collection Time: 01/17/16  7:59 PM  Result Value Ref Range Status   MRSA, PCR NEGATIVE NEGATIVE Final   Staphylococcus aureus NEGATIVE NEGATIVE Final    Comment:        The Xpert SA Assay (FDA approved for NASAL specimens in patients over 38 years of age), is one component of a comprehensive surveillance program.  Test performance has been validated by [facility] for patients greater than or equal to 48 year old. It is not intended to diagnose infection nor to guide or monitor treatment.          Radiology Studies: Dg Ugi W/water Sol Cm  Result Date: 01/21/2016 CLINICAL DATA:  Assess for leak in the fourth portion of the duodenum. Duodenotomy during tumor resection, on 01/18/2016, repaired. EXAM: WATER SOLUBLE UPPER GI SERIES TECHNIQUE: Single-column upper GI series was performed using water soluble contrast. CONTRAST:  150 cc Omnipaque 300 COMPARISON:  None. FLUOROSCOPY TIME:  Radiation Exposure Index (as provided by the fluoroscopic device): If the device does not provide the exposure index: Fluoroscopy Time (in minutes and seconds):  2 minutes come 0 seconds Number of Acquired Images:  2 FINDINGS: 150 cc of water-soluble contrast was instilled via the  nasogastric tube. On the KUB, a left flank drain is also in place and skin clips from laparotomy are noted. I was able to the turned the patient up on her right side in order to facilitate dumping of contrast medium into the duodenum. I would in turn the patient up on the left side in order to facilitate transfer of the duodenal contrast into the distal  duodenum. I obtain fluoroscopic spot images at numerous degrees of rotation as I turned the patient back towards her back. There does appear to be some slight narrowing of the fourth portion of the duodenum for example on series 24, but I perceive no leak from the duodenum. Contrast medium extends into the jejunum. I obtained a follow up KUB after the fluoroscopic portion of the exam and it demonstrates no funneling of contrast outside of bowel in the vicinity of the prior duodenotomy. IMPRESSION: 1. No leak identified. There is some slight narrowing of the junction of the duodenum and jejunum in the vicinity of the prior duodenotomy repair. 2. Today' s exam was performed with water-soluble contrast, and is optimized specifically to assess the distal duodenum. Electronically Signed   By: Van Clines M.D.   On: 01/21/2016 11:18        Scheduled Meds: . chlorhexidine  15 mL Mouth Rinse BID  . enoxaparin (LOVENOX) injection  40 mg Subcutaneous Q24H  . insulin aspart  0-15 Units Subcutaneous Q4H  . mouth rinse  7 mL Mouth Rinse q12n4p  . sodium chloride flush  3 mL Intravenous Q12H   Continuous Infusions: . lactated ringers 50 mL/hr at 01/22/16 0240     LOS: 6 days    Time spent: 30 minutes.     Domenic Polite, MD [facility]  Pager [phone] If 7PM-7AM, please contact night-coverage [email or web address] Password [facility] 01/22/2016, 12:42 PM

## 2016-01-23 LAB — CBC
HCT: 36.9 % (ref 36.0–46.0)
HEMOGLOBIN: 11.6 g/dL — AB (ref 12.0–15.0)
MCH: 24.1 pg — AB (ref 26.0–34.0)
MCHC: 31.4 g/dL (ref 30.0–36.0)
MCV: 76.7 fL — AB (ref 78.0–100.0)
Platelets: 531 10*3/uL — ABNORMAL HIGH (ref 150–400)
RBC: 4.81 MIL/uL (ref 3.87–5.11)
RDW: 15.4 % (ref 11.5–15.5)
WBC: 14.8 10*3/uL — ABNORMAL HIGH (ref 4.0–10.5)

## 2016-01-23 LAB — GLUCOSE, CAPILLARY
GLUCOSE-CAPILLARY: 96 mg/dL (ref 65–99)
GLUCOSE-CAPILLARY: 116 mg/dL — AB (ref 65–99)
GLUCOSE-CAPILLARY: 122 mg/dL — AB (ref 65–99)
Glucose-Capillary: 115 mg/dL — ABNORMAL HIGH (ref 65–99)
Glucose-Capillary: 145 mg/dL — ABNORMAL HIGH (ref 65–99)
Glucose-Capillary: 173 mg/dL — ABNORMAL HIGH (ref 65–99)

## 2016-01-23 LAB — BASIC METABOLIC PANEL
ANION GAP: 10 (ref 5–15)
BUN: 12 mg/dL (ref 6–20)
CHLORIDE: 104 mmol/L (ref 101–111)
CO2: 23 mmol/L (ref 22–32)
Calcium: 8.5 mg/dL — ABNORMAL LOW (ref 8.9–10.3)
Creatinine, Ser: 0.69 mg/dL (ref 0.44–1.00)
GFR calc non Af Amer: >60 mL/min (ref >60)
Glucose, Bld: 118 mg/dL — ABNORMAL HIGH (ref 65–99)
Potassium: 3.6 mmol/L (ref 3.5–5.1)
Sodium: 137 mmol/L (ref 135–145)

## 2016-01-23 MED ORDER — DEXTROSE-NACL 5-0.45 % IV SOLN
INTRAVENOUS | Status: DC
  Administered 2016-01-23 (×2): via INTRAVENOUS

## 2016-01-23 NOTE — Progress Notes (Signed)
PROGRESS NOTE    Taylor Whitney  [id number] DOB: [birth year] DOA: 01/16/2016 PCP: Lynne Logan, MD    Brief Narrative:  Taylor Whitney is a 45 y.o. female with past oral history significant for diabetes and non-Hodgkin's lymphoma presents to emergency room with complaint nausea vomiting. CT abdomen reveals proximal large bowel obstruction. Surgery and GI consulted.  NG tube placed. s/p L colectomy w/ colostomy post op ileus starting to improve  Assessment & Plan:   AdenoCA colon with Large bowel obstruction:  -s/p L colectomy w/ colostomy 8/22, now POD#5 - Path shows adenocarcinoma, Path staging is T3N1 - Post op ileus improving - NGT removed 8/25, ambulate, Dced PCA - vomited x3 yesterday, trial of ice chips and sips today - will need Oncology FU  Diabetes Mellitus:A1c 6.9. Hold meformin - SSI, stable  Hypokalemia: 2.8. Difficulty w/ repletion throughout admission.  -corrected  Depression:  - resume Effexor when stable taking PO-tomorrow   DVT prophylaxis:  lovenox Code Status:  Full code.  Family Communication: none at bedside.  Disposition Plan: home in 1-2days if continues to improve   Consultants:   Surgery.    Procedures:   sigmoidoscopy 8/ 21  L colectomy w/ colostomy 8/22   Antimicrobials: none   Subjective: Feels better this am, vomited x3 yesterday  Objective: Vitals:   01/22/16 0545 01/22/16 1417 01/22/16 2131 01/23/16 0529  BP: (!) 141/85 (!) 150/100 107/81 112/69  Pulse: 89 88 (!) 101 92  Resp: 19 19 16 16   Temp: 99.1 F (37.3 C) 98.9 F (37.2 C) 98.5 F (36.9 C) 98.4 F (36.9 C)  TempSrc: Oral Oral Oral   SpO2: 100% 100% 100% 100%  Weight:    74.4 kg (164 lb)  Height:        Intake/Output Summary (Last 24 hours) at 01/23/16 1114 Last data filed at 01/23/16 0845  Gross per 24 hour  Intake           1202.5 ml  Output              895 ml  Net            307.5 ml   Filed Weights   01/17/16 0542 01/20/16 0621  01/23/16 0529  Weight: 75.9 kg (167 lb 5.1 oz) 74.6 kg (164 lb 7.3 oz) 74.4 kg (164 lb)    Examination:  General exam: Appears calm and comfortable, AAOx3 Respiratory system: Clear to auscultation. Respiratory effort normal. Cardiovascular system: S1 & S2 heard, RRR. No JVD, murmurs, rubs, gallops or clicks. No pedal edema. Gastrointestinal system: Hypoactive bowel sounds, abdominal wound present with surgical dressings clean dry and intact. Colostomy present with no output..  Extremities: Tone is symmetrical throughout. Moves all extremities and coordinated fashion. Skin: No rashes, lesions or ulcers Psychiatry: flat affect    Data Reviewed: I have personally reviewed following labs and imaging studies  CBC:  Recent Labs Lab 01/17/16 0507 01/20/16 0455 01/23/16 0221  WBC 12.3* 13.0* 14.8*  HGB 9.9* 8.8* 11.6*  HCT 32.0* 29.2* 36.9  MCV 79.2 80.0 76.7*  PLT 311 326 0000000*   Basic Metabolic Panel:  Recent Labs Lab 01/16/16 1603  01/17/16 1722 01/18/16 0528 01/19/16 0606 01/20/16 0455 01/21/16 0608 01/23/16 0221  NA  --   < >  --  137 135 132* 136 137  K  --   < >  --  2.8* 4.1 3.5 3.5 3.6  CL  --   < >  --  102 104  99* 96* 104  CO2  --   < >  --  22 23 24 28 23   GLUCOSE  --   < >  --  89 116* 106* 85 118*  BUN  --   < >  --  6 <5* <5* <5* 12  CREATININE  --   < >  --  0.69 0.58 0.60 0.55 0.69  CALCIUM  --   < >  --  8.6* 8.0* 8.2* 8.5* 8.5*  MG 2.5*  --  2.0  --   --   --   --   --   PHOS 3.8  --   --   --   --   --   --   --   < > = values in this interval not displayed. GFR: Estimated Creatinine Clearance: 87.6 mL/min (by C-G formula based on SCr of 0.8 mg/dL). Liver Function Tests:  Recent Labs Lab 01/17/16 0507  AST 12*  ALT 9*  ALKPHOS 55  BILITOT 0.9  PROT 5.6*  ALBUMIN 2.9*   No results for input(s): LIPASE, AMYLASE in the last 168 hours. No results for input(s): AMMONIA in the last 168 hours. Coagulation Profile:  Recent Labs Lab  01/17/16 0507  INR 1.07   Cardiac Enzymes: No results for input(s): CKTOTAL, CKMB, CKMBINDEX, TROPONINI in the last 168 hours. BNP (last 3 results) No results for input(s): PROBNP in the last 8760 hours. HbA1C: No results for input(s): HGBA1C in the last 72 hours. CBG:  Recent Labs Lab 01/22/16 1611 01/22/16 2023 01/23/16 0013 01/23/16 0409 01/23/16 0800  GLUCAP 133* 168* 145* 96 116*   Lipid Profile: No results for input(s): CHOL, HDL, LDLCALC, TRIG, CHOLHDL, LDLDIRECT in the last 72 hours. Thyroid Function Tests: No results for input(s): TSH, T4TOTAL, FREET4, T3FREE, THYROIDAB in the last 72 hours. Anemia Panel: No results for input(s): VITAMINB12, FOLATE, FERRITIN, TIBC, IRON, RETICCTPCT in the last 72 hours. Sepsis Labs: No results for input(s): PROCALCITON, LATICACIDVEN in the last 168 hours.  Recent Results (from the past 240 hour(s))  Surgical pcr screen     Status: None   Collection Time: 01/17/16  7:59 PM  Result Value Ref Range Status   MRSA, PCR NEGATIVE NEGATIVE Final   Staphylococcus aureus NEGATIVE NEGATIVE Final    Comment:        The Xpert SA Assay (FDA approved for NASAL specimens in patients over 3 years of age), is one component of a comprehensive surveillance program.  Test performance has been validated by [facility] for patients greater than or equal to 83 year old. It is not intended to diagnose infection nor to guide or monitor treatment.          Radiology Studies: Dg Abd 1 View  Result Date: 01/22/2016 CLINICAL DATA:  Evaluate ileus EXAM: ABDOMEN - 1 VIEW COMPARISON:  01/21/2016 FINDINGS: There is a surgical drain overlying the left hemi abdomen. The nasogastric tube is been removed. No dilated loops of small or large bowel identified. Enteric contrast material is identified within right lower quadrant bowel loops. IMPRESSION: 1. No abnormal bowel dilatation identified. Electronically Signed   By: Kerby Moors M.D.   On:  01/22/2016 19:24        Scheduled Meds: . chlorhexidine  15 mL Mouth Rinse BID  . enoxaparin (LOVENOX) injection  40 mg Subcutaneous Q24H  . insulin aspart  0-15 Units Subcutaneous Q4H  . mouth rinse  7 mL Mouth Rinse q12n4p  . sodium chloride  flush  3 mL Intravenous Q12H   Continuous Infusions: . dextrose 5 % and 0.45% NaCl 100 mL/hr at 01/23/16 0842     LOS: 7 days    Time spent: 30 minutes.     Domenic Polite, MD [facility]  Pager [phone] If 7PM-7AM, please contact night-coverage [email or web address] Password [email or web address] 01/23/2016, 11:14 AM

## 2016-01-23 NOTE — Progress Notes (Signed)
Emesis yesterday with trial of clears. Pain well controlled. Colostomy with stool output.   Vitals:   01/22/16 2131 01/23/16 0529  BP: 107/81 112/69  Pulse: (!) 101 92  Resp: 16 16  Temp: 98.5 F (36.9 C) 98.4 F (36.9 C)   CBC Latest Ref Rng & Units 01/23/2016 01/20/2016 01/17/2016  WBC 4.0 - 10.5 K/uL 14.8(H) 13.0(H) 12.3(H)  Hemoglobin 12.0 - 15.0 g/dL 11.6(L) 8.8(L) 9.9(L)  Hematocrit 36.0 - 46.0 % 36.9 29.2(L) 32.0(L)  Platelets 150 - 400 K/uL 531(H) 326 311   BMP Latest Ref Rng & Units 01/23/2016 01/21/2016 01/20/2016  Glucose 65 - 99 mg/dL 118(H) 85 106(H)  BUN 6 - 20 mg/dL 12 <5(L) <5(L)  Creatinine 0.44 - 1.00 mg/dL 0.69 0.55 0.60  Sodium 135 - 145 mmol/L 137 136 132(L)  Potassium 3.5 - 5.1 mmol/L 3.6 3.5 3.5  Chloride 101 - 111 mmol/L 104 96(L) 99(L)  CO2 22 - 32 mmol/L 23 28 24   Calcium 8.9 - 10.3 mg/dL 8.5(L) 8.5(L) 8.2(L)     Intake/Output Summary (Last 24 hours) at 01/23/16 0814 Last data filed at 01/23/16 0530  Gross per 24 hour  Intake          1026.25 ml  Output             1635 ml  Net          -608.75 ml   Stool: 600 Jp: 30 serous UOP 1000  Aox3 no distress Unlabored/RRR Abdomen soft, appropriately tender. JP with serous output. Incision c/d/i. RUQ colostomy viable but retracted slightly.  Ext Wwp, no edema  A/P: POD 4 s/p left hemicolectomy with end colostomy, repair of duodenum -She can try limited sips today. If she continues to have emesis she will likely require TPN.  -OOB/Amb -Continue colostomy care education

## 2016-01-24 ENCOUNTER — Encounter: Payer: Self-pay | Admitting: *Deleted

## 2016-01-24 LAB — BASIC METABOLIC PANEL
ANION GAP: 10 (ref 5–15)
BUN: 8 mg/dL (ref 6–20)
CO2: 24 mmol/L (ref 22–32)
Calcium: 8.2 mg/dL — ABNORMAL LOW (ref 8.9–10.3)
Chloride: 104 mmol/L (ref 101–111)
Creatinine, Ser: 0.59 mg/dL (ref 0.44–1.00)
GFR calc Af Amer: >60 mL/min (ref >60)
GFR calc non Af Amer: >60 mL/min (ref >60)
GLUCOSE: 128 mg/dL — AB (ref 65–99)
POTASSIUM: 2.8 mmol/L — AB (ref 3.5–5.1)
Sodium: 138 mmol/L (ref 135–145)

## 2016-01-24 LAB — GLUCOSE, CAPILLARY
GLUCOSE-CAPILLARY: 104 mg/dL — AB (ref 65–99)
GLUCOSE-CAPILLARY: 151 mg/dL — AB (ref 65–99)
GLUCOSE-CAPILLARY: 76 mg/dL (ref 65–99)
Glucose-Capillary: 115 mg/dL — ABNORMAL HIGH (ref 65–99)
Glucose-Capillary: 119 mg/dL — ABNORMAL HIGH (ref 65–99)
Glucose-Capillary: 132 mg/dL — ABNORMAL HIGH (ref 65–99)
Glucose-Capillary: 133 mg/dL — ABNORMAL HIGH (ref 65–99)
Glucose-Capillary: 89 mg/dL (ref 65–99)

## 2016-01-24 LAB — CBC
HEMATOCRIT: 31.7 % — AB (ref 36.0–46.0)
HEMOGLOBIN: 9.9 g/dL — AB (ref 12.0–15.0)
MCH: 24.2 pg — AB (ref 26.0–34.0)
MCHC: 31.2 g/dL (ref 30.0–36.0)
MCV: 77.5 fL — ABNORMAL LOW (ref 78.0–100.0)
Platelets: 416 10*3/uL — ABNORMAL HIGH (ref 150–400)
RBC: 4.09 MIL/uL (ref 3.87–5.11)
RDW: 15.3 % (ref 11.5–15.5)
WBC: 10.3 10*3/uL (ref 4.0–10.5)

## 2016-01-24 MED ORDER — POTASSIUM CHLORIDE CRYS ER 20 MEQ PO TBCR
40.0000 meq | EXTENDED_RELEASE_TABLET | ORAL | Status: AC
Start: 2016-01-24 — End: 2016-01-24
  Administered 2016-01-24 (×2): 40 meq via ORAL
  Filled 2016-01-24 (×2): qty 2

## 2016-01-24 NOTE — Consult Note (Signed)
[place] Nurse ostomy follow up Stoma type/location: Colostomy stoma to RLQ;  Stomal assessment/size:  red and viable, 1 1/2 inches, oval and slightly above skin level Output: Mod amt brown semiformed stool in the pouch Ostomy pouching: 1pc  Education provided: Demonstrated pouch change using one piece pouch and barrier ring.  Pt assisted with application and is able to open and close velcro to empty. Educational materials left at bedside and 4 sets of extra supplies ordered to the room. Pt asked appropriate questions.  Reviewed pouching routines and ordering supplies.  Enrolled patient in [place] Start Discharge program: Yes Julien Girt MSN, RN, [place], [place], [place]

## 2016-01-24 NOTE — Progress Notes (Signed)
PROGRESS NOTE    Taylor Whitney  [id number] DOB: March 19, 1971 DOA: 01/16/2016 PCP: Lynne Logan, MD    Brief Narrative:  Taylor Whitney is a 45 y.o. female with past oral history significant for diabetes and non-Hodgkin's lymphoma presents to emergency room with complaint nausea vomiting. CT abdomen reveals proximal large bowel obstruction. Surgery and GI consulted.  NG tube placed. s/p L colectomy w/ colostomy post op ileus starting to improve  Assessment & Plan:   AdenoCA colon with Large bowel obstruction:  - s/p L colectomy w/ colostomy 8/22, now POD#5 - Path shows adenocarcinoma, Path staging is T3N1 - Post op ileus improving - NGT removed 8/25, ambulate, Dced PCA - tolerated clears, advance to full liq today - needs Oncology FU-called Cancer center for FU  Diabetes Mellitus:A1c 6.9. Held meformin - SSI, stable  Hypokalemia: 2.8. Difficulty w/ repletion throughout admission.  -corrected  Depression:  - resume Effexor when stable taking PO-tomorrow   DVT prophylaxis:  lovenox Code Status:  Full code.  Family Communication: sister at bedside.   Disposition Plan: home tomorrow if better   Consultants:   Surgery.    Procedures:   sigmoidoscopy 8/ 21  L colectomy w/ colostomy 8/22   Antimicrobials: none   Subjective: Feels well, no further vomiting, tolerating clears  Objective: Vitals:   01/22/16 2131 01/23/16 0529 01/23/16 2144 01/24/16 0441  BP: 107/81 112/69 129/69 108/70  Pulse: (!) 101 92 87 78  Resp: 16 16 16 16   Temp: 98.5 F (36.9 C) 98.4 F (36.9 C) 98.5 F (36.9 C) 98 F (36.7 C)  TempSrc: Oral  Oral Oral  SpO2: 100% 100% 100% 100%  Weight:  74.4 kg (164 lb)  76.1 kg (167 lb 12.8 oz)  Height:        Intake/Output Summary (Last 24 hours) at 01/24/16 1419 Last data filed at 01/24/16 0741  Gross per 24 hour  Intake             1770 ml  Output             1120 ml  Net              650 ml   Filed Weights   01/20/16  0621 01/23/16 0529 01/24/16 0441  Weight: 74.6 kg (164 lb 7.3 oz) 74.4 kg (164 lb) 76.1 kg (167 lb 12.8 oz)    Examination:  General exam: Appears calm and comfortable, AAOx3 Respiratory system: Clear to auscultation. Respiratory effort normal. Cardiovascular system: S1 & S2 heard, RRR. No JVD, murmurs, rubs, gallops or clicks. No pedal edema. Gastrointestinal system: Hypoactive bowel sounds, abdominal wound present with surgical dressings clean dry and intact. Colostomy present with no output..  Extremities: Tone is symmetrical throughout. Moves all extremities and coordinated fashion. Skin: No rashes, lesions or ulcers Psychiatry: flat affect    Data Reviewed: I have personally reviewed following labs and imaging studies  CBC:  Recent Labs Lab 01/20/16 0455 01/23/16 0221 01/24/16 0404  WBC 13.0* 14.8* 10.3  HGB 8.8* 11.6* 9.9*  HCT 29.2* 36.9 31.7*  MCV 80.0 76.7* 77.5*  PLT 326 531* 123456*   Basic Metabolic Panel:  Recent Labs Lab 01/17/16 1722  01/19/16 0606 01/20/16 0455 01/21/16 0608 01/23/16 0221 01/24/16 0404  NA  --   < > 135 132* 136 137 138  K  --   < > 4.1 3.5 3.5 3.6 2.8*  CL  --   < > 104 99* 96* 104 104  CO2  --   < >  23 24 28 23 24   GLUCOSE  --   < > 116* 106* 85 118* 128*  BUN  --   < > <5* <5* <5* 12 8  CREATININE  --   < > 0.58 0.60 0.55 0.69 0.59  CALCIUM  --   < > 8.0* 8.2* 8.5* 8.5* 8.2*  MG 2.0  --   --   --   --   --   --   < > = values in this interval not displayed. GFR: Estimated Creatinine Clearance: 88.6 mL/min (by C-G formula based on SCr of 0.8 mg/dL). Liver Function Tests: No results for input(s): AST, ALT, ALKPHOS, BILITOT, PROT, ALBUMIN in the last 168 hours. No results for input(s): LIPASE, AMYLASE in the last 168 hours. No results for input(s): AMMONIA in the last 168 hours. Coagulation Profile: No results for input(s): INR, PROTIME in the last 168 hours. Cardiac Enzymes: No results for input(s): CKTOTAL, CKMB, CKMBINDEX,  TROPONINI in the last 168 hours. BNP (last 3 results) No results for input(s): PROBNP in the last 8760 hours. HbA1C: No results for input(s): HGBA1C in the last 72 hours. CBG:  Recent Labs Lab 01/23/16 2024 01/24/16 0019 01/24/16 0422 01/24/16 0759 01/24/16 1247  GLUCAP 115* 132* 115* 133* 89   Lipid Profile: No results for input(s): CHOL, HDL, LDLCALC, TRIG, CHOLHDL, LDLDIRECT in the last 72 hours. Thyroid Function Tests: No results for input(s): TSH, T4TOTAL, FREET4, T3FREE, THYROIDAB in the last 72 hours. Anemia Panel: No results for input(s): VITAMINB12, FOLATE, FERRITIN, TIBC, IRON, RETICCTPCT in the last 72 hours. Sepsis Labs: No results for input(s): PROCALCITON, LATICACIDVEN in the last 168 hours.  Recent Results (from the past 240 hour(s))  Surgical pcr screen     Status: None   Collection Time: 01/17/16  7:59 PM  Result Value Ref Range Status   MRSA, PCR NEGATIVE NEGATIVE Final   Staphylococcus aureus NEGATIVE NEGATIVE Final    Comment:        The Xpert SA Assay (FDA approved for NASAL specimens in patients over 25 years of age), is one component of a comprehensive surveillance program.  Test performance has been validated by [facility] for patients greater than or equal to 54 year old. It is not intended to diagnose infection nor to guide or monitor treatment.          Radiology Studies: Dg Abd 1 View  Result Date: 01/22/2016 CLINICAL DATA:  Evaluate ileus EXAM: ABDOMEN - 1 VIEW COMPARISON:  01/21/2016 FINDINGS: There is a surgical drain overlying the left hemi abdomen. The nasogastric tube is been removed. No dilated loops of small or large bowel identified. Enteric contrast material is identified within right lower quadrant bowel loops. IMPRESSION: 1. No abnormal bowel dilatation identified. Electronically Signed   By: Kerby Moors M.D.   On: 01/22/2016 19:24        Scheduled Meds: . chlorhexidine  15 mL Mouth Rinse BID  . enoxaparin  (LOVENOX) injection  40 mg Subcutaneous Q24H  . insulin aspart  0-15 Units Subcutaneous Q4H  . mouth rinse  7 mL Mouth Rinse q12n4p  . sodium chloride flush  3 mL Intravenous Q12H   Continuous Infusions:     LOS: 8 days    Time spent: 30 minutes.     Domenic Polite, MD [facility]  Pager [phone] If 7PM-7AM, please contact night-coverage [email or web address] Password [email or web address] 01/24/2016, 2:19 PM

## 2016-01-24 NOTE — Progress Notes (Signed)
6 Days Post-Op  Subjective: Pt doing well with NAE Tol PO  Objective: Vital signs in last 24 hours: Temp:  [98 F (36.7 C)-98.5 F (36.9 C)] 98 F (36.7 C) (08/28 0441) Pulse Rate:  [78-87] 78 (08/28 0441) Resp:  [16] 16 (08/28 0441) BP: (108-129)/(69-70) 108/70 (08/28 0441) SpO2:  [100 %] 100 % (08/28 0441) Weight:  [76.1 kg (167 lb 12.8 oz)] 76.1 kg (167 lb 12.8 oz) (08/28 0441) Last BM Date: 01/23/16  Intake/Output from previous day: 08/27 0701 - 08/28 0700 In: 2106.3 [P.O.:200; I.V.:1906.3] Out: 1680 [Urine:700; Drains:180; Stool:800] Intake/Output this shift: Total I/O In: -  Out: 50 [Drains:40; Stool:10]  General appearance: alert and cooperative GI: soft, nt, nd, ostomy pink/patent, JP SS  Lab Results:   Recent Labs  01/23/16 0221 01/24/16 0404  WBC 14.8* 10.3  HGB 11.6* 9.9*  HCT 36.9 31.7*  PLT 531* 416*   BMET  Recent Labs  01/23/16 0221 01/24/16 0404  NA 137 138  K 3.6 2.8*  CL 104 104  CO2 23 24  GLUCOSE 118* 128*  BUN 12 8  CREATININE 0.69 0.59  CALCIUM 8.5* 8.2*   PT/INR No results for input(s): LABPROT, INR in the last 72 hours. ABG No results for input(s): PHART, HCO3 in the last 72 hours.  Invalid input(s): PCO2, PO2  Studies/Results: Dg Abd 1 View  Result Date: 01/22/2016 CLINICAL DATA:  Evaluate ileus EXAM: ABDOMEN - 1 VIEW COMPARISON:  01/21/2016 FINDINGS: There is a surgical drain overlying the left hemi abdomen. The nasogastric tube is been removed. No dilated loops of small or large bowel identified. Enteric contrast material is identified within right lower quadrant bowel loops. IMPRESSION: 1. No abnormal bowel dilatation identified. Electronically Signed   By: Kerby Moors M.D.   On: 01/22/2016 19:24    Anti-infectives: Anti-infectives    Start     Dose/Rate Route Frequency Ordered Stop   01/19/16 0900  cefTRIAXone (ROCEPHIN) 1 g in dextrose 5 % 50 mL IVPB     1 g 100 mL/hr over 30 Minutes Intravenous  Once 01/18/16  1435 01/19/16 0957   01/18/16 1030  cefTRIAXone (ROCEPHIN) 1 g in dextrose 5 % 50 mL IVPB  Status:  Discontinued     1 g 100 mL/hr over 30 Minutes Intravenous To Surgery 01/18/16 1023 01/18/16 1431      Assessment/Plan: s/p Procedure(s): OPEN PARTIAL COLECTOMY WITH END COLOSTOMY (N/A) Advance diet to [place] in next 1-2d   LOS: 8 days    Rosario Jacks., Anne Hahn 01/24/2016

## 2016-01-24 NOTE — Progress Notes (Signed)
Oncology Nurse Navigator Documentation  Oncology Nurse Navigator Flowsheets 01/24/2016  Navigator Location [facility]  Navigator Encounter Type Telephone  Telephone Outgoing Call  Abnormal Finding Date 01/17/2016  Confirmed Diagnosis Date 01/18/2016  Surgery Date 01/18/2016  Received request from HIM to call Dr. Domenic Polite regarding referral. Unable to reach physician at this time. Message forwarded to clinical new patient coordinator to schedule her to see Dr. Burr Medico on 02/01/16 at 11 or 2:30 (patient preference). Navigator will follow to ensure appointment is made.

## 2016-01-25 ENCOUNTER — Telehealth: Payer: Self-pay | Admitting: *Deleted

## 2016-01-25 LAB — BASIC METABOLIC PANEL
ANION GAP: 11 (ref 5–15)
BUN: 6 mg/dL (ref 6–20)
CALCIUM: 9.2 mg/dL (ref 8.9–10.3)
CO2: 27 mmol/L (ref 22–32)
Chloride: 101 mmol/L (ref 101–111)
Creatinine, Ser: 0.66 mg/dL (ref 0.44–1.00)
GFR calc Af Amer: >60 mL/min (ref >60)
GLUCOSE: 118 mg/dL — AB (ref 65–99)
POTASSIUM: 3.1 mmol/L — AB (ref 3.5–5.1)
SODIUM: 139 mmol/L (ref 135–145)

## 2016-01-25 LAB — GLUCOSE, CAPILLARY
GLUCOSE-CAPILLARY: 112 mg/dL — AB (ref 65–99)
GLUCOSE-CAPILLARY: 134 mg/dL — AB (ref 65–99)
Glucose-Capillary: 128 mg/dL — ABNORMAL HIGH (ref 65–99)

## 2016-01-25 MED ORDER — UNABLE TO FIND: 0 refills | Status: DC

## 2016-01-25 MED ORDER — ACETAMINOPHEN 500 MG PO TABS: 500.0000 mg | ORAL_TABLET | Freq: Four times a day (QID) | ORAL | 0 refills | Status: DC | PRN

## 2016-01-25 MED ORDER — HYDROCODONE-ACETAMINOPHEN 5-300 MG PO TABS: 1.0000 | ORAL_TABLET | Freq: Four times a day (QID) | ORAL | 0 refills | Status: DC | PRN

## 2016-01-25 NOTE — Progress Notes (Signed)
[facility] Progress Note  7 Days Post-Op  Subjective: Denies abdominal pain. Only mild soreness around midline incision site. Tolerating full liquid diet. Ambulating. Urinating without hesitancy. Liquid stool and gas in ostomy pouch.  Objective: Vital signs in last 24 hours: Temp:  [98.4 F (36.9 C)-99.3 F (37.4 C)] 98.4 F (36.9 C) (08/29 0544) Pulse Rate:  [59-98] 59 (08/29 0544) Resp:  [16-17] 16 (08/29 0544) BP: (117-138)/(75-89) 128/89 (08/29 0544) SpO2:  [94 %-100 %] 94 % (08/29 0544) Weight:  [77.5 kg (170 lb 14.4 oz)] 77.5 kg (170 lb 14.4 oz) (08/29 0544) Last BM Date: 01/24/16  Intake/Output from previous day: 08/28 0701 - 08/29 0700 In: 31 [P.O.:780] Out: 1610 [Urine:700; Drains:100; Stool:810] Intake/Output this shift: No intake/output data recorded.  PE: Gen:  Alert, NAD, pleasant Abd: Soft, appropriately tender, ND, +BS, LLQ drain with minimal sanguinous drainage, midline staples c/d/i, ostomy pouch with liquid stool, stoma pink and viable.  Lab Results:   Recent Labs  01/23/16 0221 01/24/16 0404  WBC 14.8* 10.3  HGB 11.6* 9.9*  HCT 36.9 31.7*  PLT 531* 416*   BMET  Recent Labs  01/23/16 0221 01/24/16 0404  NA 137 138  K 3.6 2.8*  CL 104 104  CO2 23 24  GLUCOSE 118* 128*  BUN 12 8  CREATININE 0.69 0.59  CALCIUM 8.5* 8.2*   PT/INR No results for input(s): LABPROT, INR in the last 72 hours. CMP     Component Value Date/Time   NA 138 01/24/2016 0404   K 2.8 (L) 01/24/2016 0404   CL 104 01/24/2016 0404   CO2 24 01/24/2016 0404   GLUCOSE 128 (H) 01/24/2016 0404   BUN 8 01/24/2016 0404   CREATININE 0.59 01/24/2016 0404   CALCIUM 8.2 (L) 01/24/2016 0404   PROT 5.6 (L) 01/17/2016 0507   ALBUMIN 2.9 (L) 01/17/2016 0507   AST 12 (L) 01/17/2016 0507   ALT 9 (L) 01/17/2016 0507   ALKPHOS 55 01/17/2016 0507   BILITOT 0.9 01/17/2016 0507   GFRNONAA >60 01/24/2016 0404   GFRAA >60 01/24/2016 0404   Lipase     Component  Value Date/Time   LIPASE 27 01/16/2016 0720    Studies/Results: No results found.  Anti-infectives: Anti-infectives    Start     Dose/Rate Route Frequency Ordered Stop   01/19/16 0900  cefTRIAXone (ROCEPHIN) 1 g in dextrose 5 % 50 mL IVPB     1 g 100 mL/hr over 30 Minutes Intravenous  Once 01/18/16 1435 01/19/16 0957   01/18/16 1030  cefTRIAXone (ROCEPHIN) 1 g in dextrose 5 % 50 mL IVPB  Status:  Discontinued     1 g 100 mL/hr over 30 Minutes Intravenous To Surgery 01/18/16 1023 01/18/16 1431     Assessment/Plan s/p Procedure(s): OPEN PARTIAL COLECTOMY WITH END COLOSTOMY (N/A) - Advance diet to regular - Re-check potassium (2.8 yesterday) - d/c LLQ drain - Mobilize  From a surgical standpoint, the patient is stable for discharge if she tolerates a regular diet at lunch. She will follow-up in the [facility] office for staple removal and a post-operative visit with Dr. Kieth Brightly. She will need Home Health for ostomy care but will not require any wound care at home.   LOS: 9 days    Jill Alexanders , [facility] 01/25/2016, 10:02 AM Pager: [phone] Consults: [phone] Mon-Fri 7:00 am-4:30 pm Sat-Sun 7:00 am-11:30 am

## 2016-01-25 NOTE — Progress Notes (Signed)
Taylor Whitney to be D/C'd  per MD order. Discussed with the patient and all questions fully answered.  VSS, Skin clean, dry and intact without evidence of skin break down, no evidence of skin tears noted.  IV catheter discontinued intact. Site without signs and symptoms of complications. Dressing and pressure applied.  An After Visit Summary was printed and given to the patient. Patient received prescription.  D/c education completed with patient/family including follow up instructions, medication list, d/c activities limitations if indicated, with other d/c instructions as indicated by MD - patient able to verbalize understanding, all questions fully answered.   Patient instructed to return to ED, call 911, or call MD for any changes in condition.   Patient to be escorted via [place], and D/C home via private auto.

## 2016-01-25 NOTE — Discharge Instructions (Signed)
Colostomy Surgery, Adult °A colostomy surgery is a procedure that redirects a section of the large intestine (colon) to an opening in the abdomen. This opening is called a stoma or ostomy. A bag is attached to the stoma on the outside of the body. This bag collects waste, since the waste can no longer travel through the rest of the colon. Where the stoma is located and what it looks like depends on the type of colostomy performed. A colostomy may be temporary or permanent. The hospital stay after this procedure is typically 3-7 days. °LET YOUR HEALTH CARE PROVIDER KNOW ABOUT: °· Allergies to food or medicine. °· Medicines taken, including vitamins, health supplements, herbs, eye drops, over-the-counter medicines, and creams. °· Use of steroids (by mouth or creams). °· Previous problems with anesthetics or numbing medicines. °· History of bleeding problems or blood clots. °· Previous surgery. °· Other health problems, including diabetes and kidney problems. °· Possibility of pregnancy, if this applies. °RISKS AND COMPLICATIONS °General surgical complications may include: °· Reaction to anesthetics. °· Damage to surrounding nerves, tissues, or structures. °· Infection. °· Blood clot. °· Bleeding. °· Scarring. °· Pain that lasts longer than 3 months. °Specific risks for colostomy, while rare, may include: °· Intestinal blockage. °· Skin irritation. °· Wound opening. °· Narrowing or collapsing of the stoma. °· Hernia. °BEFORE THE PROCEDURE °It is important to follow your health care provider's instructions prior to your procedure to avoid complications. Steps before your procedure may include: °· A physical exam, blood and urine tests, stool test, X-rays, and other procedures. °· Chemotherapy or radiation therapy. °· A review of the procedure, the anesthetic being used, and what to expect after the procedure. You may meet with an ostomy advisor. °You may be asked to: °· Stop taking certain medicines for several days  prior to your procedure such as blood thinners (including aspirin). °· Take certain medicines, such as antibiotics or stool softeners. °· Follow a special diet for several days prior to the procedure and to avoid eating and drinking after midnight the night before the procedure. This will help you to avoid complications from the anesthetic. °· Take an antibacterial shower the night before, or the morning of, the procedure. °· Quit smoking. Smoking increases the chances of an infection or a healing problem after your procedure. °Arrange for someone to drive you home after surgery. You should also arrange to have someone help you with activities while you recover. °PROCEDURE °There are several types of colostomy procedures. The 2 main procedure types are loop colostomy or end colostomy. During a loop colostomy, the surgeon pulls 2 ends of the intestine out toward the abdomen, using 2 openings. During an end colostomy, the surgeon pulls 1 end of the intestine out toward the abdomen, through 1 opening. You will be given medicine that makes you sleep (general anesthetic). The procedure may be done as open surgery, with a large cut (incision), or as laparoscopic surgery, with several small incisions. °AFTER THE PROCEDURE °· You will be given pain medicine. °· You may be able to suck on ice. You may begin drinking clear fluids the next day and begin a normal diet after 2 days, or as directed by your health care provider. °· Your stoma will be covered with bandages or a pouch. °· Initial drainage from the stoma will be liquid. °· The stoma may be dark-colored, swollen, and bruised until it has more time to heal. °  °This information is not intended to replace advice given   to you by your health care provider. Make sure you discuss any questions you have with your health care provider. °  °Document Released: 10/05/2010 Document Revised: 09/29/2014 Document Reviewed: 10/05/2010 °Elsevier Interactive Patient Education ©2016  [employer] ° °

## 2016-01-25 NOTE — Telephone Encounter (Signed)
Call from Dr. Broadus John (inpatient): patient being discharged today and very anxious to get an appointment before leaving hospital today. Informed MD to have her see Dr. Burr Medico on 02/01/16 at 11:00 and arrive at 10:30 am. She will be called tomorrow by navigator. Notified HIM to enter appointment into EPIC.

## 2016-01-26 DIAGNOSIS — Z433 Encounter for attention to colostomy: Secondary | ICD-10-CM | POA: Diagnosis not present

## 2016-01-26 DIAGNOSIS — E119 Type 2 diabetes mellitus without complications: Secondary | ICD-10-CM | POA: Diagnosis not present

## 2016-01-26 DIAGNOSIS — Z7984 Long term (current) use of oral hypoglycemic drugs: Secondary | ICD-10-CM | POA: Diagnosis not present

## 2016-01-26 DIAGNOSIS — Z9049 Acquired absence of other specified parts of digestive tract: Secondary | ICD-10-CM | POA: Diagnosis not present

## 2016-01-26 DIAGNOSIS — Z48815 Encounter for surgical aftercare following surgery on the digestive system: Secondary | ICD-10-CM | POA: Diagnosis not present

## 2016-01-26 DIAGNOSIS — C8590 Non-Hodgkin lymphoma, unspecified, unspecified site: Secondary | ICD-10-CM | POA: Diagnosis not present

## 2016-01-27 ENCOUNTER — Encounter (HOSPITAL_COMMUNITY): Payer: Self-pay

## 2016-01-28 DIAGNOSIS — E119 Type 2 diabetes mellitus without complications: Secondary | ICD-10-CM | POA: Diagnosis not present

## 2016-01-28 DIAGNOSIS — Z433 Encounter for attention to colostomy: Secondary | ICD-10-CM | POA: Diagnosis not present

## 2016-01-28 DIAGNOSIS — C8590 Non-Hodgkin lymphoma, unspecified, unspecified site: Secondary | ICD-10-CM | POA: Diagnosis not present

## 2016-01-28 DIAGNOSIS — Z9049 Acquired absence of other specified parts of digestive tract: Secondary | ICD-10-CM | POA: Diagnosis not present

## 2016-01-28 DIAGNOSIS — Z48815 Encounter for surgical aftercare following surgery on the digestive system: Secondary | ICD-10-CM | POA: Diagnosis not present

## 2016-01-28 DIAGNOSIS — Z7984 Long term (current) use of oral hypoglycemic drugs: Secondary | ICD-10-CM | POA: Diagnosis not present

## 2016-02-01 ENCOUNTER — Encounter: Payer: Self-pay | Admitting: *Deleted

## 2016-02-01 ENCOUNTER — Ambulatory Visit (HOSPITAL_BASED_OUTPATIENT_CLINIC_OR_DEPARTMENT_OTHER): Payer: BLUE CROSS/BLUE SHIELD

## 2016-02-01 ENCOUNTER — Ambulatory Visit (HOSPITAL_BASED_OUTPATIENT_CLINIC_OR_DEPARTMENT_OTHER): Payer: BLUE CROSS/BLUE SHIELD | Admitting: Hematology

## 2016-02-01 ENCOUNTER — Encounter: Payer: Self-pay | Admitting: Hematology

## 2016-02-01 ENCOUNTER — Telehealth: Payer: Self-pay | Admitting: Hematology

## 2016-02-01 VITALS — BP 144/85 | HR 99 | Temp 97.9°F | Resp 18 | Ht 63.0 in | Wt 149.1 lb

## 2016-02-01 DIAGNOSIS — E119 Type 2 diabetes mellitus without complications: Secondary | ICD-10-CM | POA: Diagnosis not present

## 2016-02-01 DIAGNOSIS — C186 Malignant neoplasm of descending colon: Secondary | ICD-10-CM | POA: Diagnosis not present

## 2016-02-01 LAB — COMPREHENSIVE METABOLIC PANEL
ALK PHOS: 83 U/L (ref 40–150)
ALT: 12 U/L (ref 0–55)
AST: 13 U/L (ref 5–34)
Albumin: 3.3 g/dL — ABNORMAL LOW (ref 3.5–5.0)
Anion Gap: 14 mEq/L — ABNORMAL HIGH (ref 3–11)
BUN: 11.4 mg/dL (ref 7.0–26.0)
CALCIUM: 9.6 mg/dL (ref 8.4–10.4)
CHLORIDE: 103 meq/L (ref 98–109)
CO2: 25 mEq/L (ref 22–29)
Creatinine: 0.7 mg/dL (ref 0.6–1.1)
Glucose: 94 mg/dl (ref 70–140)
POTASSIUM: 3.3 meq/L — AB (ref 3.5–5.1)
Sodium: 142 mEq/L (ref 136–145)
Total Bilirubin: 0.53 mg/dL (ref 0.20–1.20)
Total Protein: 7.7 g/dL (ref 6.4–8.3)

## 2016-02-01 LAB — CBC WITH DIFFERENTIAL/PLATELET
BASO%: 0.2 % (ref 0.0–2.0)
BASOS ABS: 0 10*3/uL (ref 0.0–0.1)
EOS ABS: 0.1 10*3/uL (ref 0.0–0.5)
EOS%: 1 % (ref 0.0–7.0)
HEMATOCRIT: 33.4 % — AB (ref 34.8–46.6)
HEMOGLOBIN: 10.7 g/dL — AB (ref 11.6–15.9)
LYMPH#: 2.9 10*3/uL (ref 0.9–3.3)
LYMPH%: 27.5 % (ref 14.0–49.7)
MCH: 24.2 pg — AB (ref 25.1–34.0)
MCHC: 32 g/dL (ref 31.5–36.0)
MCV: 75.6 fL — AB (ref 79.5–101.0)
MONO#: 0.8 10*3/uL (ref 0.1–0.9)
MONO%: 7.7 % (ref 0.0–14.0)
NEUT#: 6.8 10*3/uL — ABNORMAL HIGH (ref 1.5–6.5)
NEUT%: 63.6 % (ref 38.4–76.8)
PLATELETS: 436 10*3/uL — AB (ref 145–400)
RBC: 4.42 10*6/uL (ref 3.70–5.45)
RDW: 15 % — AB (ref 11.2–14.5)
WBC: 10.7 10*3/uL — ABNORMAL HIGH (ref 3.9–10.3)

## 2016-02-01 LAB — IRON AND TIBC
%SAT: 5 % — AB (ref 21–57)
IRON: 15 ug/dL — AB (ref 41–142)
TIBC: 328 ug/dL (ref 236–444)
UIBC: 312 ug/dL (ref 120–384)

## 2016-02-01 LAB — FERRITIN: FERRITIN: 22 ng/mL (ref 9–269)

## 2016-02-01 LAB — CEA (IN HOUSE-CHCC): CEA (CHCC-In House): <1 ng/mL (ref 0.00–5.00)

## 2016-02-01 MED ORDER — ONDANSETRON HCL 8 MG PO TABS: 8.0000 mg | ORAL_TABLET | Freq: Two times a day (BID) | ORAL | 1 refills | Status: DC | PRN

## 2016-02-01 MED ORDER — PROCHLORPERAZINE MALEATE 10 MG PO TABS: 10.0000 mg | ORAL_TABLET | Freq: Four times a day (QID) | ORAL | 1 refills | Status: DC | PRN

## 2016-02-01 NOTE — Telephone Encounter (Signed)
AVS REPORT AND SCHD GIVEN PER 02/01/16 LOS. °

## 2016-02-01 NOTE — Patient Instructions (Signed)
Care Plan Summary- 02/01/2016 Name:  Taylor Whitney      DOB: 05/02/1971 Your Medical Team:  Medical Oncologist:  Dr. Truitt Merle Radiation Oncologist:   Surgeon:   Dr. Gurney Maxin Type of Cancer: Adenocarcinoma of Colon  Stage/Grade: Stage IIIB  *Exact staging of your cancer is based on size of the tumor, depth of invasion, involvement of lymph nodes or not, and whether or not the cancer has spread beyond the primary site.   Recommendations: Based on information available as of today's consult. Recommendations may change depending on the results of further tests or exams. 1) Chemotherapy with IV Oxaliplatin every 3 weeks and oral Xeloda twice daily X 2 weeks for 4 cycles (3 months)-plan to start last week of September 2) Genetics counselor appointment 3) Full colonoscopy in 6 months _______________________________________________________________________ Next Steps: 1) Will check your labs (iron studies) today 2) Chemotherapy teaching class in 2 weeks 3) Return to Dr. Burr Medico in 3 weeks with beginning of chemotherapy 4) Pharmacy will call when your Xeloda script is ready and with copay information Questions? Merceda Elks, RN, BSN at [phone]. Manuela Schwartz is your Oncology Nurse Navigator and is available to assist you while you're receiving your medical care at [facility]

## 2016-02-01 NOTE — Progress Notes (Signed)
[place]  [phone] Fax:([phone]) [phone]  Clinic New Consult Note   Patient Care Team: Donald Prose, MD as PCP - General (Family Medicine) 02/01/2016  REFERRAL PHYSICIAN: Dr. Kieth Brightly   CHIEF COMPLAINTS/PURPOSE OF CONSULTATION:  Stage IIIB colon cancer   Oncology History   Cancer of left colon [facility])   Staging form: Colon and Rectum, AJCC 7th Edition   - Pathologic stage from 01/18/2016: Stage IIIB (T3, N1a, cM0) - Signed by Truitt Merle, MD on 02/01/2016      Cancer of left colon ([place])   01/17/2016 Initial Diagnosis    Cancer of left colon ([place])      01/17/2016 Procedure    Flexible sigmoidoscopy showed malignant-appearing obstructing tumor in the descending colon, biopsied.      01/18/2016 Surgery    Patient underwent left hemicolectomy       01/18/2016 Pathology Results    Left hemicolectomy showed invasive adenocarcinoma, well differentiated, 4.1 cm, tumor extends into pericolonic soft tissue, lymphovascular invasion is identified, margins were negative, 1 out of 27 lymph nodes were positive.       HISTORY OF PRESENTING ILLNESS:  Taylor Whitney 45 y.o. female is here because of her recently diagnosed stage IIIB colon cancer, she presents to my clinic by herself, and her two sisters were on the phone during her visit.   She presents to [facility] hospital on 01/16/2016 with nausea and vomiting for one day. She had a CT scan and flexible sigmoidoscopy which showed a near obstructing mass in the descending colon. She was seen by surgeon Dr. Kieth Brightly and underwent left colectomy and end colostomy. There were minor injury to the duodenum during her surgery, postop study showed no leakage from duodenum. She tolerated surgery and recovered well, was discharged home after 9 days of hospital stay.  She has been recovering well from surgery, has mimimum pain at incisions, her appetite is still relatively low, her energy level has improved, she table to function  and tolerate daily activities without much difficulty. She lost about 20lbs in the past few months.  She was diagnosed with DM 6 months ago, on metformin, does not check her blood glucose at home.  She was diagnosed with HL stage III in 1996, she was diagnosed and treated in [place], s/p chemo ABVD 9 months (bleomycin was held earlier due to pulmonary toxicities), no radiation. She followed with her oncologist for 5 years after treatment.    MEDICAL HISTORY:  Past Medical History:  Diagnosis Date  . Adenocarcinoma of descending colon ([place])    Flex sigmoidoscopy biopsy of descending colon mass is adenocarcinoma/notes 01/18/2016  . Depression   . Hodgkin lymphoma ([place]) dx'd 01/1995  . Migraine    "maybe once/month" (01/18/2016)  . Type II diabetes mellitus ([place])     SURGICAL HISTORY: Past Surgical History:  Procedure Laterality Date  . COLON RESECTION N/A 01/18/2016   Procedure: OPEN PARTIAL COLECTOMY WITH END COLOSTOMY;  Surgeon: Mickeal Skinner, MD;  Location: [place];  Service: General;  Laterality: N/A;  . COLOSTOMY  01/18/2016  . DILATION AND CURETTAGE OF UTERUS    . FLEXIBLE SIGMOIDOSCOPY N/A 01/17/2016   Procedure: FLEXIBLE SIGMOIDOSCOPY;  Surgeon: Ladene Artist, MD;  Location: [facility] ENDOSCOPY;  Service: Endoscopy;  Laterality: N/A;  . PARTIAL COLECTOMY  01/18/2016    SOCIAL HISTORY: Social History   Social History  . Marital status: Single    Spouse name: N/A  . Number of children: N/A  . Years of  education: N/A   Occupational History  . Not on file.   Social History Main Topics  . Smoking status: Never Smoker  . Smokeless tobacco: Never Used  . Alcohol use Yes     Comment: social drinker  . Drug use: No  . Sexual activity: Not Currently   Other Topics Concern  . Not on file   Social History Narrative  . 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  Diabetes Mother   . Cancer Father     prostate cancer  . Cancer Paternal Uncle     renal cancer     ALLERGIES:  is allergic to sulfa antibiotics.  MEDICATIONS:  Current Outpatient Prescriptions  Medication Sig Dispense Refill  . acetaminophen (TYLENOL) 500 MG tablet Take 1 tablet (500 mg total) by mouth every 6 (six) hours as needed. 30 tablet 0  . metFORMIN (GLUCOPHAGE-XR) 500 MG 24 hr tablet Take 500 mg by mouth 2 (two) times daily.  0  . UNABLE TO FIND This note is to excuse Ms.Kharis Kristiansen from work from 8/20 through 8/29 due to medical illness requiring hospitalization, ok to return to work by September 8 1 each 0  . venlafaxine XR (EFFEXOR-XR) 75 MG 24 hr capsule Take 225 mg by mouth at bedtime.  5  . ondansetron (ZOFRAN) 8 MG tablet Take 1 tablet (8 mg total) by mouth 2 (two) times daily as needed for refractory nausea / vomiting. Start on day 3 after chemotherapy. 30 tablet 1  . prochlorperazine (COMPAZINE) 10 MG tablet Take 1 tablet (10 mg total) by mouth every 6 (six) hours as needed (Nausea or vomiting). 30 tablet 1   No current facility-administered medications for this visit.     REVIEW OF SYSTEMS:   Constitutional: Denies fevers, chills or abnormal night sweats Eyes: Denies blurriness of vision, double vision or watery eyes Ears, nose, mouth, throat, and face: Denies mucositis or sore throat Respiratory: Denies cough, dyspnea or wheezes Cardiovascular: Denies palpitation, chest discomfort or lower extremity swelling Gastrointestinal:  Denies nausea, heartburn or change in bowel habits Skin: Denies abnormal skin rashes Lymphatics: Denies new lymphadenopathy or easy bruising Neurological:Denies numbness, tingling or new weaknesses Behavioral/Psych: Mood is stable, no new changes  All other systems were reviewed with the patient and are negative.  PHYSICAL EXAMINATION: ECOG PERFORMANCE STATUS: 1 - Symptomatic but completely ambulatory  Vitals:   02/01/16 1115    BP: (!) 144/85  Pulse: 99  Resp: 18  Temp: 97.9 F (36.6 C)   Filed Weights   02/01/16 1115  Weight: 149 lb 1.6 oz (67.6 kg)    GENERAL:alert, no distress and comfortable SKIN: skin color, texture, turgor are normal, no rashes or significant lesions EYES: normal, conjunctiva are pink and non-injected, sclera clear OROPHARYNX:no exudate, no erythema and lips, buccal mucosa, and tongue normal  NECK: supple, thyroid normal size, non-tender, without nodularity LYMPH:  no palpable lymphadenopathy in the cervical, axillary or inguinal LUNGS: clear to auscultation and percussion with normal breathing effort HEART: regular rate & rhythm and no murmurs and no lower extremity edema ABDOMEN:abdomen soft, non-tender and normal bowel sounds, (+) crusting about in the right side abdomen, midline surgical incision appears clean, without discharge or skin erythema. Musculoskeletal:no cyanosis of digits and no clubbing  PSYCH: alert & oriented x 3 with fluent speech NEURO: no focal motor/sensory deficits  LABORATORY DATA:  I have reviewed the data as listed CBC Latest Ref Rng & Units 01/24/2016 01/23/2016 01/20/2016  WBC 4.0 - 10.5 K/uL 10.3 14.8(H) 13.0(H)  Hemoglobin 12.0 - 15.0 g/dL 9.9(L) 11.6(L) 8.8(L)  Hematocrit 36.0 - 46.0 % 31.7(L) 36.9 29.2(L)  Platelets 150 - 400 K/uL 416(H) 531(H) 326   CMP Latest Ref Rng & Units 01/25/2016 01/24/2016 01/23/2016  Glucose 65 - 99 mg/dL 118(H) 128(H) 118(H)  BUN 6 - 20 mg/dL _0 Creatinine 0.44 - 1.00 mg/dL 0.66 0.59 0.69  Sodium 135 - 145 mmol/L 139 138 137  Potassium 3.5 - 5.1 mmol/L 3.1(L) 2.8(L) 3.6  Chloride 101 - 111 mmol/L 101 104 104  CO2 22 - 32 mmol/L _1 Calcium 8.9 - 10.3 mg/dL 9.2 8.2(L) 8.5(L)  Total Protein 6.5 - 8.1 g/dL - - -  Total Bilirubin 0.3 - 1.2 mg/dL - - -  Alkaline Phos 38 - 126 U/L - - -  AST 15 - 41 U/L - - -  ALT 14 - 54 U/L - - -   Results for DEVANEE, POMPLUN (MRN [id number]) as of 02/01/2016 07:28  Ref.  Range 01/16/2016 14:50  CEA Latest Ref Range: 0.0 - 4.7 ng/mL 1.7    PATHOLOGY REPORT  Diagnosis 01/18/2016 Colon, segmental resection for tumor, Left - INVASIVE ADENOCARCINOMA, WELL DIFFERENTIATED, SPANNING 4.1 CM. - ADENOCARCINOMA EXTENDS INTO PERICOLONIC SOFT TISSUE. - LYMPHOVASCULAR INVASION IS IDENTIFIED. - THE SURGICAL RESECTION MARGINS ARE NEGATIVE FOR CARCINOMA. - METASTATIC CARCINOMA IN 1 OF 27 LYMPH NODES (1/27). - DIVERTICULAR DISEASE (GROSS DIAGNOSIS). - SEE ONCOLOGY TABLE BELOW. Microscopic Comment COLON AND RECTUM (INCLUDING TRANS-ANAL RESECTION): Specimen: Left colon. Procedure: Resection. Tumor site: Central portion of specimen. Specimen integrity: Intact. Macroscopic intactness of mesorectum: N/A Macroscopic tumor perforation: Not identified. Invasive tumor: Maximum size: 4.1 cm Histologic type(s): Adenocarcinoma Histologic grade and differentiation: G1: well differentiated/low grade. Type of polyp in which invasive carcinoma arose: Likely tubular adenoma. Microscopic extension of invasive tumor: Extends into the pericolonic soft tissue. Lymph-Vascular invasion: Present, focal Peri-neural invasion: Not identified. Tumor deposit(s) (discontinuous extramural extension): Not identified. Resection margins: Proximal margin: 14.1 cm Distal margin: 15.8 cm Circumferential (radial) (posterior ascending, posterior descending; lateral and posterior mid-rectum; and entire lower 1/3 rectum): 3.0 cm 1 of 3 FINAL for NAELLE, DIEGEL ([id number]) Microscopic Comment(continued) Treatment effect (neo-adjuvant therapy): N/A Additional polyp(s): Not identified. Non-neoplastic findings: Diverticular disease and splenule. Lymph nodes: number examined - 27; number positive: 1 Pathologic Staging: pT3, pN1a Ancillary studies: A block will be sent for MMR testing by IHC and a separate block will be sent for MSI testing by PCR. Additional studies can be performed upon  clinician request. (JBK:gt, 01/19/16) Enid Cutter MD Pathologist, Electronic Signature (Case signed 01/19/2016)     Diagnosis 01/17/2016 Colon, biopsy, descending - POSITIVE FOR ADENOCARCINOMA. Microscopic Comment The findings are called to Dr. Fuller Plan on 01/18/16. Dr. Orene Desanctis has seen this case in consultation with agreement. (RAH;gt, 01/18/16)   RADIOGRAPHIC STUDIES: I have personally reviewed the radiological images as listed and agreed with the findings in the report. Dg Abd 1 View  Result Date: 01/22/2016 CLINICAL DATA:  Evaluate ileus EXAM: ABDOMEN - 1 VIEW COMPARISON:  01/21/2016 FINDINGS: There is a surgical drain overlying the left hemi abdomen. The nasogastric tube is been removed. No dilated loops of small or large bowel identified. Enteric contrast material is identified within right lower quadrant bowel loops. IMPRESSION: 1. No abnormal bowel dilatation identified. Electronically Signed  By: Signa Kell M.D.   On: 01/22/2016 19:24   Dg Abd 1 View  Result Date: 01/17/2016 CLINICAL DATA:  Large bowel obstruction. EXAM: ABDOMEN - 1 VIEW COMPARISON:  CT 01/16/2016.  Abdominal series 820 2017. FINDINGS: Persistent right and transverse colon mild distention. No small bowel distention. No free air. No acute bony abnormality. IMPRESSION: Persistent right and transverse colon mild distention. No small bowel distention. The Electronically Signed   By: Maisie Fus  Register   On: 01/17/2016 07:05   Ct Abdomen Pelvis W Contrast  Result Date: 01/16/2016 CLINICAL DATA:  Constipation. Vomiting and abdominal pain, 2 days duration. EXAM: CT ABDOMEN AND PELVIS WITH CONTRAST TECHNIQUE: Multidetector CT imaging of the abdomen and pelvis was performed using the standard protocol following bolus administration of intravenous contrast. CONTRAST:  ISOVUE-300 IOPAMIDOL (ISOVUE-300) INJECTION 61% COMPARISON:  07/27/2006. FINDINGS: Lower chest: Lungs are clear. There is focal thickening of or along the  left diaphragm posterior medially. This was present in 2008 and has not changed. It could represent a benign mesenchymal mass. It probably does not require further follow-up. Hepatobiliary: Mild fatty change of the liver. No focal lesion. No calcified gallstones. Pancreas: Normal Spleen: Normal Adrenals/Urinary Tract: Adrenal glands are normal. Kidneys are normal. No cyst, mass, stone or hydronephrosis. Stomach/Bowel: The right colon, transverse colon and proximal descending colon are dilated and full of fluid and stool. There is a distinct caliber change at the mid descending colon. This could be due to a mass or a stricture. There is mild adjacent fat edema. Early perforation not excluded. No evidence of free air. The dilated portion of the colon shows wall thickening and mild stranding in the surrounding fat. This could be inflammation due to the obstruction. Vascular/Lymphatic: Aorta and IVC are normal. No retroperitoneal mass or lymphadenopathy. Reproductive: 2.6 cm leiomyoma projecting from the right fundus of the uterus. Both ovaries and adnexal regions appear normal. Other: None. Musculoskeletal:  Chronic disc herniations at L2-3, L3-4 and L4-5. IMPRESSION: Pattern of colon obstruction at the mid descending colon. Proximal to that, the colon is markedly dilated with fluid, air and fecal matter. This could be due to a stricture or mass. In the obstructive portion, there is wall thickening and mild edema of the surrounding fat that could be secondary to the obstruction. Small amount of edema in the fat at the site of obstruction is also noted. No definite perforation or free air however. No evidence of adenopathy. Electronically Signed   By: Paulina Fusi M.D.   On: 01/16/2016 13:03   Dg Abd Acute W/chest  Result Date: 01/16/2016 CLINICAL DATA:  Constipation starting Friday, vomiting, nausea EXAM: DG ABDOMEN ACUTE W/ 1V CHEST COMPARISON:  07/27/2006 FINDINGS: Cardiomediastinal silhouette is unremarkable. No  acute infiltrate or pulmonary edema. There is distension of the right colon and transverse colon with gas and fluid. Paucity of bowel gas in distal colon. Colonic ileus or colonic obstruction cannot be excluded. No evidence of free abdominal air. Clinical correlation is necessary. IMPRESSION: No acute disease within chest.There is distension of the right colon and transverse colon with gas and fluid. Paucity of bowel gas in distal colon. Colonic ileus or colonic obstruction cannot be excluded. No evidence of free abdominal air. Clinical correlation is necessary. Electronically Signed   By: Natasha Mead M.D.   On: 01/16/2016 10:19   Dg Kayleen Memos W/water Sol Cm  Result Date: 01/21/2016 CLINICAL DATA:  Assess for leak in the fourth portion of the duodenum. Duodenotomy during tumor resection,  on 01/18/2016, repaired. EXAM: WATER SOLUBLE UPPER GI SERIES TECHNIQUE: Single-column upper GI series was performed using water soluble contrast. CONTRAST:  150 cc Omnipaque 300 COMPARISON:  None. FLUOROSCOPY TIME:  Radiation Exposure Index (as provided by the fluoroscopic device): If the device does not provide the exposure index: Fluoroscopy Time (in minutes and seconds):  2 minutes come 0 seconds Number of Acquired Images:  2 FINDINGS: 150 cc of water-soluble contrast was instilled via the nasogastric tube. On the KUB, a left flank drain is also in place and skin clips from laparotomy are noted. I was able to the turned the patient up on her right side in order to facilitate dumping of contrast medium into the duodenum. I would in turn the patient up on the left side in order to facilitate transfer of the duodenal contrast into the distal duodenum. I obtain fluoroscopic spot images at numerous degrees of rotation as I turned the patient back towards her back. There does appear to be some slight narrowing of the fourth portion of the duodenum for example on series 24, but I perceive no leak from the duodenum. Contrast medium extends  into the jejunum. I obtained a follow up KUB after the fluoroscopic portion of the exam and it demonstrates no funneling of contrast outside of bowel in the vicinity of the prior duodenotomy. IMPRESSION: 1. No leak identified. There is some slight narrowing of the junction of the duodenum and jejunum in the vicinity of the prior duodenotomy repair. 2. Today' s exam was performed with water-soluble contrast, and is optimized specifically to assess the distal duodenum. Electronically Signed   By: Van Clines M.D.   On: 01/21/2016 11:18   Sigmoidoscopy 01/17/2016 - Malignant appearing, obstructing tumor in the descending colon. Biopsied. - Otherwise normal flex sig however limited by poor prep.  ASSESSMENT & PLAN:  45 year old with past medical history of diabetes, remote history of Hodgkin lymphoma, presented with bowel obstruction.  1. Cancer of left colon, invasive adenocarcinoma, well-differentiated, pT3N1aM0, stage IIIB, MSI-stable  -I reviewed her surgical pathology findings with patient and her sisters in great details -Her CT of the abdomen was negative for distant metastasis, I'll obtain a CT chest without contrast in the next few weeks to complete staging -We discussed risk of cancer recurrence after complete surgical resection. Given her positive lymph nodes, stage IIIB disease, LVI (+), she is at high risk for recurrence.  -We discussed the standard care for stage III colon cancer is adjuvant chemotherapy. Given his young age, I recommend combined chemotherapy, FOLFOX or CAPEOX. Due to his work schedule, she opted CAPEOX. -We discussed the new data from pooled 6 phase 3 trial investigating duration of adjuvant oxaliplatin-based therapy (3 versus 6 months) for stage III colon cancer which was reported in ASCO 2017 recently, showed similar three-year disease-free survival of 3 months versus 6 months in patients with T1-3N1 disease. Based on the new data, I recommend her to have 3 months  (instead of 6 months) CAPEOX -Chemotherapy consent: Side effects including but does not not limited to, fatigue, nausea, vomiting, diarrhea, hair loss, neuropathy, fluid retention, renal and kidney dysfunction, neutropenic fever, needed for blood transfusion, bleeding, coronary artery spasm and heart attack, were discussed with patient in great detail. She agrees to proceed. -She had prolonged chemotherapy ABVD for her Hodgkin lymphoma 20 years ago, her marrow reservation may be low, we will watch her blood counts carefully. --I discussed the risk of cancer recurrence in the future. I discussed the surveillance  plan, which is a physical exam and lab test (including CBC, CMP and CEA) every 3 months for the first 2 years, then every 6-12 months, colonoscopy in one year, and surveilliance CT scan every 6-12 month for up to 5 year.   2. Genetics -Her colon cancer is MSI-stable, she has no family history of colon cancer, this is unlikely Lynch syndrome -Given her young age, I'll refer her to see genetic counselor in our cancer center to ruled out other inheritable cancer syndromes.  3. DM -She was diagnosed with diabetes in earlier 2017. She is on metformin -I strongly encouraged her to monitor her blood sugar at home -We discussed chemotherapy may impact her blood sugar level, especially premedication dexamethasone.    PLAN -CT chest without contrast in the next 1-2 weeks -Chemotherapy class and see me back in 2 weeks -We'll tentatively start her on chemotherapy CAPOX on September 28 -I'll send a prescription of Xeloda to [facility] today   Orders Placed This Encounter  Procedures  . CT Chest Wo Contrast    Standing Status:   Future    Standing Expiration Date:   01/31/2017    Order Specific Question:   Reason for Exam (SYMPTOM  OR DIAGNOSIS REQUIRED)    Answer:   staging    Order Specific Question:   Is patient pregnant?    Answer:   No    Order Specific Question:   Preferred  imaging location?    Answer:   [facility]  . CBC with Differential    Standing Status:   Standing    Number of Occurrences:   30    Standing Expiration Date:   01/31/2021  . Comprehensive metabolic panel    Standing Status:   Standing    Number of Occurrences:   30    Standing Expiration Date:   01/31/2021  . Ferritin    Standing Status:   Future    Standing Expiration Date:   01/31/2017  . Iron and TIBC    Standing Status:   Future    Standing Expiration Date:   01/31/2017  . CEA    Standing Status:   Standing    Number of Occurrences:   15    Standing Expiration Date:   01/31/2021    All questions were answered. The patient knows to call the clinic with any problems, questions or concerns. I spent 55 minutes counseling the patient face to face. The total time spent in the appointment was 60 minutes and more than 50% was on counseling.     Truitt Merle, MD 02/01/2016 12:40 PM

## 2016-02-01 NOTE — Progress Notes (Signed)
Oncology Nurse Navigator Documentation  Oncology Nurse Navigator Flowsheets 02/01/2016  Navigator Location [facility]  Navigator Encounter Type Initial MedOnc  Telephone -  Abnormal Finding Date -  Confirmed Diagnosis Date -  Surgery Date -  Patient Visit Type MedOnc;Initial  Treatment Phase Pre-Tx/Tx Discussion  Barriers/Navigation Needs Education  Education Understanding Cancer/ Treatment Options;Coping with Diagnosis/ Prognosis;Newly Diagnosed Cancer Education;Preparing for Upcoming  Treatment  Interventions Education Method  Education Method Verbal;Written;Teach-back  Support Groups/Services GI Support Group;[place];Other--[facility] support center  Specialty Items/DME [place] supplies--confirmed she has HHN coming weekly and is enrolled in [employer]. Has already ordered supplies via [employer]  Acuity Level 2  Time Spent with Patient 60  Met with patient and had her sisters, Taylor Whitney & Taylor Whitney on conference call during new patient visit. Explained the role of the GI Nurse Navigator and provided New Patient Packet with information on: 1. Colon cancer--drug sheets on Oxaliplatin and Xeloda; CEA test; anatomy of colon 2. Support groups 3. Advanced Directives 4. Fall Safety Plan Answered questions, reviewed current treatment plan using TEACH back and provided emotional support. Provided copy of current treatment plan. Taylor Whitney is single and lives alone with her pet dog. 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 She has friends, but no family locally-she is originally from [place]. She is independent in all ADLS. Has a home health nurse weekly, but has been independent in her ostomy care. She has had Hodgkins Lymphoma and had ABVD treatment 20 years ago in [place]. She is new diabetic and on metformin. She has a meter and is not checking her blood sugars at this time-strongly encourage her to check blood sugar at least daily.  Navigator will make referral to dietician when her chemo class appointment is scheduled. Will also make CSW aware of request to contact her due to limited family support and has diagnosis of depression currently controlled with medication.  Merceda Elks, RN, BSN GI Oncology [place]

## 2016-02-03 ENCOUNTER — Telehealth: Payer: Self-pay | Admitting: Hematology

## 2016-02-03 NOTE — Telephone Encounter (Signed)
Faxed ROI form to [facility] in [place]

## 2016-02-07 ENCOUNTER — Telehealth: Payer: Self-pay | Admitting: *Deleted

## 2016-02-07 DIAGNOSIS — Z933 Colostomy status: Secondary | ICD-10-CM | POA: Diagnosis not present

## 2016-02-07 DIAGNOSIS — Z85038 Personal history of other malignant neoplasm of large intestine: Secondary | ICD-10-CM | POA: Diagnosis not present

## 2016-02-07 NOTE — Telephone Encounter (Signed)
Oncology Nurse Navigator Documentation  Oncology Nurse Navigator Flowsheets 02/07/2016  Navigator Location [facility]  Navigator Encounter Type Telephone  Telephone Outgoing Call  Abnormal Finding Date -  Confirmed Diagnosis Date -  Surgery Date -  Patient Visit Type -  Treatment Phase -  Barriers/Navigation Needs Coordination of Care--chemo class  Education -  Interventions Coordination of Care--called to offer chemo class same day as her CT scan  Coordination of Care Chemo class 02/10/16 at 10:00  Education Method -  Support Groups/Services -  Specialty Items/DME -  Acuity -  Time Spent with Patient 15

## 2016-02-08 ENCOUNTER — Other Ambulatory Visit: Payer: Self-pay | Admitting: Hematology

## 2016-02-08 DIAGNOSIS — C186 Malignant neoplasm of descending colon: Secondary | ICD-10-CM

## 2016-02-08 MED ORDER — CAPECITABINE 500 MG PO TABS: ORAL_TABLET | ORAL | 3 refills | Status: DC

## 2016-02-09 ENCOUNTER — Encounter: Payer: Self-pay | Admitting: Pharmacist

## 2016-02-09 NOTE — Progress Notes (Signed)
Oral Chemotherapy Pharmacist Encounter   Received prescription for Xeloda 2000mg  in the am and 1500mg  in the pm (1000 mg/m2 BID), days 1-14 on a 21 day cycle to be given along with oxaliplatin. Labs from 9/5 reviewed, ok for treatment. No DDI's with current medication list and Xeloda identified.  Prescription will be sent to [facility] for benefits analysis. Noted start date planned for 02/24/16.  Thank you,  Johny Drilling, PharmD, BCPS 02/09/2016  10:43 AM Oral Chemotherapy Clinic [phone]

## 2016-02-09 NOTE — Progress Notes (Signed)
Oral Chemotherapy Pharmacist Encounter   Received notification from [place] that Xeloda prescription would have to be filled through [place] per [facility]. New Rx referral form completed and faxed to [facility] at [phone] along with copy of insurance card and latest office note. Received successful fax confirmation.  Oral Chemo Clinic will continue to follow.  Johny Drilling, PharmD, BCPS 02/09/2016  4:38 PM Oral Chemotherapy Clinic [phone]

## 2016-02-10 ENCOUNTER — Encounter (HOSPITAL_COMMUNITY): Payer: Self-pay

## 2016-02-10 ENCOUNTER — Ambulatory Visit: Payer: BLUE CROSS/BLUE SHIELD

## 2016-02-10 ENCOUNTER — Encounter: Payer: Self-pay | Admitting: *Deleted

## 2016-02-10 ENCOUNTER — Telehealth: Payer: Self-pay | Admitting: Gastroenterology

## 2016-02-10 ENCOUNTER — Encounter: Payer: Self-pay | Admitting: Hematology

## 2016-02-10 ENCOUNTER — Ambulatory Visit (HOSPITAL_COMMUNITY)
Admission: RE | Admit: 2016-02-10 | Discharge: 2016-02-10 | Disposition: A | Discharge status: Home / Self Care | Payer: BLUE CROSS/BLUE SHIELD | Source: Ambulatory Visit | Attending: Hematology | Admitting: Hematology

## 2016-02-10 DIAGNOSIS — R918 Other nonspecific abnormal finding of lung field: Secondary | ICD-10-CM | POA: Insufficient documentation

## 2016-02-10 DIAGNOSIS — Z85038 Personal history of other malignant neoplasm of large intestine: Secondary | ICD-10-CM | POA: Diagnosis not present

## 2016-02-10 DIAGNOSIS — C186 Malignant neoplasm of descending colon: Secondary | ICD-10-CM

## 2016-02-10 DIAGNOSIS — C189 Malignant neoplasm of colon, unspecified: Secondary | ICD-10-CM | POA: Diagnosis not present

## 2016-02-10 DIAGNOSIS — M47894 Other spondylosis, thoracic region: Secondary | ICD-10-CM | POA: Diagnosis not present

## 2016-02-10 DIAGNOSIS — Z933 Colostomy status: Secondary | ICD-10-CM | POA: Diagnosis not present

## 2016-02-11 ENCOUNTER — Encounter: Payer: Self-pay | Admitting: Gastroenterology

## 2016-02-11 DIAGNOSIS — Z85038 Personal history of other malignant neoplasm of large intestine: Secondary | ICD-10-CM | POA: Diagnosis not present

## 2016-02-11 DIAGNOSIS — Z933 Colostomy status: Secondary | ICD-10-CM | POA: Diagnosis not present

## 2016-02-11 NOTE — Progress Notes (Signed)
Xeloda RX follow-up  Contacted specialty pharmacy [facility], information received that at this time an insurance benefits investigation is being performed.  Plan:  Follow-up 02/14/16 to determine status of RX  Henreitta Leber, PharmD Oral Oncology Navigation [facility]

## 2016-02-12 NOTE — Discharge Summary (Signed)
Physician Discharge Summary  Taylor Whitney [id number] DOB: 1971/01/16 DOA: 01/16/2016  PCP: Taylor Logan, MD  Admit date: 01/16/2016 Discharge date: 8/292017  Time spent: 45 minutes  Recommendations for Outpatient Follow-up:  [facility]  Oncology 02/01/16 [facility] -Surgery Dr.Kinsinger 9/13  Discharge Diagnoses:  Principal Problem:   Large bowel obstruction [facility]) Active Problems:   DM (dermatomyositis)   Depression   Electrolyte abnormality   Cancer of left colon (HCC)   Diabetes mellitus with complication ([place])   Hypokalemia   Discharge Condition: stable  Diet recommendation: soft diet  Filed Weights   01/23/16 0529 01/24/16 0441 01/25/16 0544  Weight: 74.4 kg (164 lb) 76.1 kg (167 lb 12.8 oz) 77.5 kg (170 lb 14.4 oz)    History of present illness:  Taylor Helou Yurchisinis a 45 y.o.femalewith past oral history significant for diabetes and non-Hodgkin's lymphoma presents to emergency room with complaint nausea vomiting. CT abdomen revealed proximal large bowel obstruction  Hospital Course:  AdenoCA colon with Large bowel obstruction: -New Diagnosis -Gi was consulted, s/p FLex sigmoidoscopy noted obstructing tumor in descending colon and Surgery was consulted - s/p L colectomy w/ colostomy 8/22-per Dr.Kinsinger, now POD#6 - Path shows adenocarcinoma, Path staging is T3N1 - Post op ileus improved - NGT removed 8/25, ambulating - diet advanced to soft foods - Oncology FU made with Dr.Feng for 9/5 - FU with [facility] in 1 week  Diabetes Mellitus:A1c 6.9. Held meformin - SSI, stable  Hypokalemia: 2.8. Difficulty w/ repletion throughout admission.  -corrected  Depression:  - resumed Effexor  Procedures:  FLex Sig Dr.Stark  Surgery : Left Colectomy and end colostomy-on 8/22  Discharge Exam: Vitals:   01/25/16 0544 01/25/16 1343  BP: 128/89 114/81  Pulse: (!) 59 88  Resp: 16 17  Temp: 98.4 F (36.9 C) 98.4 F (36.9 C)     General:AAOx3 Cardiovascular: S1S2/RRR Respiratory: CTAB  Discharge Instructions   Discharge Instructions    Diet - low sodium heart healthy    Complete by:  As directed    Increase activity slowly    Complete by:  As directed      Discharge Medication List as of 01/25/2016 12:52 PM    START taking these medications   Details  acetaminophen (TYLENOL) 500 MG tablet Take 1 tablet (500 mg total) by mouth every 6 (six) hours as needed., Starting Tue 01/25/2016, Normal    UNABLE TO FIND This note is to excuse Ms.Taylor Whitney from work from 8/20 through 8/29 due to medical illness requiring hospitalization, ok to return to work by September 8, [employer]    Hydrocodone-Acetaminophen 5-300 MG TABS Take 1 tablet by mouth every 6 (six) hours as needed (for severe pain)., Starting Tue 01/25/2016, Print      CONTINUE these medications which have NOT CHANGED   Details  metFORMIN (GLUCOPHAGE-XR) 500 MG 24 hr tablet Take 500 mg by mouth 2 (two) times daily., Starting Wed 10/27/2015, Historical Med    venlafaxine XR (EFFEXOR-XR) 75 MG 24 hr capsule Take 225 mg by mouth at bedtime., Starting Tue 12/28/2015, Historical Med       Allergies  Allergen Reactions  . Sulfa Antibiotics Rash   Follow-up Information    Mickeal Skinner, MD Follow up on 02/09/2016.   Specialty:  General Surgery Why:  Your appointment for post-operative follow-up is at 10:00 AM. please arrive at 10:15 to get checked in. Contact information: [address] [place] [address] [phone]  Truitt Merle, MD Follow up on 02/01/2016.   Specialties:  Hematology, Oncology Why:  at 11am, please arrive at 10:30am for paperwork Contact information: [address] [place] [address] [facility], [place]. Schedule an appointment as soon as possible for a visit in 1 week(s).   Specialty:  General Surgery Why:  for staple removal.  Contact information: [address] [place] [phone]           The results of significant diagnostics from this hospitalization (including imaging, microbiology, ancillary and laboratory) are listed below for reference.    Significant Diagnostic Studies: Dg Abd 1 View  Result Date: 01/22/2016 CLINICAL DATA:  Evaluate ileus EXAM: ABDOMEN - 1 VIEW COMPARISON:  01/21/2016 FINDINGS: There is a surgical drain overlying the left hemi abdomen. The nasogastric tube is been removed. No dilated loops of small or large bowel identified. Enteric contrast material is identified within right lower quadrant bowel loops. IMPRESSION: 1. No abnormal bowel dilatation identified. Electronically Signed   By: Kerby Moors M.D.   On: 01/22/2016 19:24   Dg Abd 1 View  Result Date: 01/17/2016 CLINICAL DATA:  Large bowel obstruction. EXAM: ABDOMEN - 1 VIEW COMPARISON:  CT 01/16/2016.  Abdominal series 820 2017. FINDINGS: Persistent right and transverse colon mild distention. No small bowel distention. No free air. No acute bony abnormality. IMPRESSION: Persistent right and transverse colon mild distention. No small bowel distention. The Electronically Signed   By: Marcello Moores  Register   On: 01/17/2016 07:05   Ct Chest Wo Contrast  Result Date: 02/10/2016 CLINICAL DATA:  Colon cancer diagnosed in August 2017. Colon resection with colostomy. EXAM: CT CHEST WITHOUT CONTRAST TECHNIQUE: Multidetector CT imaging of the chest was performed following the standard protocol without IV contrast. COMPARISON:  01/16/2016 FINDINGS: Cardiovascular: Unremarkable Mediastinum/Nodes: 4 by 5 mm calcified mediastinal lymph node anterior to the aortic arch, image 46/2. Lungs/Pleura: 2 by 3 mm ground-glass density nodule in the apical segment right upper lobe image 25/5. 3 mm ground-glass density nodule in the left upper lobe image 40/5. Focal thickening or small fluid density lesion along the left posteromedial hemidiaphragm  on image 114/602 and images 102-106 of series 2. Not appreciably changed from 07/27/2006. Upper Abdomen: Unremarkable Musculoskeletal: Thoracic spondylosis. Fused left first and second ribs. IMPRESSION: 1. In both upper lobes there are single 3 mm ground-glass density nodules. No follow-up needed if patient is low-risk (and has no known or suspected primary neoplasm). Non-contrast chest CT can be considered in 12 months if patient is high-risk. This recommendation follows the consensus statement: Guidelines for Management of Incidental Pulmonary Nodules Detected on CT Images: From the Fleischner Society 2017; Radiology 2017; 284:228-243. 2. Focal thickening or small fluid density lesion along the left posteromedial hemidiaphragm, not changed from 2008, considered benign. 3. Thoracic spondylosis. 4. No characteristic findings for metastatic disease to the chest. Electronically Signed   By: Van Clines M.D.   On: 02/10/2016 11:06   Ct Abdomen Pelvis W Contrast  Result Date: 01/16/2016 CLINICAL DATA:  Constipation. Vomiting and abdominal pain, 2 days duration. EXAM: CT ABDOMEN AND PELVIS WITH CONTRAST TECHNIQUE: Multidetector CT imaging of the abdomen and pelvis was performed using the standard protocol following bolus administration of intravenous contrast. CONTRAST:  135mL ISOVUE-300 IOPAMIDOL (ISOVUE-300) INJECTION 61% COMPARISON:  07/27/2006. FINDINGS: Lower chest: Lungs are clear. There is focal thickening of or along the left diaphragm posterior medially. This was  present in 2008 and has not changed. It could represent a benign mesenchymal mass. It probably does not require further follow-up. Hepatobiliary: Mild fatty change of the liver. No focal lesion. No calcified gallstones. Pancreas: Normal Spleen: Normal Adrenals/Urinary Tract: Adrenal glands are normal. Kidneys are normal. No cyst, mass, stone or hydronephrosis. Stomach/Bowel: The right colon, transverse colon and proximal descending colon are  dilated and full of fluid and stool. There is a distinct caliber change at the mid descending colon. This could be due to a mass or a stricture. There is mild adjacent fat edema. Early perforation not excluded. No evidence of free air. The dilated portion of the colon shows wall thickening and mild stranding in the surrounding fat. This could be inflammation due to the obstruction. Vascular/Lymphatic: Aorta and IVC are normal. No retroperitoneal mass or lymphadenopathy. Reproductive: 2.6 cm leiomyoma projecting from the right fundus of the uterus. Both ovaries and adnexal regions appear normal. Other: None. Musculoskeletal:  Chronic disc herniations at L2-3, L3-4 and L4-5. IMPRESSION: Pattern of colon obstruction at the mid descending colon. Proximal to that, the colon is markedly dilated with fluid, air and fecal matter. This could be due to a stricture or mass. In the obstructive portion, there is wall thickening and mild edema of the surrounding fat that could be secondary to the obstruction. Small amount of edema in the fat at the site of obstruction is also noted. No definite perforation or free air however. No evidence of adenopathy. Electronically Signed   By: Nelson Chimes M.D.   On: 01/16/2016 13:03   Dg Abd Acute W/chest  Result Date: 01/16/2016 CLINICAL DATA:  Constipation starting Friday, vomiting, nausea EXAM: DG ABDOMEN ACUTE W/ 1V CHEST COMPARISON:  07/27/2006 FINDINGS: Cardiomediastinal silhouette is unremarkable. No acute infiltrate or pulmonary edema. There is distension of the right colon and transverse colon with gas and fluid. Paucity of bowel gas in distal colon. Colonic ileus or colonic obstruction cannot be excluded. No evidence of free abdominal air. Clinical correlation is necessary. IMPRESSION: No acute disease within chest.There is distension of the right colon and transverse colon with gas and fluid. Paucity of bowel gas in distal colon. Colonic ileus or colonic obstruction cannot be  excluded. No evidence of free abdominal air. Clinical correlation is necessary. Electronically Signed   By: Lahoma Crocker M.D.   On: 01/16/2016 10:19   Dg Duanne Limerick W/water Sol Cm  Result Date: 01/21/2016 CLINICAL DATA:  Assess for leak in the fourth portion of the duodenum. Duodenotomy during tumor resection, on 01/18/2016, repaired. EXAM: WATER SOLUBLE UPPER GI SERIES TECHNIQUE: Single-column upper GI series was performed using water soluble contrast. CONTRAST:  150 cc Omnipaque 300 COMPARISON:  None. FLUOROSCOPY TIME:  Radiation Exposure Index (as provided by the fluoroscopic device): If the device does not provide the exposure index: Fluoroscopy Time (in minutes and seconds):  2 minutes come 0 seconds Number of Acquired Images:  2 FINDINGS: 150 cc of water-soluble contrast was instilled via the nasogastric tube. On the KUB, a left flank drain is also in place and skin clips from laparotomy are noted. I was able to the turned the patient up on her right side in order to facilitate dumping of contrast medium into the duodenum. I would in turn the patient up on the left side in order to facilitate transfer of the duodenal contrast into the distal duodenum. I obtain fluoroscopic spot images at numerous degrees of rotation as I turned the patient back towards her back. There  does appear to be some slight narrowing of the fourth portion of the duodenum for example on series 24, but I perceive no leak from the duodenum. Contrast medium extends into the jejunum. I obtained a follow up KUB after the fluoroscopic portion of the exam and it demonstrates no funneling of contrast outside of bowel in the vicinity of the prior duodenotomy. IMPRESSION: 1. No leak identified. There is some slight narrowing of the junction of the duodenum and jejunum in the vicinity of the prior duodenotomy repair. 2. Today' s exam was performed with water-soluble contrast, and is optimized specifically to assess the distal duodenum. Electronically  Signed   By: Van Clines M.D.   On: 01/21/2016 11:18    Microbiology: No results found for this or any previous visit (from the past 240 hour(s)).   Labs: Basic Metabolic Panel: No results for input(s): NA, K, CL, CO2, GLUCOSE, BUN, CREATININE, CALCIUM, MG, PHOS in the last 168 hours. Liver Function Tests: No results for input(s): AST, ALT, ALKPHOS, BILITOT, PROT, ALBUMIN in the last 168 hours. No results for input(s): LIPASE, AMYLASE in the last 168 hours. No results for input(s): AMMONIA in the last 168 hours. CBC: No results for input(s): WBC, NEUTROABS, HGB, HCT, MCV, PLT in the last 168 hours. Cardiac Enzymes: No results for input(s): CKTOTAL, CKMB, CKMBINDEX, TROPONINI in the last 168 hours. BNP: BNP (last 3 results) No results for input(s): BNP in the last 8760 hours.  ProBNP (last 3 results) No results for input(s): PROBNP in the last 8760 hours.  CBG: No results for input(s): GLUCAP in the last 168 hours.     SignedDomenic Polite MD.  [facility] 02/12/2016, 3:50 PM

## 2016-02-14 ENCOUNTER — Other Ambulatory Visit: Payer: Self-pay | Admitting: *Deleted

## 2016-02-14 DIAGNOSIS — C186 Malignant neoplasm of descending colon: Secondary | ICD-10-CM

## 2016-02-16 ENCOUNTER — Other Ambulatory Visit (HOSPITAL_BASED_OUTPATIENT_CLINIC_OR_DEPARTMENT_OTHER): Payer: BLUE CROSS/BLUE SHIELD

## 2016-02-16 ENCOUNTER — Other Ambulatory Visit: Payer: Self-pay | Admitting: *Deleted

## 2016-02-16 ENCOUNTER — Ambulatory Visit (HOSPITAL_BASED_OUTPATIENT_CLINIC_OR_DEPARTMENT_OTHER): Payer: BLUE CROSS/BLUE SHIELD | Admitting: Hematology

## 2016-02-16 ENCOUNTER — Ambulatory Visit (HOSPITAL_BASED_OUTPATIENT_CLINIC_OR_DEPARTMENT_OTHER): Payer: BLUE CROSS/BLUE SHIELD | Admitting: Genetic Counselor

## 2016-02-16 ENCOUNTER — Telehealth: Payer: Self-pay | Admitting: Hematology

## 2016-02-16 ENCOUNTER — Telehealth: Payer: Self-pay | Admitting: *Deleted

## 2016-02-16 ENCOUNTER — Encounter: Payer: Self-pay | Admitting: Genetic Counselor

## 2016-02-16 VITALS — BP 138/88 | HR 89 | Temp 98.6°F | Resp 20 | Ht 63.0 in | Wt 154.3 lb

## 2016-02-16 DIAGNOSIS — Z8051 Family history of malignant neoplasm of kidney: Secondary | ICD-10-CM

## 2016-02-16 DIAGNOSIS — Z8579 Personal history of other malignant neoplasms of lymphoid, hematopoietic and related tissues: Secondary | ICD-10-CM

## 2016-02-16 DIAGNOSIS — E119 Type 2 diabetes mellitus without complications: Secondary | ICD-10-CM

## 2016-02-16 DIAGNOSIS — C186 Malignant neoplasm of descending colon: Secondary | ICD-10-CM

## 2016-02-16 DIAGNOSIS — C801 Malignant (primary) neoplasm, unspecified: Secondary | ICD-10-CM

## 2016-02-16 DIAGNOSIS — D509 Iron deficiency anemia, unspecified: Secondary | ICD-10-CM

## 2016-02-16 LAB — COMPREHENSIVE METABOLIC PANEL
ALBUMIN: 3.4 g/dL — AB (ref 3.5–5.0)
ALK PHOS: 73 U/L (ref 40–150)
ALT: 12 U/L (ref 0–55)
AST: 10 U/L (ref 5–34)
Anion Gap: 13 mEq/L — ABNORMAL HIGH (ref 3–11)
BUN: 9.9 mg/dL (ref 7.0–26.0)
CO2: 24 meq/L (ref 22–29)
Calcium: 9.3 mg/dL (ref 8.4–10.4)
Chloride: 103 mEq/L (ref 98–109)
Creatinine: 0.7 mg/dL (ref 0.6–1.1)
EGFR: >90 mL/min/{1.73_m2} (ref >90)
GLUCOSE: 115 mg/dL (ref 70–140)
POTASSIUM: 2.9 meq/L — AB (ref 3.5–5.1)
SODIUM: 140 meq/L (ref 136–145)
TOTAL PROTEIN: 7 g/dL (ref 6.4–8.3)
Total Bilirubin: 0.84 mg/dL (ref 0.20–1.20)

## 2016-02-16 LAB — CBC WITH DIFFERENTIAL/PLATELET
BASO%: 0.7 % (ref 0.0–2.0)
BASOS ABS: 0.1 10*3/uL (ref 0.0–0.1)
EOS ABS: 0.2 10*3/uL (ref 0.0–0.5)
EOS%: 2.1 % (ref 0.0–7.0)
HCT: 32.7 % — ABNORMAL LOW (ref 34.8–46.6)
HGB: 10.1 g/dL — ABNORMAL LOW (ref 11.6–15.9)
LYMPH%: 30.7 % (ref 14.0–49.7)
MCH: 23.1 pg — AB (ref 25.1–34.0)
MCHC: 30.9 g/dL — ABNORMAL LOW (ref 31.5–36.0)
MCV: 74.7 fL — ABNORMAL LOW (ref 79.5–101.0)
MONO#: 0.6 10*3/uL (ref 0.1–0.9)
MONO%: 6.8 % (ref 0.0–14.0)
NEUT#: 5.5 10*3/uL (ref 1.5–6.5)
NEUT%: 59.7 % (ref 38.4–76.8)
Platelets: 263 10*3/uL (ref 145–400)
RBC: 4.38 10*6/uL (ref 3.70–5.45)
RDW: 15.4 % — AB (ref 11.2–14.5)
WBC: 9.2 10*3/uL (ref 3.9–10.3)
lymph#: 2.8 10*3/uL (ref 0.9–3.3)

## 2016-02-16 MED ORDER — POTASSIUM CHLORIDE CRYS ER 20 MEQ PO TBCR: 20.0000 meq | EXTENDED_RELEASE_TABLET | Freq: Every day | ORAL | 0 refills | Status: DC

## 2016-02-16 NOTE — Progress Notes (Signed)
REFERRING PROVIDER: Donald Prose, MD [place], [place] [address]   Truitt Merle, MD  PRIMARY PROVIDER:  Lynne Logan, MD  PRIMARY REASON FOR VISIT:  1. Cancer of left colon ([place])   2. Family history of kidney cancer   3. Cancer [facility])      HISTORY OF PRESENT ILLNESS:   Taylor Whitney, a 45 y.o. female, was seen for a [place] cancer genetics consultation at the request of Dr. Nancy Fetter due to a personal and family history of cancer.  Taylor Whitney presents to clinic today to discuss the possibility of a hereditary predisposition to cancer, genetic testing, and to further clarify her future cancer risks, as well as potential cancer risks for family members.   In 1996, at the age of 61, Taylor Whitney was diagnosed with Hodgkin's Lymphoma. This was treated with chemotherapy.  In 2017, at the age of 89, Taylor Whitney was diagnosed with colon cancer.  MSI was stable.    CANCER HISTORY:  Oncology History   Cancer of left colon [facility])   Staging form: Colon and Rectum, AJCC 7th Edition   - Pathologic stage from 01/18/2016: Stage IIIB (T3, N1a, cM0) - Signed by Truitt Merle, MD on 02/01/2016      Cancer of left colon ([place])   01/17/2016 Initial Diagnosis    Cancer of left colon ([place])      01/17/2016 Procedure    Flexible sigmoidoscopy showed malignant-appearing obstructing tumor in the descending colon, biopsied.      01/18/2016 Surgery    Patient underwent left hemicolectomy       01/18/2016 Pathology Results    Left hemicolectomy showed invasive adenocarcinoma, well differentiated, 4.1 cm, tumor extends into pericolonic soft tissue, lymphovascular invasion is identified, margins were negative, 1 out of 27 lymph nodes were positive.        HORMONAL RISK FACTORS:  Menarche was at age 89.  First live birth at age N/A.  OCP use for approximately <5 years years.  Ovaries intact: yes.  Hysterectomy: no.  Menopausal status: premenopausal.  HRT use: 0 years. Colonoscopy:  yes, colon cancer. Mammogram within the last year: no. Number of breast biopsies: 0. Up to date with pelvic exams:  yes. Any excessive radiation exposure in the past:  no  Past Medical History:  Diagnosis Date  . Adenocarcinoma of descending colon ([place])    Flex sigmoidoscopy biopsy of descending colon mass is adenocarcinoma/notes 01/18/2016  . Cancer ([place])    Hodgkin's lymphoma  . Depression   . Family history of kidney cancer   . Hodgkin lymphoma ([place]) dx'd 01/1995  . Migraine    "maybe once/month" (01/18/2016)  . Type II diabetes mellitus ([place])     Past Surgical History:  Procedure Laterality Date  . COLON RESECTION N/A 01/18/2016   Procedure: OPEN PARTIAL COLECTOMY WITH END COLOSTOMY;  Surgeon: Mickeal Skinner, MD;  Location: [place];  Service: General;  Laterality: N/A;  . COLOSTOMY  01/18/2016  . DILATION AND CURETTAGE OF UTERUS    . FLEXIBLE SIGMOIDOSCOPY N/A 01/17/2016   Procedure: FLEXIBLE SIGMOIDOSCOPY;  Surgeon: Ladene Artist, MD;  [facility];  Service: Endoscopy;  Laterality: N/A;  . PARTIAL COLECTOMY  01/18/2016    Social History   Social History  . Marital status: Single    Spouse name: N/A  . Number of children: 0  . Years of education: N/A   Occupational History  . Professor     [employer]  Social History Main Topics  . Smoking status: Never Smoker  . Smokeless tobacco: Never Used  . Alcohol use Yes     Comment: social drinker  . Drug use: No  . Sexual activity: Not Currently   Other Topics Concern  . 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  Significant diagnoses are listed below: Family History  Problem Relation Age of Onset  . Diabetes Mother   . Cancer Father     prostate cancer  . Cancer Paternal Uncle     renal cancer ; smoker  .  Diabetes Maternal Grandmother   . Diabetes Maternal Grandfather   . Clotting disorder Paternal Grandmother   . COPD Paternal Grandfather     The patient does not have children.  She has four sisters none who have cancer.  The patient's parents are both living.  Her mother has two sisters and a brother who are all deceased but did not have cancer.  Her maternal grandparents died at older ages.  The patient's father is alive at 47.  He ha a sister and brother.  The brother died of kidney cancer.  He was a smoker.  There is no other reported family history of cancer.  Patient's maternal ancestors are of [place] descent, and paternal ancestors are of [place] descent. There is reported Ashkenazi Jewish ancestry. There is no known consanguinity.  GENETIC COUNSELING ASSESSMENT: Taylor Whitney is a 45 y.o. female with a personal history of colon cancer and family history of kidney cancer which is somewhat suggestive of a hereditary cancer syndrome and predisposition to cancer. We, therefore, discussed and recommended the following at today's visit.   DISCUSSION: We discussed that about 5-6% of colon cancer is due to hereditary mutations. Most commonly from Lynch syndrome.  Ms. Teague's tumor was tested for MSI and was stable, making Lynch syndrome less likely.  We reviewed the risk for Lynch syndrome, but also discussed that MUTYH mutations result in recessively inherited colon cancer, which could explain her cancer.  Lastly, there is a low to moderate risk mutation in APC that can be found in the [place] population, which we should probably look for as well.  We reviewed the characteristics, features and inheritance patterns of hereditary cancer syndromes. We also discussed genetic testing, including the appropriate family members to test, the process of testing, insurance coverage and turn-around-time for results. We discussed the implications of a negative, positive and/or variant of uncertain  significant result. We recommended Ms. Wilms pursue genetic testing for the Colorectal Cancer gene panel with the MSH2 inversion analysis. The Colorectal Cancer Panel offered by [facility] includes sequencing and/or duplication/deletion testing of the following 19 genes: APC, ATM, AXIN2, BMPR1A, CDH1, CHEK2, EPCAM, MLH1, MSH2, MSH6, MUTYH, PMS2, POLD1, POLE, PTEN, SCG5/GREM1, SMAD4, STK11, and TP53.   Based on Ms. Alkema's personal and family history of cancer, she meets medical criteria for genetic testing. Despite that she meets criteria, she may still have an out of pocket cost. We discussed that if her out of pocket cost for testing is over $100, the laboratory will call and confirm whether she wants to proceed with testing.  If the out of pocket cost of testing is less than $100 she will be billed by the genetic testing laboratory.   PLAN: After  considering the risks, benefits, and limitations, Ms. Deshotel  provided informed consent to pursue genetic testing and the blood sample was sent to [facility] for analysis of the Colorectal cancer panel. Results should be available within approximately 2-3 weeks' time, at which point they will be disclosed by telephone to Ms. Baynes, as will any additional recommendations warranted by these results. Ms. Bottenfield will receive a summary of her genetic counseling visit and a copy of her results once available. This information will also be available in Epic. We encouraged Ms. Chandler to remain in contact with cancer genetics annually so that we can continuously update the family history and inform her of any changes in cancer genetics and testing that may be of benefit for her family. Ms. Bolanos's questions were answered to her satisfaction today. Our contact information was provided should additional questions or concerns arise.  Lastly, we encouraged Ms. Mcghie to remain in contact with cancer genetics annually so that we can continuously  update the family history and inform her of any changes in cancer genetics and testing that may be of benefit for this family.   Ms.  Guidroz's questions were answered to her satisfaction today. Our contact information was provided should additional questions or concerns arise. Thank you for the referral and allowing [place] to share in the care of your patient.   Ryott Rafferty P. Florene Glen, [place], [facility] Certified Genetic Counselor [email or web address] phone: [phone]  The patient was seen for a total of 60 minutes in face-to-face genetic counseling.  This patient was discussed with Drs. Magrinat, Lindi Adie and/or Burr Medico who agrees with the above.    _______________________________________________________________________ For Office Staff:  Number of people involved in session: 1 Was an Intern/ student involved with case: yes Sam [employer]

## 2016-02-16 NOTE — Progress Notes (Signed)
[place]  [phone] [phone]  Clinic Follow up Note   Patient Care Team: Donald Prose, MD as PCP - General (Family Medicine) Truitt Merle, MD as Consulting Physician (Hematology) Mickeal Skinner, MD as Consulting Physician (General Surgery) 02/16/2016   CHIEF COMPLAINTS:  Follow up stage IIIB colon cancer   Oncology History   Cancer of left colon [facility])   Staging form: Colon and Rectum, AJCC 7th Edition   - Pathologic stage from 01/18/2016: Stage IIIB (T3, N1a, cM0) - Signed by Truitt Merle, MD on 02/01/2016      Cancer of left colon ([place])   01/17/2016 Initial Diagnosis    Cancer of left colon ([place])      01/17/2016 Procedure    Flexible sigmoidoscopy showed malignant-appearing obstructing tumor in the descending colon, biopsied.      01/18/2016 Surgery    Patient underwent left hemicolectomy       01/18/2016 Pathology Results    Left hemicolectomy showed invasive adenocarcinoma, well differentiated, 4.1 cm, tumor extends into pericolonic soft tissue, lymphovascular invasion is identified, margins were negative, 1 out of 27 lymph nodes were positive.       HISTORY OF PRESENTING ILLNESS (02/01/2016):  Taylor Whitney 45 y.o. female is here because of her recently diagnosed stage IIIB colon cancer, she presents to my clinic by herself, and her two sisters were on the phone during her visit.   She presents to [facility] hospital on 01/16/2016 with nausea and vomiting for one day. She had a CT scan and flexible sigmoidoscopy which showed a near obstructing mass in the descending colon. She was seen by surgeon Dr. Kieth Brightly and underwent left colectomy and end colostomy. There were minor injury to the duodenum during her surgery, postop study showed no leakage from duodenum. She tolerated surgery and recovered well, was discharged home after 9 days of hospital stay.  She has been recovering well from surgery, has mimimum pain at incisions, her  appetite is still relatively low, her energy level has improved, she table to function and tolerate daily activities without much difficulty. She lost about 20lbs in the past few months.  She was diagnosed with DM 6 months ago, on metformin, does not check her blood glucose at home.  She was diagnosed with HL stage III in 1996, she was diagnosed and treated in [place], s/p chemo ABVD 9 months (bleomycin was held earlier due to pulmonary toxicities), no radiation. She followed with her oncologist for 5 years after treatment.   CURRENT THERAPY: pending adjuvant chemo CAPOX, starting on 02/24/16  INTERIM HISTORY: Taylor Whitney returns for follow-up. She has been doing well overall. Her pain in the incision is minimal now. Her appetite and energy level are pretty much back to her normal, no other new complaints.   MEDICAL HISTORY:  Past Medical History:  Diagnosis Date  . Adenocarcinoma of descending colon ([place])    Flex sigmoidoscopy biopsy of descending colon mass is adenocarcinoma/notes 01/18/2016  . Depression   . Hodgkin lymphoma ([place]) dx'd 01/1995  . Migraine    "maybe once/month" (01/18/2016)  . Type II diabetes mellitus ([place])     SURGICAL HISTORY: Past Surgical History:  Procedure Laterality Date  . COLON RESECTION N/A 01/18/2016   Procedure: OPEN PARTIAL COLECTOMY WITH END COLOSTOMY;  Surgeon: Mickeal Skinner, MD;  Location: [place];  Service: General;  Laterality: N/A;  . COLOSTOMY  01/18/2016  . DILATION AND CURETTAGE OF UTERUS    . FLEXIBLE SIGMOIDOSCOPY N/A 01/17/2016  Procedure: FLEXIBLE SIGMOIDOSCOPY;  Surgeon: Ladene Artist, MD;  Location: [facility] ENDOSCOPY;  Service: Endoscopy;  Laterality: N/A;  . PARTIAL COLECTOMY  01/18/2016    SOCIAL HISTORY: Social History   Social History  . Marital status: Single    Spouse name: N/A  . Number of children: 0  . Years of education: N/A   Occupational History  . Professor     [employer]   Social History Main  Topics  . Smoking status: Never Smoker  . Smokeless tobacco: Never Used  . Alcohol use Yes     Comment: social drinker  . Drug use: No  . Sexual activity: Not Currently   Other Topics Concern  . 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 Diabetes Mother   . Cancer Father     prostate cancer  . Cancer Paternal Uncle     renal cancer     ALLERGIES:  is allergic to sulfa antibiotics.  MEDICATIONS:  Current Outpatient Prescriptions  Medication Sig Dispense Refill  . acetaminophen (TYLENOL) 500 MG tablet Take 1 tablet (500 mg total) by mouth every 6 (six) hours as needed. 30 tablet 0  . capecitabine (XELODA) 500 MG tablet Take on days 1-14 of chemotherapy. 98 tablet 3  . metFORMIN (GLUCOPHAGE-XR) 500 MG 24 hr tablet Take 500 mg by mouth 2 (two) times daily.  0  . ondansetron (ZOFRAN) 8 MG tablet Take 1 tablet (8 mg total) by mouth 2 (two) times daily as needed for refractory nausea / vomiting. Start on day 3 after chemotherapy. 30 tablet 1  . prochlorperazine (COMPAZINE) 10 MG tablet Take 1 tablet (10 mg total) by mouth every 6 (six) hours as needed (Nausea or vomiting). 30 tablet 1  . UNABLE TO FIND This note is to excuse Ms.Keelie Rauf from work from 8/20 through 8/29 due to medical illness requiring hospitalization, ok to return to work by September 8 1 each 0  . venlafaxine XR (EFFEXOR-XR) 75 MG 24 hr capsule Take 225 mg by mouth at bedtime.  5   No current facility-administered medications for this visit.     REVIEW OF SYSTEMS:   Constitutional: Denies fevers, chills or abnormal night sweats Eyes: Denies blurriness of vision, double vision or watery eyes Ears, nose, mouth, throat, and face: Denies mucositis or sore  throat Respiratory: Denies cough, dyspnea or wheezes Cardiovascular: Denies palpitation, chest discomfort or lower extremity swelling Gastrointestinal:  Denies nausea, heartburn or change in bowel habits Skin: Denies abnormal skin rashes Lymphatics: Denies new lymphadenopathy or easy bruising Neurological:Denies numbness, tingling or new weaknesses Behavioral/Psych: Mood is stable, no new changes  All other systems were reviewed with the patient and are negative.  PHYSICAL EXAMINATION: ECOG PERFORMANCE STATUS: 1 - Symptomatic but completely ambulatory  Vitals:   02/16/16 1209  BP: 138/88  Pulse: 89  Resp: 20  Temp: 98.6 F (37 C)   Filed Weights   02/16/16 1209  Weight: 154 lb 4.8 oz (70 kg)    GENERAL:alert, no distress and comfortable SKIN: skin color, texture, turgor are normal, no rashes or significant lesions EYES: normal, conjunctiva are pink and non-injected, sclera clear OROPHARYNX:no exudate, no erythema and lips, buccal mucosa, and  tongue normal  NECK: supple, thyroid normal size, non-tender, without nodularity LYMPH:  no palpable lymphadenopathy in the cervical, axillary or inguinal LUNGS: clear to auscultation and percussion with normal breathing effort HEART: regular rate & rhythm and no murmurs and no lower extremity edema ABDOMEN:abdomen soft, non-tender and normal bowel sounds, (+) mild tenderness in the right side abdomen, midline surgical incision appears clean, without discharge or skin erythema. Musculoskeletal:no cyanosis of digits and no clubbing  PSYCH: alert & oriented x 3 with fluent speech NEURO: no focal motor/sensory deficits  LABORATORY DATA:  I have reviewed the data as listed CBC Latest Ref Rng & Units 02/16/2016 02/01/2016 01/24/2016  WBC 3.9 - 10.3 10e3/uL 9.2 10.7(H) 10.3  Hemoglobin 11.6 - 15.9 g/dL 10.1(L) 10.7(L) 9.9(L)  Hematocrit 34.8 - 46.6 % 32.7(L) 33.4(L) 31.7(L)  Platelets 145 - 400 10e3/uL 263 436(H) 416(H)   CMP Latest Ref Rng &  Units 02/16/2016 02/01/2016 01/25/2016  Glucose 70 - 140 mg/dl 115 94 118(H)  BUN 7.0 - 26.0 mg/dL 9.9 11.4 6  Creatinine 0.6 - 1.1 mg/dL 0.7 0.7 0.66  Sodium 136 - 145 mEq/L 140 142 139  Potassium 3.5 - 5.1 mEq/L 2.9(LL) 3.3(L) 3.1(L)  Chloride 101 - 111 mmol/L - - 101  CO2 22 - 29 mEq/L '24 25 27  ' Calcium 8.4 - 10.4 mg/dL 9.3 9.6 9.2  Total Protein 6.4 - 8.3 g/dL 7.0 7.7 -  Total Bilirubin 0.20 - 1.20 mg/dL 0.84 0.53 -  Alkaline Phos 40 - 150 U/L 73 83 -  AST 5 - 34 U/L 10 13 -  ALT 0 - 55 U/L 12 12 -   Results for TAHRA, HITZEMAN (MRN [id number]) as of 02/01/2016 07:28  Ref. Range 01/16/2016 14:50  CEA Latest Ref Range: 0.0 - 4.7 ng/mL 1.7   Results for ADIN, LARICCIA (MRN [id number]) as of 02/20/2016 16:49  Ref. Range 02/01/2016 13:48  Iron Latest Ref Range: 41 - 142 ug/dL 15 (L)  UIBC Latest Ref Range: 120 - 384 ug/dL 312  TIBC Latest Ref Range: 236 - 444 ug/dL 328  %SAT Latest Ref Range: 21 - 57 % 5 (L)  Ferritin Latest Ref Range: 9 - 269 ng/ml 22    PATHOLOGY REPORT  Diagnosis 01/18/2016 Colon, segmental resection for tumor, Left - INVASIVE ADENOCARCINOMA, WELL DIFFERENTIATED, SPANNING 4.1 CM. - ADENOCARCINOMA EXTENDS INTO PERICOLONIC SOFT TISSUE. - LYMPHOVASCULAR INVASION IS IDENTIFIED. - THE SURGICAL RESECTION MARGINS ARE NEGATIVE FOR CARCINOMA. - METASTATIC CARCINOMA IN 1 OF 27 LYMPH NODES (1/27). - DIVERTICULAR DISEASE (GROSS DIAGNOSIS). - SEE ONCOLOGY TABLE BELOW. Microscopic Comment COLON AND RECTUM (INCLUDING TRANS-ANAL RESECTION): Specimen: Left colon. Procedure: Resection. Tumor site: Central portion of specimen. Specimen integrity: Intact. Macroscopic intactness of mesorectum: N/A Macroscopic tumor perforation: Not identified. Invasive tumor: Maximum size: 4.1 cm Histologic type(s): Adenocarcinoma Histologic grade and differentiation: G1: well differentiated/low grade. Type of polyp in which invasive carcinoma arose: Likely tubular  adenoma. Microscopic extension of invasive tumor: Extends into the pericolonic soft tissue. Lymph-Vascular invasion: Present, focal Peri-neural invasion: Not identified. Tumor deposit(s) (discontinuous extramural extension): Not identified. Resection margins: Proximal margin: 14.1 cm Distal margin: 15.8 cm Circumferential (radial) (posterior ascending, posterior descending; lateral and posterior mid-rectum; and entire lower 1/3 rectum): 3.0 cm 1 of 3 FINAL for IDELLE, REIMANN ([id number]) Microscopic Comment(continued) Treatment effect (neo-adjuvant therapy): N/A Additional polyp(s): Not identified. Non-neoplastic findings: Diverticular disease and splenule. Lymph nodes: number examined - 27; number positive: 1 Pathologic Staging: pT3, pN1a Ancillary studies: A block will be  sent for MMR testing by [facility] and a separate block will be sent for MSI testing by PCR. Additional studies can be performed upon clinician request. (JBK:gt, 01/19/16) Enid Cutter MD Pathologist, Electronic Signature (Case signed 01/19/2016)     Diagnosis 01/17/2016 Colon, biopsy, descending - POSITIVE FOR ADENOCARCINOMA. Microscopic Comment The findings are called to Dr. Fuller Plan on 01/18/16. Dr. Orene Desanctis has seen this case in consultation with agreement. (RAH;gt, 01/18/16)   RADIOGRAPHIC STUDIES: I have personally reviewed the radiological images as listed and agreed with the findings in the report. Dg Abd 1 View  Result Date: 01/22/2016 CLINICAL DATA:  Evaluate ileus EXAM: ABDOMEN - 1 VIEW COMPARISON:  01/21/2016 FINDINGS: There is a surgical drain overlying the left hemi abdomen. The nasogastric tube is been removed. No dilated loops of small or large bowel identified. Enteric contrast material is identified within right lower quadrant bowel loops. IMPRESSION: 1. No abnormal bowel dilatation identified. Electronically Signed   By: Kerby Moors M.D.   On: 01/22/2016 19:24   Ct Chest Wo Contrast  Result  Date: 02/10/2016 CLINICAL DATA:  Colon cancer diagnosed in August 2017. Colon resection with colostomy. EXAM: CT CHEST WITHOUT CONTRAST TECHNIQUE: Multidetector CT imaging of the chest was performed following the standard protocol without IV contrast. COMPARISON:  01/16/2016 FINDINGS: Cardiovascular: Unremarkable Mediastinum/Nodes: 4 by 5 mm calcified mediastinal lymph node anterior to the aortic arch, image 46/2. Lungs/Pleura: 2 by 3 mm ground-glass density nodule in the apical segment right upper lobe image 25/5. 3 mm ground-glass density nodule in the left upper lobe image 40/5. Focal thickening or small fluid density lesion along the left posteromedial hemidiaphragm on image 114/602 and images 102-106 of series 2. Not appreciably changed from 07/27/2006. Upper Abdomen: Unremarkable Musculoskeletal: Thoracic spondylosis. Fused left first and second ribs. IMPRESSION: 1. In both upper lobes there are single 3 mm ground-glass density nodules. No follow-up needed if patient is low-risk (and has no known or suspected primary neoplasm). Non-contrast chest CT can be considered in 12 months if patient is high-risk. This recommendation follows the consensus statement: Guidelines for Management of Incidental Pulmonary Nodules Detected on CT Images: From the Fleischner Society 2017; Radiology 2017; 284:228-243. 2. Focal thickening or small fluid density lesion along the left posteromedial hemidiaphragm, not changed from 2008, considered benign. 3. Thoracic spondylosis. 4. No characteristic findings for metastatic disease to the chest. Electronically Signed   By: Van Clines M.D.   On: 02/10/2016 11:06   Dg Duanne Limerick W/water Sol Cm  Result Date: 01/21/2016 CLINICAL DATA:  Assess for leak in the fourth portion of the duodenum. Duodenotomy during tumor resection, on 01/18/2016, repaired. EXAM: WATER SOLUBLE UPPER GI SERIES TECHNIQUE: Single-column upper GI series was performed using water soluble contrast. CONTRAST:  150  cc Omnipaque 300 COMPARISON:  None. FLUOROSCOPY TIME:  Radiation Exposure Index (as provided by the fluoroscopic device): If the device does not provide the exposure index: Fluoroscopy Time (in minutes and seconds):  2 minutes come 0 seconds Number of Acquired Images:  2 FINDINGS: 150 cc of water-soluble contrast was instilled via the nasogastric tube. On the KUB, a left flank drain is also in place and skin clips from laparotomy are noted. I was able to the turned the patient up on her right side in order to facilitate dumping of contrast medium into the duodenum. I would in turn the patient up on the left side in order to facilitate transfer of the duodenal contrast into the distal duodenum. I obtain fluoroscopic spot images  at numerous degrees of rotation as I turned the patient back towards her back. There does appear to be some slight narrowing of the fourth portion of the duodenum for example on series 24, but I perceive no leak from the duodenum. Contrast medium extends into the jejunum. I obtained a follow up KUB after the fluoroscopic portion of the exam and it demonstrates no funneling of contrast outside of bowel in the vicinity of the prior duodenotomy. IMPRESSION: 1. No leak identified. There is some slight narrowing of the junction of the duodenum and jejunum in the vicinity of the prior duodenotomy repair. 2. Today' s exam was performed with water-soluble contrast, and is optimized specifically to assess the distal duodenum. Electronically Signed   By: Van Clines M.D.   On: 01/21/2016 11:18   Sigmoidoscopy 01/17/2016 - Malignant appearing, obstructing tumor in the descending colon. Biopsied. - Otherwise normal flex sig however limited by poor prep.  ASSESSMENT & PLAN:  45 year old with past medical history of diabetes, remote history of Hodgkin lymphoma, presented with bowel obstruction.  1. Cancer of left colon, invasive adenocarcinoma, well-differentiated, pT3N1aM0, stage IIIB,  MSI-stable  -I reviewed her surgical pathology findings with patient and her sisters in great details -Her CT of the abdomen was negative for distant metastasis -I reviewed her staging CT chest which showed no evidence of metastasis. -We discussed risk of cancer recurrence after complete surgical resection. Given her positive lymph nodes, stage IIIB disease, LVI (+), she is at high risk for recurrence.  -We discussed the standard care for stage III colon cancer is adjuvant chemotherapy. Given his young age, I recommend combined chemotherapy, FOLFOX or CAPEOX. Due to his work schedule, she opted CAPEOX. -We discussed the new data from pooled 6 phase 3 trial investigating duration of adjuvant oxaliplatin-based therapy (3 versus 6 months) for stage III colon cancer which was reported in ASCO 2017 recently, showed similar three-year disease-free survival of 3 months versus 6 months in patients with T1-3N1 disease. Based on the new data, I recommend her to have 3 months (instead of 6 months) CAPEOX, chemo consent obtianed -she is scheduled to star on 9/28 -She had prolonged chemotherapy ABVD for her Hodgkin lymphoma 20 years ago, her marrow reservation may be low, we will watch her blood counts carefully. -Lab reviewed with her, her anemia is stable, the rest of CBC and CMP are unremarkable.  2. Iron deficient anemia -Her on study showed iron deficiency, probably from her previous GI bleeding from her colon cancer. -We discussed the treatment option of oral iron supplement versus IV iron. She will start ferrous sulfate 1-2 tablets a day, with vitamin C or orange juice.  If she has poor response or tolerance to oral iron, I'll consider IV iron.  3. Genetics -Her colon cancer is MSI-stable, she has no family history of colon cancer, this is unlikely Lynch syndrome -Given her young age, I'll refer her to see genetic counselor in our cancer center to ruled out other inheritable cancer syndromes.  4.  DM -She was diagnosed with diabetes in earlier 2017. She is on metformin -I strongly encouraged her to monitor her blood sugar at home -We discussed chemotherapy may impact her blood sugar level, especially premedication dexamethasone.    PLAN -CT chest result reviewed with her -She will start first cycle K proximal on September 28, she is still waiting Xeloda to be delivered -I plan to see her back one week after first cycle chemotherapy, for toxicity check up.  No orders of the defined types were placed in this encounter.   All questions were answered. The patient knows to call the clinic with any problems, questions or concerns. I spent 25 minutes counseling the patient face to face. The total time spent in the appointment was 30 minutes and more than 50% was on counseling.     Truitt Merle, MD 02/16/2016

## 2016-02-16 NOTE — Telephone Encounter (Signed)
Gave patient avs report and appointments for September and October  °

## 2016-02-16 NOTE — Telephone Encounter (Signed)
Spoke with patient.  Let her know that from her lab work today her K+ is low.  She needs to start on K+ pills.  Will call into her pharmacy and she needs to start on this tonight and continue until she sees Dr. Burr Medico.  Also gave her some ways she can increase K+ in her diet.

## 2016-02-16 NOTE — Telephone Encounter (Signed)
Called patient and left message.  She may get a flu shot with her PCP or at any pharmacy.  If she would like to get one here - just call us and we will schedule an appt. For her to get her flu vaccine.

## 2016-02-17 ENCOUNTER — Telehealth: Payer: Self-pay | Admitting: Pharmacist

## 2016-02-17 NOTE — Telephone Encounter (Signed)
Oral Chemotherapy Pharmacist Encounter   I called [facility] [employer] for update on Xeloda Rx faxed on 02/09/16. Per documentation from C.Jones on 9/15, Rx was in benefits investigation at that time.  Per my discussion today, Rx is still in benefits investigation d/t [facility] needing patient's insurance information, which was sent on 02/09/16 along with prescription referral form. Insurance information provided over the phone.   Formal complaint to [employer] also given over the phone due to already sending this information requested and that no one had reached back out to [place] to get insurance information despite being told 6 days ago that Rx was received and bring processed by [facility].  Prescription is now under expedited review at [facility].  I LVM for patient about the delay of her prescription and that she should expect a call from [place] or [facility] about the status of her Xeloda Rx in the next few days.  Oral Chemo Clinic will continue to follow.  Johny Drilling, PharmD, BCPS 02/17/2016  1:01 PM Oral Chemotherapy Clinic [phone]

## 2016-02-18 ENCOUNTER — Telehealth: Payer: Self-pay | Admitting: Pharmacist

## 2016-02-18 ENCOUNTER — Telehealth: Payer: Self-pay

## 2016-02-18 NOTE — Telephone Encounter (Signed)
Mailed letter to [employer] and [employer] for disability

## 2016-02-18 NOTE — Telephone Encounter (Signed)
Received covermymeds fax this AM (02/18/16) stating [place] needed PA for capecitabine. I subsequently submitted PA on covermymeds and received approval effective 02/18/16 through 02/16/17. I then called [facility] and informed them the PA has been approved.  A pharmacist tried to process the Rx and said there was still rejection from insurance. I informed the pharmacist that I completed the PA by answering "Brand not necessary" so the generic could be used. The pharmacist seemed to have difficulty processing the script and told me that I needed to contact the insurance company.  To which I responded with the question, "why should I contact the insurance company when you are the ones filling the script?  I've done exactly what you wanted [place] to do by obtaining the PA." The pharmacist saw in their system that a complaint was filed yesterday and I explained the urgency of the matter given the script was sent in to them on 02/09/16 and still has not been filled. The pharmacist stated they would call the insurance company and fill the script today if possible.  She would inform her "senior" supervisor of our concerns. We'll need to f/u w/ [facility] on Monday

## 2016-02-20 ENCOUNTER — Encounter: Payer: Self-pay | Admitting: Hematology

## 2016-02-24 ENCOUNTER — Ambulatory Visit (HOSPITAL_BASED_OUTPATIENT_CLINIC_OR_DEPARTMENT_OTHER): Payer: BLUE CROSS/BLUE SHIELD

## 2016-02-24 ENCOUNTER — Encounter: Payer: Self-pay | Admitting: *Deleted

## 2016-02-24 ENCOUNTER — Other Ambulatory Visit: Payer: Self-pay | Admitting: Hematology

## 2016-02-24 VITALS — BP 134/89 | HR 87 | Temp 98.6°F | Resp 20

## 2016-02-24 DIAGNOSIS — C186 Malignant neoplasm of descending colon: Secondary | ICD-10-CM | POA: Diagnosis not present

## 2016-02-24 DIAGNOSIS — Z5111 Encounter for antineoplastic chemotherapy: Secondary | ICD-10-CM | POA: Diagnosis not present

## 2016-02-24 LAB — COMPREHENSIVE METABOLIC PANEL
ALT: 11 U/L (ref 0–55)
ANION GAP: 11 meq/L (ref 3–11)
AST: 10 U/L (ref 5–34)
Albumin: 3.5 g/dL (ref 3.5–5.0)
Alkaline Phosphatase: 74 U/L (ref 40–150)
BILIRUBIN TOTAL: 0.69 mg/dL (ref 0.20–1.20)
BUN: 10.7 mg/dL (ref 7.0–26.0)
CALCIUM: 9.4 mg/dL (ref 8.4–10.4)
CHLORIDE: 106 meq/L (ref 98–109)
CO2: 21 meq/L — AB (ref 22–29)
Creatinine: 0.7 mg/dL (ref 0.6–1.1)
Glucose: 157 mg/dl — ABNORMAL HIGH (ref 70–140)
Potassium: 4.2 mEq/L (ref 3.5–5.1)
Sodium: 139 mEq/L (ref 136–145)
TOTAL PROTEIN: 7.1 g/dL (ref 6.4–8.3)

## 2016-02-24 LAB — CBC WITH DIFFERENTIAL/PLATELET
BASO%: 0.7 % (ref 0.0–2.0)
BASOS ABS: 0.1 10*3/uL (ref 0.0–0.1)
EOS ABS: 0.2 10*3/uL (ref 0.0–0.5)
EOS%: 2.7 % (ref 0.0–7.0)
HCT: 33.3 % — ABNORMAL LOW (ref 34.8–46.6)
HGB: 10.5 g/dL — ABNORMAL LOW (ref 11.6–15.9)
LYMPH%: 29.8 % (ref 14.0–49.7)
MCH: 23.8 pg — ABNORMAL LOW (ref 25.1–34.0)
MCHC: 31.6 g/dL (ref 31.5–36.0)
MCV: 75.2 fL — AB (ref 79.5–101.0)
MONO#: 0.6 10*3/uL (ref 0.1–0.9)
MONO%: 6.8 % (ref 0.0–14.0)
NEUT#: 5.2 10*3/uL (ref 1.5–6.5)
NEUT%: 60 % (ref 38.4–76.8)
PLATELETS: 329 10*3/uL (ref 145–400)
RBC: 4.43 10*6/uL (ref 3.70–5.45)
RDW: 16.4 % — ABNORMAL HIGH (ref 11.2–14.5)
WBC: 8.7 10*3/uL (ref 3.9–10.3)
lymph#: 2.6 10*3/uL (ref 0.9–3.3)

## 2016-02-24 MED ORDER — SODIUM CHLORIDE 0.9 % IV SOLN
10.0000 mg | Freq: Once | INTRAVENOUS | Status: AC
  Administered 2016-02-24: 10 mg via INTRAVENOUS
  Filled 2016-02-24: qty 1

## 2016-02-24 MED ORDER — PROCHLORPERAZINE MALEATE 10 MG PO TABS
ORAL_TABLET | ORAL | Status: AC
  Filled 2016-02-24: qty 1

## 2016-02-24 MED ORDER — OXALIPLATIN CHEMO INJECTION 100 MG/20ML
130.0000 mg/m2 | Freq: Once | INTRAVENOUS | Status: AC
  Administered 2016-02-24: 225 mg via INTRAVENOUS
  Filled 2016-02-24: qty 40

## 2016-02-24 MED ORDER — DEXTROSE 5 % IV SOLN
Freq: Once | INTRAVENOUS | Status: AC
  Administered 2016-02-24: 12:00:00 via INTRAVENOUS

## 2016-02-24 MED ORDER — PALONOSETRON HCL INJECTION 0.25 MG/5ML
0.2500 mg | Freq: Once | INTRAVENOUS | Status: AC
  Administered 2016-02-24: 0.25 mg via INTRAVENOUS

## 2016-02-24 MED ORDER — PALONOSETRON HCL INJECTION 0.25 MG/5ML
INTRAVENOUS | Status: AC
  Filled 2016-02-24: qty 5

## 2016-02-24 MED ORDER — INFLUENZA VAC SPLIT QUAD 0.5 ML IM SUSY
0.5000 mL | PREFILLED_SYRINGE | Freq: Once | INTRAMUSCULAR | Status: DC
  Filled 2016-02-24: qty 0.5

## 2016-02-24 NOTE — Patient Instructions (Signed)
[facility] Discharge Instructions for Patients Receiving Chemotherapy  Today you received the following chemotherapy agents:  oxaliplatin To help prevent nausea and vomiting after your treatment, we encourage you to take your nausea medication.  Take it as often as prescribed.     If you develop nausea and vomiting that is not controlled by your nausea medication, call the clinic. If it is after clinic hours your family physician or the after hours number for the clinic or go to the Emergency Department.   BELOW ARE SYMPTOMS THAT SHOULD BE REPORTED IMMEDIATELY:  *FEVER GREATER THAN 100.5 F  *CHILLS WITH OR WITHOUT FEVER  NAUSEA AND VOMITING THAT IS NOT CONTROLLED WITH YOUR NAUSEA MEDICATION  *UNUSUAL SHORTNESS OF BREATH  *UNUSUAL BRUISING OR BLEEDING  TENDERNESS IN MOUTH AND THROAT WITH OR WITHOUT PRESENCE OF ULCERS  *URINARY PROBLEMS  *BOWEL PROBLEMS  UNUSUAL RASH Items with * indicate a potential emergency and should be followed up as soon as possible.  One of the nurses will contact you 24 hours after your treatment. Please let the nurse know about any problems that you may have experienced. Feel free to call the clinic you have any questions or concerns. The clinic phone number is [phone].   I have been informed and understand all the instructions given to me. I know to contact the clinic, my physician, or go to the Emergency Department if any problems should occur. I do not have any questions at this time, but understand that I may call the clinic during office hours   should I have any questions or need assistance in obtaining follow up care.    Oxaliplatin Injection What is this medicine? OXALIPLATIN (ox AL i PLA tin) is a chemotherapy drug. It targets fast dividing cells, like cancer cells, and causes these cells to die. This medicine is used to treat cancers of the colon and rectum, and many other cancers. This medicine may be used for other  purposes; ask your health care provider or pharmacist if you have questions. What should I tell my health care provider before I take this medicine? They need to know if you have any of these conditions: -kidney disease -an unusual or allergic reaction to oxaliplatin, other chemotherapy, other medicines, foods, dyes, or preservatives -pregnant or trying to get pregnant -breast-feeding How should I use this medicine? This drug is given as an infusion into a vein. It is administered in a hospital or clinic by a specially trained health care professional. Talk to your pediatrician regarding the use of this medicine in children. Special care may be needed. Overdosage: If you think you have taken too much of this medicine contact a poison control center or emergency room at once. NOTE: This medicine is only for you. Do not share this medicine with others. What if I miss a dose? It is important not to miss a dose. Call your doctor or health care professional if you are unable to keep an appointment. What may interact with this medicine? -medicines to increase blood counts like filgrastim, pegfilgrastim, sargramostim -probenecid -some antibiotics like amikacin, gentamicin, neomycin, polymyxin B, streptomycin, tobramycin -zalcitabine Talk to your doctor or health care professional before taking any of these medicines: -acetaminophen -aspirin -ibuprofen -ketoprofen -naproxen This list may not describe all possible interactions. Give your health care provider a list of all the medicines, herbs, non-prescription drugs, or dietary supplements you use. Also tell them if you smoke, drink alcohol, or use illegal drugs. Some items may  interact with your medicine. What should I watch for while using this medicine? Your condition will be monitored carefully while you are receiving this medicine. You will need important blood work done while you are taking this medicine. This medicine can make you more  sensitive to cold. Do not drink cold drinks or use ice. Cover exposed skin before coming in contact with cold temperatures or cold objects. When out in cold weather wear warm clothing and cover your mouth and nose to warm the air that goes into your lungs. Tell your doctor if you get sensitive to the cold. This drug may make you feel generally unwell. This is not uncommon, as chemotherapy can affect healthy cells as well as cancer cells. Report any side effects. Continue your course of treatment even though you feel ill unless your doctor tells you to stop. In some cases, you may be given additional medicines to help with side effects. Follow all directions for their use. Call your doctor or health care professional for advice if you get a fever, chills or sore throat, or other symptoms of a cold or flu. Do not treat yourself. This drug decreases your body's ability to fight infections. Try to avoid being around people who are sick. This medicine may increase your risk to bruise or bleed. Call your doctor or health care professional if you notice any unusual bleeding. Be careful brushing and flossing your teeth or using a toothpick because you may get an infection or bleed more easily. If you have any dental work done, tell your dentist you are receiving this medicine. Avoid taking products that contain aspirin, acetaminophen, ibuprofen, naproxen, or ketoprofen unless instructed by your doctor. These medicines may hide a fever. Do not become pregnant while taking this medicine. Women should inform their doctor if they wish to become pregnant or think they might be pregnant. There is a potential for serious side effects to an unborn child. Talk to your health care professional or pharmacist for more information. Do not breast-feed an infant while taking this medicine. Call your doctor or health care professional if you get diarrhea. Do not treat yourself. What side effects may I notice from receiving this  medicine? Side effects that you should report to your doctor or health care professional as soon as possible: -allergic reactions like skin rash, itching or hives, swelling of the face, lips, or tongue -low blood counts - This drug may decrease the number of white blood cells, red blood cells and platelets. You may be at increased risk for infections and bleeding. -signs of infection - fever or chills, cough, sore throat, pain or difficulty passing urine -signs of decreased platelets or bleeding - bruising, pinpoint red spots on the skin, black, tarry stools, nosebleeds -signs of decreased red blood cells - unusually weak or tired, fainting spells, lightheadedness -breathing problems -chest pain, pressure -cough -diarrhea -jaw tightness -mouth sores -nausea and vomiting -pain, swelling, redness or irritation at the injection site -pain, tingling, numbness in the hands or feet -problems with balance, talking, walking -redness, blistering, peeling or loosening of the skin, including inside the mouth -trouble passing urine or change in the amount of urine Side effects that usually do not require medical attention (report to your doctor or health care professional if they continue or are bothersome): -changes in vision -constipation -hair loss -loss of appetite -metallic taste in the mouth or changes in taste -stomach pain This list may not describe all possible side effects. Call your doctor for  medical advice about side effects. You may report side effects to FDA at 1-800-FDA-1088. Where should I keep my medicine? This drug is given in a hospital or clinic and will not be stored at home. NOTE: This sheet is a summary. It may not cover all possible information. If you have questions about this medicine, talk to your doctor, pharmacist, or health care provider.    2016, [employer]. (2007-12-10 17:22:47)

## 2016-02-24 NOTE — Telephone Encounter (Signed)
Oral Chemotherapy Pharmacist Encounter   I spoke with patient and sister Taylor Whitney for overview of new oral chemotherapy medication: Xeloda while patient was in infusion for her 1st oxaliplatin infusion. Pt is doing well.  Planned start date today (9/28) however, medication has not yet been delivered to patient. She expects it today. She will start Xeloda this evening or tomorrow morning depending on when the medication arrives.  Counseled patient on administration, dosing, side effects, safe handling, and monitoring. Patient to take 4 tabs in the morning and 3 tabs in the evening, after a meal to decrease stomach upset.  Side effects include but not limited to: N/V/D, fatigue, and hand-foot syndrome.  Taylor Whitney voiced understanding and appreciation.   All questions answered.  Will follow up in 1-2 weeks for adherence and toxicity management.   Thank you,  Johny Drilling, PharmD, BCPS 02/24/2016  12:49 PM Oral Chemotherapy Clinic [phone]

## 2016-02-24 NOTE — Progress Notes (Signed)
Oncology Nurse Navigator Documentation  Oncology Nurse Navigator Flowsheets 02/24/2016  Navigator Location [facility]  Navigator Encounter Type Treatment  Telephone -  Abnormal Finding Date -  Confirmed Diagnosis Date -  Surgery Date -  Treatment Initiated Date 02/24/2016  Patient Visit Type MedOnc  Treatment Phase First Chemo Tx--CAPOX #1  Barriers/Navigation Needs No barriers at this time;No Questions;No Needs  Education -  Interventions None required--reports she is supposed to have delivery of her Xeloda today. She will call if she does not receive drug. Her copay is $100, which she says is manageable. Declines need to see dietician-reports being back to normal presurgery intake. Colostomy working well   Coordination of Care -  Education Method -  Support Groups/Services -  Specialty Items/DME -  Acuity Level 1  Time Spent with Patient 15

## 2016-02-24 NOTE — Progress Notes (Signed)
Pt completed 1st time oxaliplatin without any complications.  I confirmed patient has her home medication.  Education provided by Evelena Peat, PharmD, and this RN.  Pt verbalized understanding.

## 2016-02-26 ENCOUNTER — Telehealth: Payer: Self-pay

## 2016-02-26 NOTE — Telephone Encounter (Signed)
Mailed disability letter to [employer] employer and [employer] 02/18/16

## 2016-02-29 DIAGNOSIS — Z933 Colostomy status: Secondary | ICD-10-CM | POA: Diagnosis not present

## 2016-02-29 DIAGNOSIS — Z85038 Personal history of other malignant neoplasm of large intestine: Secondary | ICD-10-CM | POA: Diagnosis not present

## 2016-03-03 ENCOUNTER — Encounter: Payer: Self-pay | Admitting: Hematology

## 2016-03-03 ENCOUNTER — Other Ambulatory Visit: Payer: Self-pay | Admitting: *Deleted

## 2016-03-03 ENCOUNTER — Other Ambulatory Visit (HOSPITAL_BASED_OUTPATIENT_CLINIC_OR_DEPARTMENT_OTHER): Payer: BLUE CROSS/BLUE SHIELD

## 2016-03-03 ENCOUNTER — Ambulatory Visit (HOSPITAL_BASED_OUTPATIENT_CLINIC_OR_DEPARTMENT_OTHER): Payer: BLUE CROSS/BLUE SHIELD | Admitting: Hematology

## 2016-03-03 VITALS — BP 143/94 | HR 100 | Temp 98.7°F | Resp 17 | Ht 63.0 in | Wt 149.5 lb

## 2016-03-03 DIAGNOSIS — E876 Hypokalemia: Secondary | ICD-10-CM

## 2016-03-03 DIAGNOSIS — D509 Iron deficiency anemia, unspecified: Secondary | ICD-10-CM

## 2016-03-03 DIAGNOSIS — C186 Malignant neoplasm of descending colon: Secondary | ICD-10-CM

## 2016-03-03 DIAGNOSIS — E118 Type 2 diabetes mellitus with unspecified complications: Secondary | ICD-10-CM

## 2016-03-03 LAB — CBC WITH DIFFERENTIAL/PLATELET
BASO%: 0.5 % (ref 0.0–2.0)
Basophils Absolute: 0.1 10*3/uL (ref 0.0–0.1)
EOS ABS: 0.2 10*3/uL (ref 0.0–0.5)
EOS%: 2.1 % (ref 0.0–7.0)
HCT: 36.9 % (ref 34.8–46.6)
HGB: 11.6 g/dL (ref 11.6–15.9)
LYMPH#: 2.6 10*3/uL (ref 0.9–3.3)
LYMPH%: 23.7 % (ref 14.0–49.7)
MCH: 23.7 pg — ABNORMAL LOW (ref 25.1–34.0)
MCHC: 31.5 g/dL (ref 31.5–36.0)
MCV: 75.2 fL — ABNORMAL LOW (ref 79.5–101.0)
MONO#: 0.7 10*3/uL (ref 0.1–0.9)
MONO%: 6.9 % (ref 0.0–14.0)
NEUT#: 7.2 10*3/uL — ABNORMAL HIGH (ref 1.5–6.5)
NEUT%: 66.8 % (ref 38.4–76.8)
Platelets: 335 10*3/uL (ref 145–400)
RBC: 4.91 10*6/uL (ref 3.70–5.45)
RDW: 17.3 % — ABNORMAL HIGH (ref 11.2–14.5)
WBC: 10.8 10*3/uL — ABNORMAL HIGH (ref 3.9–10.3)

## 2016-03-03 LAB — COMPREHENSIVE METABOLIC PANEL
ALT: 12 U/L (ref 0–55)
AST: 18 U/L (ref 5–34)
Albumin: 3.8 g/dL (ref 3.5–5.0)
Alkaline Phosphatase: 78 U/L (ref 40–150)
Anion Gap: 10 mEq/L (ref 3–11)
BUN: 12.1 mg/dL (ref 7.0–26.0)
CHLORIDE: 109 meq/L (ref 98–109)
CO2: 23 meq/L (ref 22–29)
CREATININE: 0.7 mg/dL (ref 0.6–1.1)
Calcium: 9.5 mg/dL (ref 8.4–10.4)
EGFR: >90 mL/min/{1.73_m2} (ref >90)
Glucose: 114 mg/dl (ref 70–140)
Potassium: 3.7 mEq/L (ref 3.5–5.1)
Sodium: 142 mEq/L (ref 136–145)
Total Bilirubin: 0.74 mg/dL (ref 0.20–1.20)
Total Protein: 7.5 g/dL (ref 6.4–8.3)

## 2016-03-03 MED ORDER — POTASSIUM CHLORIDE CRYS ER 20 MEQ PO TBCR: 20.0000 meq | EXTENDED_RELEASE_TABLET | Freq: Every day | ORAL | 0 refills | Status: DC

## 2016-03-03 NOTE — Progress Notes (Signed)
Taylor Whitney  [phone] [phone]  Clinic Follow up Note   Patient Care Team: Donald Prose, MD as PCP - General (Family Medicine) Truitt Merle, MD as Consulting Physician (Hematology) Mickeal Skinner, MD as Consulting Physician (General Surgery) 03/03/2016   CHIEF COMPLAINTS:  Follow up stage IIIB colon cancer   Oncology History   Cancer of left colon [facility])   Staging form: Colon and Rectum, AJCC 7th Edition   - Pathologic stage from 01/18/2016: Stage IIIB (T3, N1a, cM0) - Signed by Truitt Merle, MD on 02/01/2016      Cancer of left colon ([place])   01/17/2016 Initial Diagnosis    Cancer of left colon ([place])      01/17/2016 Procedure    Flexible sigmoidoscopy showed malignant-appearing obstructing tumor in the descending colon, biopsied.      01/18/2016 Surgery    Patient underwent left hemicolectomy       01/18/2016 Pathology Results    Left hemicolectomy showed invasive adenocarcinoma, well differentiated, 4.1 cm, tumor extends into pericolonic soft tissue, lymphovascular invasion is identified, margins were negative, 1 out of 27 lymph nodes were positive.       HISTORY OF PRESENTING ILLNESS (02/01/2016):  Taylor Whitney 45 y.o. female is here because of her recently diagnosed stage IIIB colon cancer, she presents to my clinic by herself, and her two sisters were on the phone during her visit.   She presents to [facility] hospital on 01/16/2016 with nausea and vomiting for one day. She had a CT scan and flexible sigmoidoscopy which showed a near obstructing mass in the descending colon. She was seen by surgeon Dr. Kieth Brightly and underwent left colectomy and end colostomy. There were minor injury to the duodenum during her surgery, postop study showed no leakage from duodenum. She tolerated surgery and recovered well, was discharged home after 9 days of hospital stay.  She has been recovering well from surgery, has mimimum pain at incisions, her  appetite is still relatively low, her energy level has improved, she table to function and tolerate daily activities without much difficulty. She lost about 20lbs in the past few months.  She was diagnosed with DM 6 months ago, on metformin, does not check her blood glucose at home.  She was diagnosed with HL stage III in 1996, she was diagnosed and treated in [place], s/p chemo ABVD 9 months (bleomycin was held earlier due to pulmonary toxicities), no radiation. She followed with her oncologist for 5 years after treatment.   CURRENT THERAPY: adjuvant chemo CAPOX, started on 02/24/16  INTERIM HISTORY: Taylor Whitney returns for follow-up and toxicity check up after her first cycle chemotherapy. She tolerated oxaliplatin well, mild cold sensitivity, no significant nausea, diarrhea, or change of her appetite. She does have slightly worse fatigue, able to function well at home. She been tolerating Xeloda well, no noticeable side effects. No fever or chills, she lost about 5 lbs in the past two weeks   MEDICAL HISTORY:  Past Medical History:  Diagnosis Date  . Adenocarcinoma of descending colon ([place])    Flex sigmoidoscopy biopsy of descending colon mass is adenocarcinoma/notes 01/18/2016  . Cancer ([place])    Hodgkin's lymphoma  . Depression   . Family history of kidney cancer   . Hodgkin lymphoma ([place]) dx'd 01/1995  . Migraine    "maybe once/month" (01/18/2016)  . Type II diabetes mellitus ([place])     SURGICAL HISTORY: Past Surgical History:  Procedure Laterality Date  . COLON RESECTION N/A  01/18/2016   Procedure: OPEN PARTIAL COLECTOMY WITH END COLOSTOMY;  Surgeon: Mickeal Skinner, MD;  Location: [place];  Service: General;  Laterality: N/A;  . COLOSTOMY  01/18/2016  . DILATION AND CURETTAGE OF UTERUS    . FLEXIBLE SIGMOIDOSCOPY N/A 01/17/2016   Procedure: FLEXIBLE SIGMOIDOSCOPY;  Surgeon: Ladene Artist, MD;  Location[facility] ENDOSCOPY;  Service: Endoscopy;  Laterality: N/A;  . PARTIAL COLECTOMY   01/18/2016    SOCIAL HISTORY: Social History   Social History  . Marital status: Single    Spouse name: N/A  . Number of children: 0  . Years of education: N/A   Occupational History  . Professor     [employer]   Social History Main Topics  . Smoking status: Never Smoker  . Smokeless tobacco: Never Used  . Alcohol use Yes     Comment: social drinker  . Drug use: No  . Sexual activity: Not Currently   Other Topics Concern  . 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 Diabetes Mother   . Cancer Father     prostate cancer  . Cancer Paternal Uncle     renal cancer ; smoker  . Diabetes Maternal Grandmother   . Diabetes Maternal Grandfather   . Clotting disorder Paternal Grandmother   . COPD Paternal Grandfather     ALLERGIES:  is allergic to sulfa antibiotics.  MEDICATIONS:  Current Outpatient Prescriptions  Medication Sig Dispense Refill  . acetaminophen (TYLENOL) 500 MG tablet Take 1 tablet (500 mg total) by mouth every 6 (six) hours as needed. 30 tablet 0  . capecitabine (XELODA) 500 MG tablet Take on days 1-14 of chemotherapy. 98 tablet 3  . metFORMIN (GLUCOPHAGE-XR) 500 MG 24 hr tablet Take 500 mg by mouth 2 (two) times daily.  0  . ondansetron (ZOFRAN) 8 MG tablet Take 1 tablet (8 mg total) by mouth 2 (two) times daily as needed for refractory nausea / vomiting. Start on day 3 after chemotherapy. 30 tablet 1  . potassium chloride SA (K-DUR,KLOR-CON) 20 MEQ tablet Take 1 tablet (20 mEq total) by mouth daily. Take twice a day for 5 days then once a day until you see Dr. Burr Medico 30 tablet 0  . prochlorperazine (COMPAZINE) 10 MG tablet Take 1 tablet (10 mg total) by mouth every 6 (six) hours as needed (Nausea or  vomiting). 30 tablet 1  . venlafaxine XR (EFFEXOR-XR) 75 MG 24 hr capsule Take 225 mg by mouth at bedtime.  5   No current facility-administered medications for this visit.     REVIEW OF SYSTEMS:   Constitutional: Denies fevers, chills or abnormal night sweats Eyes: Denies blurriness of vision, double vision or watery eyes Ears, nose, mouth, throat, and face: Denies mucositis or sore throat Respiratory: Denies cough, dyspnea or wheezes Cardiovascular: Denies palpitation, chest discomfort or lower extremity swelling Gastrointestinal:  Denies nausea, heartburn or change in bowel habits Skin: Denies abnormal skin rashes Lymphatics: Denies new lymphadenopathy or easy bruising Neurological:Denies numbness, tingling or new weaknesses Behavioral/Psych: Mood is stable, no new changes  All other systems were reviewed with the patient and are negative.  PHYSICAL EXAMINATION: ECOG PERFORMANCE STATUS: 1 - Symptomatic but completely  ambulatory  Vitals:   03/03/16 1251  BP: (!) 143/94  Pulse: 100  Resp: 17  Temp: 98.7 F (37.1 C)   Filed Weights   03/03/16 1251  Weight: 149 lb 8 oz (67.8 kg)    GENERAL:alert, no distress and comfortable SKIN: skin color, texture, turgor are normal, no rashes or significant lesions EYES: normal, conjunctiva are pink and non-injected, sclera clear OROPHARYNX:no exudate, no erythema and lips, buccal mucosa, and tongue normal  NECK: supple, thyroid normal size, non-tender, without nodularity LYMPH:  no palpable lymphadenopathy in the cervical, axillary or inguinal LUNGS: clear to auscultation and percussion with normal breathing effort HEART: regular rate & rhythm and no murmurs and no lower extremity edema ABDOMEN:abdomen soft, non-tender and normal bowel sounds, (+) mild tenderness in the right side abdomen, midline surgical incision appears clean, without discharge or skin erythema. Musculoskeletal:no cyanosis of digits and no clubbing  PSYCH: alert &  oriented x 3 with fluent speech NEURO: no focal motor/sensory deficits  LABORATORY DATA:  I have reviewed the data as listed CBC Latest Ref Rng & Units 03/03/2016 02/24/2016 02/16/2016  WBC 3.9 - 10.3 10e3/uL 10.8(H) 8.7 9.2  Hemoglobin 11.6 - 15.9 g/dL 11.6 10.5(L) 10.1(L)  Hematocrit 34.8 - 46.6 % 36.9 33.3(L) 32.7(L)  Platelets 145 - 400 10e3/uL 335 329 263   CMP Latest Ref Rng & Units 02/24/2016 02/16/2016 02/01/2016  Glucose 70 - 140 mg/dl 157(H) 115 94  BUN 7.0 - 26.0 mg/dL 10.7 9.9 11.4  Creatinine 0.6 - 1.1 mg/dL 0.7 0.7 0.7  Sodium 136 - 145 mEq/L 139 140 142  Potassium 3.5 - 5.1 mEq/L 4.2 2.9(LL) 3.3(L)  Chloride 101 - 111 mmol/L - - -  CO2 22 - 29 mEq/L 21(L) 24 25  Calcium 8.4 - 10.4 mg/dL 9.4 9.3 9.6  Total Protein 6.4 - 8.3 g/dL 7.1 7.0 7.7  Total Bilirubin 0.20 - 1.20 mg/dL 0.69 0.84 0.53  Alkaline Phos 40 - 150 U/L 74 73 83  AST 5 - 34 U/L _0 ALT 0 - 55 U/L _1 Results for LAMYAH, CREED (MRN [id number]) as of 02/01/2016 07:28  Ref. Range 01/16/2016 14:50  CEA Latest Ref Range: 0.0 - 4.7 ng/mL 1.7   Results for Taylor Whitney, Taylor Whitney (MRN [id number]) as of 02/20/2016 16:49  Ref. Range 02/01/2016 13:48  Iron Latest Ref Range: 41 - 142 ug/dL 15 (L)  UIBC Latest Ref Range: 120 - 384 ug/dL 312  TIBC Latest Ref Range: 236 - 444 ug/dL 328  %SAT Latest Ref Range: 21 - 57 % 5 (L)  Ferritin Latest Ref Range: 9 - 269 ng/ml 22    PATHOLOGY REPORT  Diagnosis 01/18/2016 Colon, segmental resection for tumor, Left - INVASIVE ADENOCARCINOMA, WELL DIFFERENTIATED, SPANNING 4.1 CM. - ADENOCARCINOMA EXTENDS INTO PERICOLONIC SOFT TISSUE. - LYMPHOVASCULAR INVASION IS IDENTIFIED. - THE SURGICAL RESECTION MARGINS ARE NEGATIVE FOR CARCINOMA. - METASTATIC CARCINOMA IN 1 OF 27 LYMPH NODES (1/27). - DIVERTICULAR DISEASE (GROSS DIAGNOSIS). - SEE ONCOLOGY TABLE BELOW. Microscopic Comment COLON AND RECTUM (INCLUDING TRANS-ANAL RESECTION): Specimen: Left colon. Procedure:  Resection. Tumor site: Central portion of specimen. Specimen integrity: Intact. Macroscopic intactness of mesorectum: N/A Macroscopic tumor perforation: Not identified. Invasive tumor: Maximum size: 4.1 cm Histologic type(s): Adenocarcinoma Histologic grade and differentiation: G1: well differentiated/low grade. Type of polyp in which invasive carcinoma arose: Likely tubular adenoma. Microscopic extension of invasive tumor: Extends into the pericolonic soft tissue. Lymph-Vascular invasion: Present, focal Peri-neural invasion: Not identified. Tumor deposit(s) (  discontinuous extramural extension): Not identified. Resection margins: Proximal margin: 14.1 cm Distal margin: 15.8 cm Circumferential (radial) (posterior ascending, posterior descending; lateral and posterior mid-rectum; and entire lower 1/3 rectum): 3.0 cm 1 of 3 FINAL for Taylor Whitney, Taylor Whitney ([id number]) Microscopic Comment(continued) Treatment effect (neo-adjuvant therapy): N/A Additional polyp(s): Not identified. Non-neoplastic findings: Diverticular disease and splenule. Lymph nodes: number examined - 27; number positive: 1 Pathologic Staging: pT3, pN1a Ancillary studies: A block will be sent for MMR testing by IHC and a separate block will be sent for MSI testing by PCR. Additional studies can be performed upon clinician request. (JBK:gt, 01/19/16) Enid Cutter MD Pathologist, Electronic Signature (Case signed 01/19/2016)     Diagnosis 01/17/2016 Colon, biopsy, descending - POSITIVE FOR ADENOCARCINOMA. Microscopic Comment The findings are called to Dr. Fuller Plan on 01/18/16. Dr. Orene Desanctis has seen this case in consultation with agreement. (RAH;gt, 01/18/16)   RADIOGRAPHIC STUDIES: I have personally reviewed the radiological images as listed and agreed with the findings in the report. Ct Chest Wo Contrast  Result Date: 02/10/2016 CLINICAL DATA:  Colon cancer diagnosed in August 2017. Colon resection with colostomy.  EXAM: CT CHEST WITHOUT CONTRAST TECHNIQUE: Multidetector CT imaging of the chest was performed following the standard protocol without IV contrast. COMPARISON:  01/16/2016 FINDINGS: Cardiovascular: Unremarkable Mediastinum/Nodes: 4 by 5 mm calcified mediastinal lymph node anterior to the aortic arch, image 46/2. Lungs/Pleura: 2 by 3 mm ground-glass density nodule in the apical segment right upper lobe image 25/5. 3 mm ground-glass density nodule in the left upper lobe image 40/5. Focal thickening or small fluid density lesion along the left posteromedial hemidiaphragm on image 114/602 and images 102-106 of series 2. Not appreciably changed from 07/27/2006. Upper Abdomen: Unremarkable Musculoskeletal: Thoracic spondylosis. Fused left first and second ribs. IMPRESSION: 1. In both upper lobes there are single 3 mm ground-glass density nodules. No follow-up needed if patient is low-risk (and has no known or suspected primary neoplasm). Non-contrast chest CT can be considered in 12 months if patient is high-risk. This recommendation follows the consensus statement: Guidelines for Management of Incidental Pulmonary Nodules Detected on CT Images: From the Fleischner Society 2017; Radiology 2017; 284:228-243. 2. Focal thickening or small fluid density lesion along the left posteromedial hemidiaphragm, not changed from 2008, considered benign. 3. Thoracic spondylosis. 4. No characteristic findings for metastatic disease to the chest. Electronically Signed   By: Van Clines M.D.   On: 02/10/2016 11:06   Sigmoidoscopy 01/17/2016 - Malignant appearing, obstructing tumor in the descending colon. Biopsied. - Otherwise normal flex sig however limited by poor prep.  ASSESSMENT & PLAN:  45 year old with past medical history of diabetes, remote history of Hodgkin lymphoma, presented with bowel obstruction.  1. Cancer of left colon, invasive adenocarcinoma, well-differentiated, pT3N1aM0, stage IIIB, MSI-stable  -I  reviewed her surgical pathology findings with patient and her sisters in great details -Her CT of the abdomen was negative for distant metastasis -I reviewed her staging CT chest which showed no evidence of metastasis. -We discussed risk of cancer recurrence after complete surgical resection. Given her positive lymph nodes, stage IIIB disease, LVI (+), she is at high risk for recurrence.  -We discussed the standard care for stage III colon cancer is adjuvant chemotherapy. Given his young age, I recommend combined chemotherapy, FOLFOX or CAPEOX. Due to his work schedule, she opted CAPEOX. -We discussed the new data from pooled 6 phase 3 trial investigating duration of adjuvant oxaliplatin-based therapy (3 versus 6 months) for stage III colon cancer which was  reported in ASCO 2017 recently, showed similar three-year disease-free survival of 3 months versus 6 months in patients with T1-3N1 disease. Based on the new data, I recommend her to have 3 months (instead of 6 months) CAPEOX, chemo consent obtianed -She tolerated the first cycle chemotherapy well, lab reviewed with her, her CBC is unremarkable, anemia has improved, she is clinically doing well.   2. Iron deficient anemia -Her on study showed iron deficiency, probably from her previous GI bleeding from her colon cancer. -We discussed the treatment option of oral iron supplement versus IV iron. She will start ferrous sulfate 1-2 tablets a day, with vitamin C or orange juice.  If she has poor response or tolerance to oral iron, I'll consider IV iron. -Her anemia has improved, she will continue oral iron pill  3. Genetics -Her colon cancer is MSI-stable, she has no family history of colon cancer, this is unlikely Lynch syndrome -Given her young age, I'll refer her to see genetic counselor in our cancer center to ruled out other inheritable cancer syndromes.  4. DM -She was diagnosed with diabetes in earlier 2017. She is on metformin -I strongly  encouraged her to monitor her blood sugar at home -We discussed chemotherapy may impact her blood sugar level, especially premedication dexamethasone.   5. Hypokalemia -She is on potassium chloride 20 mg daily, potassium normalized last week, 3.7 today  -Continue potassium chloride daily  PLAN -She'll return on October 19 for cycle 2 chemotherapy   All questions were answered. The patient knows to call the clinic with any problems, questions or concerns. I spent 15 minutes counseling the patient face to face. The total time spent in the appointment was 20 minutes and more than 50% was on counseling.     Truitt Merle, MD 03/03/2016

## 2016-03-08 DIAGNOSIS — C801 Malignant (primary) neoplasm, unspecified: Secondary | ICD-10-CM | POA: Diagnosis not present

## 2016-03-08 DIAGNOSIS — Z8051 Family history of malignant neoplasm of kidney: Secondary | ICD-10-CM | POA: Diagnosis not present

## 2016-03-08 DIAGNOSIS — C186 Malignant neoplasm of descending colon: Secondary | ICD-10-CM | POA: Diagnosis not present

## 2016-03-13 ENCOUNTER — Telehealth: Payer: Self-pay | Admitting: Genetic Counselor

## 2016-03-13 NOTE — Telephone Encounter (Signed)
Taylor Whitney called.  The phone number listed as a patient's work number is no longer her number.

## 2016-03-16 ENCOUNTER — Encounter: Payer: Self-pay | Admitting: Hematology

## 2016-03-16 ENCOUNTER — Ambulatory Visit (HOSPITAL_BASED_OUTPATIENT_CLINIC_OR_DEPARTMENT_OTHER): Payer: BLUE CROSS/BLUE SHIELD

## 2016-03-16 ENCOUNTER — Other Ambulatory Visit (HOSPITAL_BASED_OUTPATIENT_CLINIC_OR_DEPARTMENT_OTHER): Payer: BLUE CROSS/BLUE SHIELD

## 2016-03-16 ENCOUNTER — Ambulatory Visit (HOSPITAL_BASED_OUTPATIENT_CLINIC_OR_DEPARTMENT_OTHER): Payer: BLUE CROSS/BLUE SHIELD | Admitting: Hematology

## 2016-03-16 ENCOUNTER — Telehealth: Payer: Self-pay | Admitting: Hematology

## 2016-03-16 VITALS — BP 143/87 | HR 83 | Temp 98.5°F | Resp 18 | Ht 63.0 in | Wt 155.9 lb

## 2016-03-16 DIAGNOSIS — C186 Malignant neoplasm of descending colon: Secondary | ICD-10-CM

## 2016-03-16 DIAGNOSIS — E876 Hypokalemia: Secondary | ICD-10-CM

## 2016-03-16 DIAGNOSIS — D509 Iron deficiency anemia, unspecified: Secondary | ICD-10-CM

## 2016-03-16 DIAGNOSIS — E119 Type 2 diabetes mellitus without complications: Secondary | ICD-10-CM

## 2016-03-16 DIAGNOSIS — F329 Major depressive disorder, single episode, unspecified: Secondary | ICD-10-CM

## 2016-03-16 DIAGNOSIS — F32A Depression, unspecified: Secondary | ICD-10-CM

## 2016-03-16 DIAGNOSIS — Z8579 Personal history of other malignant neoplasms of lymphoid, hematopoietic and related tissues: Secondary | ICD-10-CM

## 2016-03-16 DIAGNOSIS — Z5111 Encounter for antineoplastic chemotherapy: Secondary | ICD-10-CM

## 2016-03-16 DIAGNOSIS — D500 Iron deficiency anemia secondary to blood loss (chronic): Secondary | ICD-10-CM

## 2016-03-16 DIAGNOSIS — E118 Type 2 diabetes mellitus with unspecified complications: Secondary | ICD-10-CM

## 2016-03-16 LAB — COMPREHENSIVE METABOLIC PANEL
ALBUMIN: 3.5 g/dL (ref 3.5–5.0)
ALK PHOS: 77 U/L (ref 40–150)
ALT: 10 U/L (ref 0–55)
AST: 15 U/L (ref 5–34)
Anion Gap: 11 mEq/L (ref 3–11)
BILIRUBIN TOTAL: 0.67 mg/dL (ref 0.20–1.20)
BUN: 7.5 mg/dL (ref 7.0–26.0)
CALCIUM: 9.1 mg/dL (ref 8.4–10.4)
CO2: 23 mEq/L (ref 22–29)
CREATININE: 0.7 mg/dL (ref 0.6–1.1)
Chloride: 106 mEq/L (ref 98–109)
EGFR: >90 mL/min/{1.73_m2} (ref >90)
GLUCOSE: 154 mg/dL — AB (ref 70–140)
Potassium: 3.2 mEq/L — ABNORMAL LOW (ref 3.5–5.1)
SODIUM: 140 meq/L (ref 136–145)
TOTAL PROTEIN: 7 g/dL (ref 6.4–8.3)

## 2016-03-16 LAB — CBC WITH DIFFERENTIAL/PLATELET
BASO%: 0.5 % (ref 0.0–2.0)
Basophils Absolute: 0 10*3/uL (ref 0.0–0.1)
EOS ABS: 0.1 10*3/uL (ref 0.0–0.5)
EOS%: 1.6 % (ref 0.0–7.0)
HEMATOCRIT: 34.3 % — AB (ref 34.8–46.6)
HEMOGLOBIN: 11 g/dL — AB (ref 11.6–15.9)
LYMPH#: 2.3 10*3/uL (ref 0.9–3.3)
LYMPH%: 29 % (ref 14.0–49.7)
MCH: 25.4 pg (ref 25.1–34.0)
MCHC: 32.2 g/dL (ref 31.5–36.0)
MCV: 78.9 fL — ABNORMAL LOW (ref 79.5–101.0)
MONO#: 0.7 10*3/uL (ref 0.1–0.9)
MONO%: 9.2 % (ref 0.0–14.0)
NEUT%: 59.7 % (ref 38.4–76.8)
NEUTROS ABS: 4.7 10*3/uL (ref 1.5–6.5)
Platelets: 259 10*3/uL (ref 145–400)
RBC: 4.34 10*6/uL (ref 3.70–5.45)
RDW: 19.5 % — AB (ref 11.2–14.5)
WBC: 7.9 10*3/uL (ref 3.9–10.3)

## 2016-03-16 MED ORDER — PALONOSETRON HCL INJECTION 0.25 MG/5ML
0.2500 mg | Freq: Once | INTRAVENOUS | Status: AC
  Administered 2016-03-16: 0.25 mg via INTRAVENOUS

## 2016-03-16 MED ORDER — DEXAMETHASONE SODIUM PHOSPHATE 10 MG/ML IJ SOLN
INTRAMUSCULAR | Status: AC
  Filled 2016-03-16: qty 1

## 2016-03-16 MED ORDER — DEXTROSE 5 % IV SOLN
Freq: Once | INTRAVENOUS | Status: AC
  Administered 2016-03-16: 12:00:00 via INTRAVENOUS

## 2016-03-16 MED ORDER — SODIUM CHLORIDE 0.9 % IV SOLN: 10.0000 mg | Freq: Once | INTRAVENOUS | Status: DC

## 2016-03-16 MED ORDER — DEXAMETHASONE SODIUM PHOSPHATE 10 MG/ML IJ SOLN
10.0000 mg | Freq: Once | INTRAMUSCULAR | Status: AC
  Administered 2016-03-16: 10 mg via INTRAVENOUS

## 2016-03-16 MED ORDER — OXALIPLATIN CHEMO INJECTION 100 MG/20ML
130.0000 mg/m2 | Freq: Once | INTRAVENOUS | Status: AC
  Administered 2016-03-16: 225 mg via INTRAVENOUS
  Filled 2016-03-16: qty 5

## 2016-03-16 MED ORDER — PALONOSETRON HCL INJECTION 0.25 MG/5ML
INTRAVENOUS | Status: AC
  Filled 2016-03-16: qty 5

## 2016-03-16 MED ORDER — SODIUM CHLORIDE 0.9% FLUSH
10.0000 mL | INTRAVENOUS | Status: DC | PRN
  Filled 2016-03-16: qty 10

## 2016-03-16 NOTE — Patient Instructions (Signed)
[facility] Discharge Instructions for Patients Receiving Chemotherapy  Today you received the following chemotherapy agents:  Oxaliplatin  To help prevent nausea and vomiting after your treatment, we encourage you to take your nausea medication as prescribed.   If you develop nausea and vomiting that is not controlled by your nausea medication, call the clinic.   BELOW ARE SYMPTOMS THAT SHOULD BE REPORTED IMMEDIATELY:  *FEVER GREATER THAN 100.5 F  *CHILLS WITH OR WITHOUT FEVER  NAUSEA AND VOMITING THAT IS NOT CONTROLLED WITH YOUR NAUSEA MEDICATION  *UNUSUAL SHORTNESS OF BREATH  *UNUSUAL BRUISING OR BLEEDING  TENDERNESS IN MOUTH AND THROAT WITH OR WITHOUT PRESENCE OF ULCERS  *URINARY PROBLEMS  *BOWEL PROBLEMS  UNUSUAL RASH Items with * indicate a potential emergency and should be followed up as soon as possible.  Feel free to call the clinic you have any questions or concerns. The clinic phone number is [phone].  Please show the CHEMO ALERT CARD at check-in to the Emergency Department and triage nurse.   

## 2016-03-16 NOTE — Telephone Encounter (Signed)
Gave patient avs report and appointments for November.  °

## 2016-03-16 NOTE — Progress Notes (Signed)
Per Dr. Burr Medico, increase diluent volume for Oxaliplatin to 1L for today's treatment. Infusion time to remain the same.

## 2016-03-16 NOTE — Progress Notes (Signed)
[place]  [phone] [phone]  Clinic Follow up Note   Patient Care Team: Donald Prose, MD as PCP - General (Family Medicine) Truitt Merle, MD as Consulting Physician (Hematology) Mickeal Skinner, MD as Consulting Physician (General Surgery) 03/16/2016   CHIEF COMPLAINTS:  Follow up stage IIIB colon cancer   Oncology History   Cancer of left colon [facility])   Staging form: Colon and Rectum, AJCC 7th Edition   - Pathologic stage from 01/18/2016: Stage IIIB (T3, N1a, cM0) - Signed by Truitt Merle, MD on 02/01/2016      Cancer of left colon ([place])   01/17/2016 Initial Diagnosis    Cancer of left colon ([place])      01/17/2016 Procedure    Flexible sigmoidoscopy showed malignant-appearing obstructing tumor in the descending colon, biopsied.      01/18/2016 Surgery    Patient underwent left hemicolectomy       01/18/2016 Pathology Results    Left hemicolectomy showed invasive adenocarcinoma, well differentiated, 4.1 cm, tumor extends into pericolonic soft tissue, lymphovascular invasion is identified, margins were negative, 1 out of 27 lymph nodes were positive.       HISTORY OF PRESENTING ILLNESS (02/01/2016):  Taylor Whitney 45 y.o. female is here because of her recently diagnosed stage IIIB colon cancer, she presents to my clinic by herself, and her two sisters were on the phone during her visit.   She presents to [facility] hospital on 01/16/2016 with nausea and vomiting for one day. She had a CT scan and flexible sigmoidoscopy which showed a near obstructing mass in the descending colon. She was seen by surgeon Dr. Kieth Brightly and underwent left colectomy and end colostomy. There were minor injury to the duodenum during her surgery, postop study showed no leakage from duodenum. She tolerated surgery and recovered well, was discharged home after 9 days of hospital stay.  She has been recovering well from surgery, has mimimum pain at incisions, her  appetite is still relatively low, her energy level has improved, she table to function and tolerate daily activities without much difficulty. She lost about 20lbs in the past few months.  She was diagnosed with DM 6 months ago, on metformin, does not check her blood glucose at home.  She was diagnosed with HL stage III in 1996, she was diagnosed and treated in [place], s/p chemo ABVD 9 months (bleomycin was held earlier due to pulmonary toxicities), no radiation. She followed with her oncologist for 5 years after treatment.   CURRENT THERAPY: adjuvant chemo CAPOX, started on 02/24/16  INTERIM HISTORY: Taylor Whitney returns for follow-up and second cycle chemotherapy. She is accompanied by her parents, who came from [place] to be with her for a week. She is doing well overall, her appetite and energy level much improved last week when she was off chemotherapy. She has gained her weight back. No fever or chills, no nausea, diarrhea, or other complaints. She has mild cold sensitivity, no significant peripheral neuropathy.  MEDICAL HISTORY:  Past Medical History:  Diagnosis Date  . Adenocarcinoma of descending colon ([place])    Flex sigmoidoscopy biopsy of descending colon mass is adenocarcinoma/notes 01/18/2016  . Cancer ([place])    Hodgkin's lymphoma  . Depression   . Family history of kidney cancer   . Hodgkin lymphoma ([place]) dx'd 01/1995  . Migraine    "maybe once/month" (01/18/2016)  . Type II diabetes mellitus ([place])     SURGICAL HISTORY: Past Surgical History:  Procedure Laterality Date  .  COLON RESECTION N/A 01/18/2016   Procedure: OPEN PARTIAL COLECTOMY WITH END COLOSTOMY;  Surgeon: Mickeal Skinner, MD;  Location: [place];  Service: General;  Laterality: N/A;  . COLOSTOMY  01/18/2016  . DILATION AND CURETTAGE OF UTERUS    . FLEXIBLE SIGMOIDOSCOPY N/A 01/17/2016   Procedure: FLEXIBLE SIGMOIDOSCOPY;  Surgeon: Ladene Artist, MD;  Location: [facility] ENDOSCOPY;  Service: Endoscopy;  Laterality:  N/A;  . PARTIAL COLECTOMY  01/18/2016    SOCIAL HISTORY: Social History   Social History  . Marital status: Single    Spouse name: N/A  . Number of children: 0  . Years of education: N/A   Occupational History  . Professor     [employer]   Social History Main Topics  . Smoking status: Never Smoker  . Smokeless tobacco: Never Used  . Alcohol use Yes     Comment: social drinker  . Drug use: No  . Sexual activity: Not Currently   Other Topics Concern  . 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 Diabetes Mother   . Cancer Father     prostate cancer  . Cancer Paternal Uncle     renal cancer ; smoker  . Diabetes Maternal Grandmother   . Diabetes Maternal Grandfather   . Clotting disorder Paternal Grandmother   . COPD Paternal Grandfather     ALLERGIES:  is allergic to sulfa antibiotics.  MEDICATIONS:  Current Outpatient Prescriptions  Medication Sig Dispense Refill  . acetaminophen (TYLENOL) 500 MG tablet Take 1 tablet (500 mg total) by mouth every 6 (six) hours as needed. 30 tablet 0  . capecitabine (XELODA) 500 MG tablet Take on days 1-14 of chemotherapy. 98 tablet 3  . metFORMIN (GLUCOPHAGE-XR) 500 MG 24 hr tablet Take 500 mg by mouth 2 (two) times daily.  0  . ondansetron (ZOFRAN) 8 MG tablet Take 1 tablet (8 mg total) by mouth 2 (two) times daily as needed for refractory nausea / vomiting. Start on day 3 after chemotherapy. 30 tablet 1  . potassium chloride SA (K-DUR,KLOR-CON) 20 MEQ tablet Take 1 tablet (20 mEq total) by mouth daily. 30 tablet 0  . prochlorperazine (COMPAZINE) 10 MG tablet Take 1 tablet (10 mg total) by mouth every 6 (six) hours as needed (Nausea or vomiting). 30 tablet 1  . venlafaxine XR  (EFFEXOR-XR) 75 MG 24 hr capsule Take 225 mg by mouth at bedtime.  5   No current facility-administered medications for this visit.     REVIEW OF SYSTEMS:   Constitutional: Denies fevers, chills or abnormal night sweats Eyes: Denies blurriness of vision, double vision or watery eyes Ears, nose, mouth, throat, and face: Denies mucositis or sore throat Respiratory: Denies cough, dyspnea or wheezes Cardiovascular: Denies palpitation, chest discomfort or lower extremity swelling Gastrointestinal:  Denies nausea, heartburn or change in bowel habits Skin: Denies abnormal skin rashes Lymphatics: Denies new lymphadenopathy or easy bruising Neurological:Denies numbness, tingling or new weaknesses Behavioral/Psych: Mood is stable, no new changes  All other systems were reviewed with the patient and are negative.  PHYSICAL EXAMINATION: ECOG PERFORMANCE STATUS: 1 - Symptomatic but completely ambulatory  Vitals:   03/16/16 1027  BP: (!) 143/87  Pulse:  83  Resp: 18  Temp: 98.5 F (36.9 C)   Filed Weights   03/16/16 1027  Weight: 155 lb 14.4 oz (70.7 kg)    GENERAL:alert, no distress and comfortable SKIN: skin color, texture, turgor are normal, no rashes or significant lesions EYES: normal, conjunctiva are pink and non-injected, sclera clear OROPHARYNX:no exudate, no erythema and lips, buccal mucosa, and tongue normal  NECK: supple, thyroid normal size, non-tender, without nodularity LYMPH:  no palpable lymphadenopathy in the cervical, axillary or inguinal LUNGS: clear to auscultation and percussion with normal breathing effort HEART: regular rate & rhythm and no murmurs and no lower extremity edema ABDOMEN:abdomen soft, non-tender and normal bowel sounds, (+) mild tenderness in the right side abdomen, midline surgical incision appears clean, without discharge or skin erythema. Musculoskeletal:no cyanosis of digits and no clubbing  PSYCH: alert & oriented x 3 with fluent speech NEURO:  no focal motor/sensory deficits  LABORATORY DATA:  I have reviewed the data as listed CBC Latest Ref Rng & Units 03/16/2016 03/03/2016 02/24/2016  WBC 3.9 - 10.3 10e3/uL 7.9 10.8(H) 8.7  Hemoglobin 11.6 - 15.9 g/dL 11.0(L) 11.6 10.5(L)  Hematocrit 34.8 - 46.6 % 34.3(L) 36.9 33.3(L)  Platelets 145 - 400 10e3/uL 259 335 329   CMP Latest Ref Rng & Units 03/16/2016 03/03/2016 02/24/2016  Glucose 70 - 140 mg/dl 154(H) 114 157(H)  BUN 7.0 - 26.0 mg/dL 7.5 12.1 10.7  Creatinine 0.6 - 1.1 mg/dL 0.7 0.7 0.7  Sodium 136 - 145 mEq/L 140 142 139  Potassium 3.5 - 5.1 mEq/L 3.2(L) 3.7 4.2  Chloride 101 - 111 mmol/L - - -  CO2 22 - 29 mEq/L 23 23 21(L)  Calcium 8.4 - 10.4 mg/dL 9.1 9.5 9.4  Total Protein 6.4 - 8.3 g/dL 7.0 7.5 7.1  Total Bilirubin 0.20 - 1.20 mg/dL 0.67 0.74 0.69  Alkaline Phos 40 - 150 U/L 77 78 74  AST 5 - 34 U/L '15 18 10  ' ALT 0 - 55 U/L '10 12 11   ' Results for Taylor Whitney, Taylor Whitney (MRN [id number]) as of 02/01/2016 07:28  Ref. Range 01/16/2016 14:50  CEA Latest Ref Range: 0.0 - 4.7 ng/mL 1.7   Results for Taylor Whitney, Taylor Whitney (MRN [id number]) as of 02/20/2016 16:49  Ref. Range 02/01/2016 13:48  Iron Latest Ref Range: 41 - 142 ug/dL 15 (L)  UIBC Latest Ref Range: 120 - 384 ug/dL 312  TIBC Latest Ref Range: 236 - 444 ug/dL 328  %SAT Latest Ref Range: 21 - 57 % 5 (L)  Ferritin Latest Ref Range: 9 - 269 ng/ml 22    PATHOLOGY REPORT  Diagnosis 01/18/2016 Colon, segmental resection for tumor, Left - INVASIVE ADENOCARCINOMA, WELL DIFFERENTIATED, SPANNING 4.1 CM. - ADENOCARCINOMA EXTENDS INTO PERICOLONIC SOFT TISSUE. - LYMPHOVASCULAR INVASION IS IDENTIFIED. - THE SURGICAL RESECTION MARGINS ARE NEGATIVE FOR CARCINOMA. - METASTATIC CARCINOMA IN 1 OF 27 LYMPH NODES (1/27). - DIVERTICULAR DISEASE (GROSS DIAGNOSIS). - SEE ONCOLOGY TABLE BELOW. Microscopic Comment COLON AND RECTUM (INCLUDING TRANS-ANAL RESECTION): Specimen: Left colon. Procedure: Resection. Tumor site: Central portion  of specimen. Specimen integrity: Intact. Macroscopic intactness of mesorectum: N/A Macroscopic tumor perforation: Not identified. Invasive tumor: Maximum size: 4.1 cm Histologic type(s): Adenocarcinoma Histologic grade and differentiation: G1: well differentiated/low grade. Type of polyp in which invasive carcinoma arose: Likely tubular adenoma. Microscopic extension of invasive tumor: Extends into the pericolonic soft tissue. Lymph-Vascular invasion: Present, focal Peri-neural invasion: Not identified. Tumor deposit(s) (discontinuous extramural extension): Not identified. Resection margins: Proximal margin: 14.1 cm Distal margin:  15.8 cm Circumferential (radial) (posterior ascending, posterior descending; lateral and posterior mid-rectum; and entire lower 1/3 rectum): 3.0 cm 1 of 3 FINAL for Taylor Whitney, Taylor Whitney ([id number]) Microscopic Comment(continued) Treatment effect (neo-adjuvant therapy): N/A Additional polyp(s): Not identified. Non-neoplastic findings: Diverticular disease and splenule. Lymph nodes: number examined - 27; number positive: 1 Pathologic Staging: pT3, pN1a Ancillary studies: A block will be sent for MMR testing by IHC and a separate block will be sent for MSI testing by PCR. Additional studies can be performed upon clinician request. (JBK:gt, 01/19/16) Enid Cutter MD Pathologist, Electronic Signature (Case signed 01/19/2016)     Diagnosis 01/17/2016 Colon, biopsy, descending - POSITIVE FOR ADENOCARCINOMA. Microscopic Comment The findings are called to Dr. Fuller Plan on 01/18/16. Dr. Orene Desanctis has seen this case in consultation with agreement. (RAH;gt, 01/18/16)   RADIOGRAPHIC STUDIES: I have personally reviewed the radiological images as listed and agreed with the findings in the report. No results found. Sigmoidoscopy 01/17/2016 - Malignant appearing, obstructing tumor in the descending colon. Biopsied. - Otherwise normal flex sig however limited by poor  prep.  ASSESSMENT & PLAN:  45 year old with past medical history of diabetes, remote history of Hodgkin lymphoma, presented with bowel obstruction.  1. Cancer of left colon, invasive adenocarcinoma, well-differentiated, pT3N1aM0, stage IIIB, MSI-stable  -I reviewed her surgical pathology findings with patient and her sisters in great details -Her CT of the abdomen was negative for distant metastasis -I reviewed her staging CT chest which showed no evidence of metastasis. -We discussed risk of cancer recurrence after complete surgical resection. Given her positive lymph nodes, stage IIIB disease, LVI (+), she is at high risk for recurrence.  -We discussed the standard care for stage III colon cancer is adjuvant chemotherapy. Given his young age, I recommend combined chemotherapy, FOLFOX or CAPEOX. Due to his work schedule, she opted CAPEOX. -We discussed the new data from pooled 6 phase 3 trial investigating duration of adjuvant oxaliplatin-based therapy (3 versus 6 months) for stage III colon cancer which was reported in ASCO 2017 recently, showed similar three-year disease-free survival of 3 months versus 6 months in patients with T1-3N1 disease. Based on the new data, I recommend her to have 3 months (instead of 6 months) CAPEOX, chemo consent obtianed -She tolerated the first cycle chemotherapy well, lab reviewed with her, her CBC is unremarkable, anemia has improved, she is clinically doing well, we'll proceed second cycle CAPOX today    2. Iron deficient anemia -Her on study showed iron deficiency, probably from her previous GI bleeding from her colon cancer. -We discussed the treatment option of oral iron supplement versus IV iron. She will start ferrous sulfate 1-2 tablets a day, with vitamin C or orange juice.  If she has poor response or tolerance to oral iron, I'll consider IV iron. -Her anemia has improved, she will continue oral iron pill  3. Genetics -Her colon cancer is MSI-stable,  she has no family history of colon cancer, this is unlikely Lynch syndrome -Given her young age, I'll refer her to see genetic counselor in our cancer center to ruled out other inheritable cancer syndromes, her GI testing results still pending.  4. DM -She was diagnosed with diabetes in earlier 2017. She is on metformin -I strongly encouraged her to monitor her blood sugar at home -We discussed chemotherapy may impact her blood sugar level, especially premedication dexamethasone.   5. Hypokalemia -She is on potassium chloride 20 mg daily, potassium still low, 3.2 today  -Continue potassium chloride, increase to twice  daily, then once daily  PLAN -lab reviewed, we will start second cycle  Xeloda and Oxaliplatin today -She'll return in 2 weeks for next cycle CAPOX    All questions were answered. The patient knows to call the clinic with any problems, questions or concerns. I spent 20 minutes counseling the patient face to face. The total time spent in the appointment was 25 minutes and more than 50% was on counseling.     Truitt Merle, MD 03/16/2016

## 2016-03-24 DIAGNOSIS — Z85038 Personal history of other malignant neoplasm of large intestine: Secondary | ICD-10-CM | POA: Diagnosis not present

## 2016-03-24 DIAGNOSIS — Z933 Colostomy status: Secondary | ICD-10-CM | POA: Diagnosis not present

## 2016-03-28 ENCOUNTER — Telehealth: Payer: Self-pay | Admitting: Genetic Counselor

## 2016-03-28 ENCOUNTER — Encounter: Payer: Self-pay | Admitting: Genetic Counselor

## 2016-03-28 DIAGNOSIS — Z1379 Encounter for other screening for genetic and chromosomal anomalies: Secondary | ICD-10-CM | POA: Insufficient documentation

## 2016-03-28 NOTE — Telephone Encounter (Signed)
LM on VM that we had test results and to please call back.  Left CB instructions.

## 2016-03-30 ENCOUNTER — Telehealth: Payer: Self-pay | Admitting: Genetic Counselor

## 2016-03-30 ENCOUNTER — Encounter: Payer: Self-pay | Admitting: Genetic Counselor

## 2016-03-30 DIAGNOSIS — Z85038 Personal history of other malignant neoplasm of large intestine: Secondary | ICD-10-CM | POA: Diagnosis not present

## 2016-03-30 DIAGNOSIS — Z933 Colostomy status: Secondary | ICD-10-CM | POA: Diagnosis not present

## 2016-03-30 NOTE — Telephone Encounter (Signed)
LM on VM that results are back and to please CB so we can discuss.  Also indicated that we were struggling getting ahold of her and that I was going to reach out to her sister who is listed as an emergency contact in order to try to get ahold of her.

## 2016-03-31 ENCOUNTER — Ambulatory Visit (AMBULATORY_SURGERY_CENTER): Payer: Self-pay

## 2016-03-31 VITALS — Ht 63.0 in | Wt 154.6 lb

## 2016-03-31 DIAGNOSIS — C186 Malignant neoplasm of descending colon: Secondary | ICD-10-CM

## 2016-03-31 MED ORDER — NA SULFATE-K SULFATE-MG SULF 17.5-3.13-1.6 GM/177ML PO SOLN: ORAL | 0 refills | Status: DC

## 2016-03-31 NOTE — Progress Notes (Signed)
Per pt, no allergies to soy or egg products.Pt not taking any weight loss meds or using  O2 at home.   Left message for pt to hold Ferrous Sulfate for 5 days prior to the procedure.

## 2016-04-06 ENCOUNTER — Ambulatory Visit (HOSPITAL_BASED_OUTPATIENT_CLINIC_OR_DEPARTMENT_OTHER): Payer: BLUE CROSS/BLUE SHIELD | Admitting: Hematology

## 2016-04-06 ENCOUNTER — Ambulatory Visit (HOSPITAL_BASED_OUTPATIENT_CLINIC_OR_DEPARTMENT_OTHER): Payer: BLUE CROSS/BLUE SHIELD

## 2016-04-06 ENCOUNTER — Other Ambulatory Visit (HOSPITAL_BASED_OUTPATIENT_CLINIC_OR_DEPARTMENT_OTHER): Payer: BLUE CROSS/BLUE SHIELD

## 2016-04-06 VITALS — BP 135/82 | HR 88 | Temp 98.7°F | Resp 18 | Ht 63.0 in | Wt 157.7 lb

## 2016-04-06 DIAGNOSIS — D509 Iron deficiency anemia, unspecified: Secondary | ICD-10-CM | POA: Diagnosis not present

## 2016-04-06 DIAGNOSIS — C186 Malignant neoplasm of descending colon: Secondary | ICD-10-CM

## 2016-04-06 DIAGNOSIS — E876 Hypokalemia: Secondary | ICD-10-CM

## 2016-04-06 DIAGNOSIS — F32A Depression, unspecified: Secondary | ICD-10-CM

## 2016-04-06 DIAGNOSIS — E118 Type 2 diabetes mellitus with unspecified complications: Secondary | ICD-10-CM

## 2016-04-06 DIAGNOSIS — F329 Major depressive disorder, single episode, unspecified: Secondary | ICD-10-CM

## 2016-04-06 DIAGNOSIS — D500 Iron deficiency anemia secondary to blood loss (chronic): Secondary | ICD-10-CM

## 2016-04-06 DIAGNOSIS — Z5111 Encounter for antineoplastic chemotherapy: Secondary | ICD-10-CM | POA: Diagnosis not present

## 2016-04-06 LAB — COMPREHENSIVE METABOLIC PANEL
ALBUMIN: 3.4 g/dL — AB (ref 3.5–5.0)
ALK PHOS: 89 U/L (ref 40–150)
ALT: 8 U/L (ref 0–55)
AST: 15 U/L (ref 5–34)
Anion Gap: 12 mEq/L — ABNORMAL HIGH (ref 3–11)
BILIRUBIN TOTAL: 0.81 mg/dL (ref 0.20–1.20)
BUN: 12.3 mg/dL (ref 7.0–26.0)
CALCIUM: 9 mg/dL (ref 8.4–10.4)
CO2: 22 mEq/L (ref 22–29)
Chloride: 106 mEq/L (ref 98–109)
Creatinine: 0.7 mg/dL (ref 0.6–1.1)
GLUCOSE: 157 mg/dL — AB (ref 70–140)
Potassium: 3.2 mEq/L — ABNORMAL LOW (ref 3.5–5.1)
SODIUM: 141 meq/L (ref 136–145)
TOTAL PROTEIN: 7.3 g/dL (ref 6.4–8.3)

## 2016-04-06 LAB — CBC WITH DIFFERENTIAL/PLATELET
BASO%: 0.6 % (ref 0.0–2.0)
BASOS ABS: 0 10*3/uL (ref 0.0–0.1)
EOS ABS: 0.1 10*3/uL (ref 0.0–0.5)
EOS%: 1.9 % (ref 0.0–7.0)
HEMATOCRIT: 33.6 % — AB (ref 34.8–46.6)
HEMOGLOBIN: 11 g/dL — AB (ref 11.6–15.9)
LYMPH#: 2.2 10*3/uL (ref 0.9–3.3)
LYMPH%: 32.5 % (ref 14.0–49.7)
MCH: 26.7 pg (ref 25.1–34.0)
MCHC: 32.7 g/dL (ref 31.5–36.0)
MCV: 81.6 fL (ref 79.5–101.0)
MONO#: 0.7 10*3/uL (ref 0.1–0.9)
MONO%: 10.2 % (ref 0.0–14.0)
NEUT%: 54.8 % (ref 38.4–76.8)
NEUTROS ABS: 3.7 10*3/uL (ref 1.5–6.5)
Platelets: 199 10*3/uL (ref 145–400)
RBC: 4.11 10*6/uL (ref 3.70–5.45)
RDW: 28.5 % — AB (ref 11.2–14.5)
WBC: 6.7 10*3/uL (ref 3.9–10.3)

## 2016-04-06 MED ORDER — DEXTROSE 5 % IV SOLN
130.0000 mg/m2 | Freq: Once | INTRAVENOUS | Status: AC
  Administered 2016-04-06: 225 mg via INTRAVENOUS
  Filled 2016-04-06: qty 40

## 2016-04-06 MED ORDER — DEXTROSE 5 % IV SOLN
Freq: Once | INTRAVENOUS | Status: AC
  Administered 2016-04-06: 14:00:00 via INTRAVENOUS

## 2016-04-06 MED ORDER — DEXAMETHASONE SODIUM PHOSPHATE 10 MG/ML IJ SOLN
INTRAMUSCULAR | Status: AC
  Filled 2016-04-06: qty 1

## 2016-04-06 MED ORDER — DEXAMETHASONE SODIUM PHOSPHATE 10 MG/ML IJ SOLN
10.0000 mg | Freq: Once | INTRAMUSCULAR | Status: AC
  Administered 2016-04-06: 10 mg via INTRAVENOUS

## 2016-04-06 MED ORDER — PALONOSETRON HCL INJECTION 0.25 MG/5ML
0.2500 mg | Freq: Once | INTRAVENOUS | Status: AC
  Administered 2016-04-06: 0.25 mg via INTRAVENOUS

## 2016-04-06 MED ORDER — PALONOSETRON HCL INJECTION 0.25 MG/5ML
INTRAVENOUS | Status: AC
  Filled 2016-04-06: qty 5

## 2016-04-06 NOTE — Patient Instructions (Signed)
[facility] Discharge Instructions for Patients Receiving Chemotherapy  Today you received the following chemotherapy agents Oxaliplatin  To help prevent nausea and vomiting after your treatment, we encourage you to take your nausea medication as directed.   If you develop nausea and vomiting that is not controlled by your nausea medication, call the clinic.   BELOW ARE SYMPTOMS THAT SHOULD BE REPORTED IMMEDIATELY:  *FEVER GREATER THAN 100.5 F  *CHILLS WITH OR WITHOUT FEVER  NAUSEA AND VOMITING THAT IS NOT CONTROLLED WITH YOUR NAUSEA MEDICATION  *UNUSUAL SHORTNESS OF BREATH  *UNUSUAL BRUISING OR BLEEDING  TENDERNESS IN MOUTH AND THROAT WITH OR WITHOUT PRESENCE OF ULCERS  *URINARY PROBLEMS  *BOWEL PROBLEMS  UNUSUAL RASH Items with * indicate a potential emergency and should be followed up as soon as possible.  Feel free to call the clinic you have any questions or concerns. The clinic phone number is [phone].  Please show the CHEMO ALERT CARD at check-in to the Emergency Department and triage nurse.    

## 2016-04-06 NOTE — Progress Notes (Signed)
Taylor Whitney  [phone] [phone]  Clinic Follow up Note   Patient Care Team: Donald Prose, MD as PCP - General (Family Medicine) Truitt Merle, MD as Consulting Physician (Hematology) Mickeal Skinner, MD as Consulting Physician (General Surgery) 04/06/2016   CHIEF COMPLAINTS:  Follow up stage IIIB colon cancer   Oncology History   Cancer of left colon [facility])   Staging form: Colon and Rectum, AJCC 7th Edition   - Pathologic stage from 01/18/2016: Stage IIIB (T3, N1a, cM0) - Signed by Truitt Merle, MD on 02/01/2016      Cancer of left colon ([place])   01/17/2016 Initial Diagnosis    Cancer of left colon ([place])      01/17/2016 Procedure    Flexible sigmoidoscopy showed malignant-appearing obstructing tumor in the descending colon, biopsied.      01/18/2016 Surgery    Patient underwent left hemicolectomy       01/18/2016 Pathology Results    Left hemicolectomy showed invasive adenocarcinoma, well differentiated, 4.1 cm, tumor extends into pericolonic soft tissue, lymphovascular invasion is identified, margins were negative, 1 out of 27 lymph nodes were positive.       HISTORY OF PRESENTING ILLNESS (02/01/2016):  Taylor Whitney 45 y.o. female is here because of her recently diagnosed stage IIIB colon cancer, she presents to my clinic by herself, and her two sisters were on the phone during her visit.   She presents to [facility] hospital on 01/16/2016 with nausea and vomiting for one day. She had a CT scan and flexible sigmoidoscopy which showed a near obstructing mass in the descending colon. She was seen by surgeon Dr. Kieth Brightly and underwent left colectomy and end colostomy. There were minor injury to the duodenum during her surgery, postop study showed no leakage from duodenum. She tolerated surgery and recovered well, was discharged home after 9 days of hospital stay.  She has been recovering well from surgery, has mimimum pain at incisions, her  appetite is still relatively low, her energy level has improved, she table to function and tolerate daily activities without much difficulty. She lost about 20lbs in the past few months.  She was diagnosed with DM 6 months ago, on metformin, does not check her blood glucose at home.  She was diagnosed with HL stage III in 1996, she was diagnosed and treated in [place], s/p chemo ABVD 9 months (bleomycin was held earlier due to pulmonary toxicities), no radiation. She followed with her oncologist for 5 years after treatment.   CURRENT THERAPY: adjuvant chemo CAPOX, started on 02/24/16  INTERIM HISTORY: Taylor Whitney returns for follow-up and third cycle chemotherapy. She is accompanied by her sister to the clinic today. She tolerated the last cycle oxaliplatin infusion well, but without sluggish after she completed infusion, could not move her body as fast as she used to do. The weird feeding resolved in a few minutes. No rash, dyspnea, or tenderness in throat. She tolerated oxaliplatin very well, no significant nausea, diarrhea or other symptoms. Her appetite and energy level recovered well overall, no other new complaints.Marland Kitchen  MEDICAL HISTORY:  Past Medical History:  Diagnosis Date  . Adenocarcinoma of descending colon ([place]) 12/2015   Flex sigmoidoscopy biopsy of descending colon mass is adenocarcinoma/notes 01/18/2016  . Cancer ([place])    Hodgkin's lymphoma  . Depression   . Family history of kidney cancer   . Hodgkin lymphoma ([place]) dx'd 01/1995  . Migraine    "maybe once/month" (01/18/2016)  . Type II diabetes mellitus (  [place]) 06/2015    SURGICAL HISTORY: Past Surgical History:  Procedure Laterality Date  . COLON RESECTION N/A 01/18/2016   Procedure: OPEN PARTIAL COLECTOMY WITH END COLOSTOMY;  Surgeon: Mickeal Skinner, MD;  Location: [place];  Service: General;  Laterality: N/A;  . COLOSTOMY  01/18/2016  . DILATION AND CURETTAGE OF UTERUS    . FLEXIBLE SIGMOIDOSCOPY N/A 01/17/2016    Procedure: FLEXIBLE SIGMOIDOSCOPY;  Surgeon: Ladene Artist, MD;  Location: [facility] ENDOSCOPY;  Service: Endoscopy;  Laterality: N/A;  . LYMPH NODE BIOPSY  1996  . MANDIBLE SURGERY  1988  . PARTIAL COLECTOMY  01/18/2016    SOCIAL HISTORY: Social History   Social History  . Marital status: Single    Spouse name: N/A  . Number of children: 0  . Years of education: N/A   Occupational History  . Professor     [employer]   Social History Main Topics  . Smoking status: Never Smoker  . Smokeless tobacco: Never Used  . Alcohol use No  . Drug use: No  . Sexual activity: Not Currently   Other Topics Concern  . 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 Diabetes Mother   . Cancer Father     prostate cancer  . Cancer Paternal Uncle     renal cancer ; smoker  . Diabetes Maternal Grandmother   . Diabetes Maternal Grandfather   . Clotting disorder Paternal Grandmother   . COPD Paternal Grandfather     ALLERGIES:  is allergic to sulfa antibiotics.  MEDICATIONS:  Current Outpatient Prescriptions  Medication Sig Dispense Refill  . acetaminophen (TYLENOL) 500 MG tablet Take 1 tablet (500 mg total) by mouth every 6 (six) hours as needed. 30 tablet 0  . capecitabine (XELODA) 500 MG tablet Take on days 1-14 of chemotherapy. (Patient taking differently: Take on days 1-14 of chemotherapy.Take 2000 mg in am and 1500 mg in pm for 14 days.) 98 tablet 3  . metFORMIN (GLUCOPHAGE-XR) 500 MG 24 hr tablet Take 500 mg by mouth 2 (two) times daily.  0  . Na Sulfate-K Sulfate-Mg Sulf (SUPREP BOWEL PREP KIT) 17.5-3.13-1.6 GM/180ML SOLN Suprep as directed / no substitutions 354 mL 0  . ondansetron (ZOFRAN) 8 MG tablet Take 1 tablet (8 mg total) by mouth 2  (two) times daily as needed for refractory nausea / vomiting. Start on day 3 after chemotherapy. 30 tablet 1  . potassium chloride SA (K-DUR,KLOR-CON) 20 MEQ tablet Take 1 tablet (20 mEq total) by mouth daily. 30 tablet 0  . prochlorperazine (COMPAZINE) 10 MG tablet Take 1 tablet (10 mg total) by mouth every 6 (six) hours as needed (Nausea or vomiting). 30 tablet 1  . venlafaxine XR (EFFEXOR-XR) 75 MG 24 hr capsule Take 225 mg by mouth at bedtime.  5   No current facility-administered medications for this visit.     REVIEW OF SYSTEMS:   Constitutional: Denies fevers, chills or abnormal night sweats Eyes: Denies blurriness of vision, double vision or watery eyes Ears, nose, mouth, throat, and face: Denies mucositis or sore throat Respiratory: Denies cough, dyspnea or wheezes Cardiovascular: Denies palpitation, chest discomfort or lower extremity swelling Gastrointestinal:  Denies nausea, heartburn or  change in bowel habits Skin: Denies abnormal skin rashes Lymphatics: Denies new lymphadenopathy or easy bruising Neurological:Denies numbness, tingling or new weaknesses Behavioral/Psych: Mood is stable, no new changes  All other systems were reviewed with the patient and are negative.  PHYSICAL EXAMINATION: ECOG PERFORMANCE STATUS: 1 - Symptomatic but completely ambulatory  Vitals:   04/06/16 1310  BP: 135/82  Pulse: 88  Resp: 18  Temp: 98.7 F (37.1 C)   Filed Weights   04/06/16 1310  Weight: 157 lb 11.2 oz (71.5 kg)    GENERAL:alert, no distress and comfortable SKIN: skin color, texture, turgor are normal, no rashes or significant lesions EYES: normal, conjunctiva are pink and non-injected, sclera clear OROPHARYNX:no exudate, no erythema and lips, buccal mucosa, and tongue normal  NECK: supple, thyroid normal size, non-tender, without nodularity LYMPH:  no palpable lymphadenopathy in the cervical, axillary or inguinal LUNGS: clear to auscultation and percussion with normal  breathing effort HEART: regular rate & rhythm and no murmurs and no lower extremity edema ABDOMEN:abdomen soft, non-tender and normal bowel sounds, Surgical scar has well-healed, no tenderness  Musculoskeletal:no cyanosis of digits and no clubbing  PSYCH: alert & oriented x 3 with fluent speech NEURO: no focal motor/sensory deficits  LABORATORY DATA:  I have reviewed the data as listed CBC Latest Ref Rng & Units 04/06/2016 03/16/2016 03/03/2016  WBC 3.9 - 10.3 10e3/uL 6.7 7.9 10.8(H)  Hemoglobin 11.6 - 15.9 g/dL 11.0(L) 11.0(L) 11.6  Hematocrit 34.8 - 46.6 % 33.6(L) 34.3(L) 36.9  Platelets 145 - 400 10e3/uL 199 259 335   CMP Latest Ref Rng & Units 04/06/2016 03/16/2016 03/03/2016  Glucose 70 - 140 mg/dl 157(H) 154(H) 114  BUN 7.0 - 26.0 mg/dL 12.3 7.5 12.1  Creatinine 0.6 - 1.1 mg/dL 0.7 0.7 0.7  Sodium 136 - 145 mEq/L 141 140 142  Potassium 3.5 - 5.1 mEq/L 3.2(L) 3.2(L) 3.7  Chloride 101 - 111 mmol/L - - -  CO2 22 - 29 mEq/L _0 Calcium 8.4 - 10.4 mg/dL 9.0 9.1 9.5  Total Protein 6.4 - 8.3 g/dL 7.3 7.0 7.5  Total Bilirubin 0.20 - 1.20 mg/dL 0.81 0.67 0.74  Alkaline Phos 40 - 150 U/L 89 77 78  AST 5 - 34 U/L _1 ALT 0 - 55 U/L _2 Results for Taylor Whitney, Taylor Whitney (MRN [id number]) as of 02/01/2016 07:28  Ref. Range 01/16/2016 14:50  CEA Latest Ref Range: 0.0 - 4.7 ng/mL 1.7   Results for Taylor Whitney, Taylor Whitney (MRN [id number]) as of 02/20/2016 16:49  Ref. Range 02/01/2016 13:48  Iron Latest Ref Range: 41 - 142 ug/dL 15 (L)  UIBC Latest Ref Range: 120 - 384 ug/dL 312  TIBC Latest Ref Range: 236 - 444 ug/dL 328  %SAT Latest Ref Range: 21 - 57 % 5 (L)  Ferritin Latest Ref Range: 9 - 269 ng/ml 22    PATHOLOGY REPORT  Diagnosis 01/18/2016 Colon, segmental resection for tumor, Left - INVASIVE ADENOCARCINOMA, WELL DIFFERENTIATED, SPANNING 4.1 CM. - ADENOCARCINOMA EXTENDS INTO PERICOLONIC SOFT TISSUE. - LYMPHOVASCULAR INVASION IS IDENTIFIED. - THE SURGICAL RESECTION  MARGINS ARE NEGATIVE FOR CARCINOMA. - METASTATIC CARCINOMA IN 1 OF 27 LYMPH NODES (1/27). - DIVERTICULAR DISEASE (GROSS DIAGNOSIS). - SEE ONCOLOGY TABLE BELOW. Microscopic Comment COLON AND RECTUM (INCLUDING TRANS-ANAL RESECTION): Specimen: Left colon. Procedure: Resection. Tumor site: Central portion of specimen. Specimen integrity: Intact. Macroscopic intactness of mesorectum: N/A Macroscopic tumor perforation: Not identified. Invasive tumor: Maximum size: 4.1 cm Histologic type(s): Adenocarcinoma  Histologic grade and differentiation: G1: well differentiated/low grade. Type of polyp in which invasive carcinoma arose: Likely tubular adenoma. Microscopic extension of invasive tumor: Extends into the pericolonic soft tissue. Lymph-Vascular invasion: Present, focal Peri-neural invasion: Not identified. Tumor deposit(s) (discontinuous extramural extension): Not identified. Resection margins: Proximal margin: 14.1 cm Distal margin: 15.8 cm Circumferential (radial) (posterior ascending, posterior descending; lateral and posterior mid-rectum; and entire lower 1/3 rectum): 3.0 cm 1 of 3 FINAL for Taylor Whitney, Taylor Whitney ([id number]) Microscopic Comment(continued) Treatment effect (neo-adjuvant therapy): N/A Additional polyp(s): Not identified. Non-neoplastic findings: Diverticular disease and splenule. Lymph nodes: number examined - 27; number positive: 1 Pathologic Staging: pT3, pN1a Ancillary studies: A block will be sent for MMR testing by IHC and a separate block will be sent for MSI testing by PCR. Additional studies can be performed upon clinician request. (JBK:gt, 01/19/16) Enid Cutter MD Pathologist, Electronic Signature (Case signed 01/19/2016)     Diagnosis 01/17/2016 Colon, biopsy, descending - POSITIVE FOR ADENOCARCINOMA. Microscopic Comment The findings are called to Dr. Fuller Plan on 01/18/16. Dr. Orene Desanctis has seen this case in consultation with agreement.  (RAH;gt, 01/18/16)   RADIOGRAPHIC STUDIES: I have personally reviewed the radiological images as listed and agreed with the findings in the report. No results found. Sigmoidoscopy 01/17/2016 - Malignant appearing, obstructing tumor in the descending colon. Biopsied. - Otherwise normal flex sig however limited by poor prep.  ASSESSMENT & PLAN:  45 year old with past medical history of diabetes, remote history of Hodgkin lymphoma, presented with bowel obstruction.  1. Cancer of left colon, invasive adenocarcinoma, well-differentiated, pT3N1aM0, stage IIIB, MSI-stable  -I reviewed her surgical pathology findings with patient and her sisters in great details -Her CT of the abdomen was negative for distant metastasis -I reviewed her staging CT chest which showed no evidence of metastasis. -We discussed risk of cancer recurrence after complete surgical resection. Given her positive lymph nodes, stage IIIB disease, LVI (+), she is at high risk for recurrence.  -We discussed the standard care for stage III colon cancer is adjuvant chemotherapy. Given his young age, I recommend combined chemotherapy, FOLFOX or CAPEOX. she opted CAPEOX. -We discussed the new data from pooled 6 phase 3 trial investigating duration of adjuvant oxaliplatin-based therapy (3 versus 6 months) for stage III colon cancer which was reported in ASCO 2017 recently, showed similar three-year disease-free survival of 3 months versus 6 months in patients with T1-3N1 disease. Based on the new data, I recommend her to have 3 months (instead of 6 months) CAPEOX, chemo consent obtianed -She tolerated the first two cycles chemotherapy well, lab reviewed with her, her CBC and CMP are unremarkable except mild anemia, she is clinically doing well, we'll proceed third cycle CAPOX today   2. Iron deficient anemia -Her on study showed iron deficiency, probably from her previous GI bleeding from her colon cancer. -We discussed the treatment  option of oral iron supplement versus IV iron. She will start ferrous sulfate 1-2 tablets a day, with vitamin C or orange juice.  If she has poor response or tolerance to oral iron, I'll consider IV iron. -Her anemia has improved, she will continue oral iron pill  3. 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  This deletion is likely pathogenic with respect to EPCAM function, but it IS NOT expected to cause Lynch syndrome.  Two pathogenic mutations in EPCAM can cause a rare autosomal recessive condition called congenital diarrhea with tufting enteropathy (CTE).  The Colorectal Cancer Panel offered by [facility] includes sequencing and/or duplication/deletion testing of the following 19 genes: APC, ATM, AXIN2, BMPR1A, CDH1, CHEK2, EPCAM, MLH1, MSH2, MSH6, MUTYH, PMS2, POLD1, POLE, PTEN, SCG5/GREM1, SMAD4, STK11, and TP53. The report date is March 27, 2016.  4. DM -She was diagnosed with diabetes in earlier 2017. She is on metformin -I strongly encouraged her to monitor her blood sugar at home -We discussed chemotherapy may impact her blood sugar level, especially premedication dexamethasone.   5. Hypokalemia -She is on potassium chloride 20 mg daily, potassium still low, 3.2 today  -Continue potassium chloride, increase to twice daily for 5 days, then once daily  PLAN -lab reviewed, we will start third cycle  Xeloda and Oxaliplatin today -She'll return in 3 weeks for next cycle CAPOX    All questions were answered. The patient knows to call the clinic with any problems, questions or concerns. I spent 20 minutes counseling the patient face to face. The total time spent in the appointment  was 25 minutes and more than 50% was on counseling.     Truitt Merle, MD 04/06/2016

## 2016-04-07 DIAGNOSIS — C189 Malignant neoplasm of colon, unspecified: Secondary | ICD-10-CM | POA: Diagnosis not present

## 2016-04-07 DIAGNOSIS — C772 Secondary and unspecified malignant neoplasm of intra-abdominal lymph nodes: Secondary | ICD-10-CM | POA: Diagnosis not present

## 2016-04-08 ENCOUNTER — Encounter: Payer: Self-pay | Admitting: Hematology

## 2016-04-10 ENCOUNTER — Ambulatory Visit: Payer: Self-pay | Admitting: General Surgery

## 2016-04-11 ENCOUNTER — Telehealth: Payer: Self-pay | Admitting: Genetic Counselor

## 2016-04-11 ENCOUNTER — Encounter: Payer: Self-pay | Admitting: Gastroenterology

## 2016-04-11 ENCOUNTER — Encounter: Payer: Self-pay | Admitting: Hematology

## 2016-04-11 ENCOUNTER — Telehealth: Payer: Self-pay | Admitting: *Deleted

## 2016-04-11 ENCOUNTER — Telehealth: Payer: Self-pay | Admitting: Hematology

## 2016-04-11 NOTE — Telephone Encounter (Signed)
LM on VM that I was trying to reach her with her test results.  Also sent an email letting her know that I wanted to speak with her.

## 2016-04-11 NOTE — Telephone Encounter (Signed)
Per LOS I have scheduled appts and notified the scheduler 

## 2016-04-11 NOTE — Telephone Encounter (Signed)
Message sent to chemo scheduler to add chemo per 04/06/16 los. OK to start chemo on 04/28/16 per Dr Burr Medico.  Appointments scheduled per 04/11/16 los.  Copy of  AVS report and appointment schedule was given to patient, per 04/11/16 los.

## 2016-04-13 DIAGNOSIS — Z85038 Personal history of other malignant neoplasm of large intestine: Secondary | ICD-10-CM | POA: Diagnosis not present

## 2016-04-13 DIAGNOSIS — Z933 Colostomy status: Secondary | ICD-10-CM | POA: Diagnosis not present

## 2016-04-17 ENCOUNTER — Encounter: Payer: BLUE CROSS/BLUE SHIELD | Admitting: Gastroenterology

## 2016-04-17 ENCOUNTER — Encounter: Payer: Self-pay | Admitting: Gastroenterology

## 2016-04-17 ENCOUNTER — Ambulatory Visit (AMBULATORY_SURGERY_CENTER): Payer: BLUE CROSS/BLUE SHIELD | Admitting: Gastroenterology

## 2016-04-17 VITALS — BP 113/64 | HR 87 | Temp 98.2°F | Resp 13 | Ht 63.0 in | Wt 154.0 lb

## 2016-04-17 DIAGNOSIS — Z85038 Personal history of other malignant neoplasm of large intestine: Secondary | ICD-10-CM | POA: Diagnosis not present

## 2016-04-17 DIAGNOSIS — C186 Malignant neoplasm of descending colon: Secondary | ICD-10-CM | POA: Diagnosis present

## 2016-04-17 LAB — GLUCOSE, CAPILLARY: Glucose-Capillary: 113 mg/dL — ABNORMAL HIGH (ref 65–99)

## 2016-04-17 MED ORDER — SODIUM CHLORIDE 0.9 % IV SOLN
500.0000 mL | INTRAVENOUS | Status: DC
Start: 2016-04-17 — End: 2016-05-05

## 2016-04-17 NOTE — Op Note (Addendum)
[place] Patient Name: Taylor Whitney Procedure Date: 04/17/2016 10:49 AM MRN: [id number] Endoscopist: Ladene Artist , MD Age: 45 Referring MD:  Date of Birth: 03/16/1971 Gender: Female Account #: [id number] Procedure:                Colonoscopy Indications:              High risk colon cancer surveillance: Personal                            history of colon cancer Medicines:                Monitored Anesthesia Care Procedure:                Pre-Anesthesia Assessment:                           - Prior to the procedure, a History and Physical                            was performed, and patient medications and                            allergies were reviewed. The patient's tolerance of                            previous anesthesia was also reviewed. The risks                            and benefits of the procedure and the sedation                            options and risks were discussed with the patient.                            All questions were answered, and informed consent                            was obtained. Prior Anticoagulants: The patient has                            taken no previous anticoagulant or antiplatelet                            agents. ASA Grade Assessment: II - A patient with                            mild systemic disease. After reviewing the risks                            and benefits, the patient was deemed in                            satisfactory condition to undergo the procedure.  After obtaining informed consent, the colonoscope                            was passed under direct vision. Throughout the                            procedure, the patient's blood pressure, pulse, and                            oxygen saturations were monitored continuously. The                            Model PCF-[id number]) scope was introduced                            through the anus and advanced  to the the cecum,                            identified by appendiceal orifice and ileocecal                            valve. The ileocecal valve, appendiceal orifice,                            and rectum were photographed. The quality of the                            bowel preparation was unsatisfactory even after                            extensive lavage and suctioning. The cecum and                            ascending were the least well prepped. The                            colonoscopy was performed without difficulty. The                            patient tolerated the procedure well. Scope In: 11:02:37 AM Scope Out: 11:09:45 AM Scope Withdrawal Time: 0 hours 5 minutes 28 seconds  Total Procedure Duration: 0 hours 7 minutes 8 seconds  Findings:                 There was evidence of a widely patent end colostomy                            in the transverse colon. This was characterized by                            healthy appearing mucosa.                           The transverse colon, hepatic flexure, ascending  colon and cecum appeared normal. Complications:            No immediate complications. Estimated blood loss:                            None. Estimated Blood Loss:     Estimated blood loss: none. Impression:               - Preparation of the colon was unsatisfactory.                           - Widely patent end colostomy with healthy                            appearing mucosa in the transverse colon.                           - The transverse colon, hepatic flexure, ascending                            colon and cecum are normal.                           - No specimens collected. Recommendation:           - Repeat colonoscopy in 6 months, with a more                            extensive bowel prep, because the bowel preparation                            was suboptimal.                           - Patient has a contact number  available for                            emergencies. The signs and symptoms of potential                            delayed complications were discussed with the                            patient. Return to normal activities tomorrow.                            Written discharge instructions were provided to the                            patient.                           - Resume previous diet.                           - Continue present medications. Ladene Artist, MD 04/17/2016 11:21:09 AM This report has been signed electronically.

## 2016-04-17 NOTE — Progress Notes (Signed)
Report to PACU, RN, vss, BBS= Clear.  

## 2016-04-18 ENCOUNTER — Telehealth: Payer: Self-pay

## 2016-04-18 LAB — GLUCOSE, CAPILLARY: Glucose-Capillary: 143 mg/dL — ABNORMAL HIGH (ref 65–99)

## 2016-04-18 NOTE — Telephone Encounter (Signed)
  Follow up Call-  Call back number 04/17/2016  Post procedure Call Back phone  # [phone]  Permission to leave phone message Yes  Some recent data might be hidden    Patient was called for follow up after her procedure on 04/17/2016. No answer at the number given for follow up phone call. A message was left on the answering machine.

## 2016-04-18 NOTE — Telephone Encounter (Signed)
  Follow up Call-  Call back number 04/17/2016  Post procedure Call Back phone  # [phone]  Permission to leave phone message Yes  Some recent data might be hidden    Patient was called for follow up after her procedure on 04/17/2016. No answer at the number given for follow up phone call. A message was left on the answering machine.

## 2016-04-25 DIAGNOSIS — Z85038 Personal history of other malignant neoplasm of large intestine: Secondary | ICD-10-CM | POA: Diagnosis not present

## 2016-04-25 DIAGNOSIS — Z933 Colostomy status: Secondary | ICD-10-CM | POA: Diagnosis not present

## 2016-04-27 NOTE — Progress Notes (Signed)
[facility]  [phone] [phone]  Clinic Follow up Note   Patient Care Team: Donald Prose, MD as PCP - General (Family Medicine) Truitt Merle, MD as Consulting Physician (Hematology) Mickeal Skinner, MD as Consulting Physician (General Surgery) 04/28/2016   CHIEF COMPLAINTS:  Follow up stage IIIB colon cancer   Oncology History   Cancer of left colon [facility])   Staging form: Colon and Rectum, AJCC 7th Edition   - Pathologic stage from 01/18/2016: Stage IIIB (T3, N1a, cM0) - Signed by Truitt Merle, MD on 02/01/2016      Cancer of left colon ([place])   01/17/2016 Initial Diagnosis    Cancer of left colon ([place])      01/17/2016 Procedure    Flexible sigmoidoscopy showed malignant-appearing obstructing tumor in the descending colon, biopsied.      01/18/2016 Surgery    Patient underwent left hemicolectomy       01/18/2016 Pathology Results    Left hemicolectomy showed invasive adenocarcinoma, well differentiated, 4.1 cm, tumor extends into pericolonic soft tissue, lymphovascular invasion is identified, margins were negative, 1 out of 27 lymph nodes were positive.       HISTORY OF PRESENTING ILLNESS (02/01/2016):  Taylor Whitney 45 y.o. female is here because of her recently diagnosed stage IIIB colon cancer, she presents to my clinic by herself, and her two sisters were on the phone during her visit.   She presents to [facility] hospital on 01/16/2016 with nausea and vomiting for one day. She had a CT scan and flexible sigmoidoscopy which showed a near obstructing mass in the descending colon. She was seen by surgeon Dr. Kieth Brightly and underwent left colectomy and end colostomy. There were minor injury to the duodenum during her surgery, postop study showed no leakage from duodenum. She tolerated surgery and recovered well, was discharged home after 9 days of hospital stay.  She has been recovering well from surgery, has mimimum pain at incisions, her  appetite is still relatively low, her energy level has improved, she table to function and tolerate daily activities without much difficulty. She lost about 20lbs in the past few months.  She was diagnosed with DM 6 months ago, on metformin, does not check her blood glucose at home.  She was diagnosed with HL stage III in 1996, she was diagnosed and treated in [place], s/p chemo ABVD 9 months (bleomycin was held earlier due to pulmonary toxicities), no radiation. She followed with her oncologist for 5 years after treatment.   CURRENT THERAPY: adjuvant chemo CAPOX, started on 02/24/16  INTERIM HISTORY: Taylor Whitney returns for follow-up and fourth (last) cycle chemotherapy. Accompanied by her sister. She did well with her last cycle of treatment - she did notice that the side effects seemed to last longer. She reports feeling more tired. She denies any numbness or tingling of fingers or toes at this time. She denies any rash, though she does report dry skin. She already took Xeloda this morning. She has been eating really well this week. She denies bowel disturbances. She had a colonoscopy on 04/17/2016 with repeat colonoscopy in 6 months. She has received a flu shot this year.   MEDICAL HISTORY:  Past Medical History:  Diagnosis Date  . Adenocarcinoma of descending colon ([place]) 12/2015   Flex sigmoidoscopy biopsy of descending colon mass is adenocarcinoma/notes 01/18/2016  . Cancer ([place])    Hodgkin's lymphoma  . Depression   . Family history of kidney cancer   . Hodgkin lymphoma ([place])  dx'd 01/1995  . Migraine    "maybe once/month" (01/18/2016)  . Type II diabetes mellitus ([place]) 06/2015    SURGICAL HISTORY: Past Surgical History:  Procedure Laterality Date  . COLON RESECTION N/A 01/18/2016   Procedure: OPEN PARTIAL COLECTOMY WITH END COLOSTOMY;  Surgeon: Mickeal Skinner, MD;  Location: [facility];  Service: General;  Laterality: N/A;  . COLOSTOMY  01/18/2016  . DILATION AND CURETTAGE OF UTERUS     . FLEXIBLE SIGMOIDOSCOPY N/A 01/17/2016   Procedure: FLEXIBLE SIGMOIDOSCOPY;  Surgeon: Ladene Artist, MD;  Location: [facility] ENDOSCOPY;  Service: Endoscopy;  Laterality: N/A;  . LYMPH NODE BIOPSY  1996  . MANDIBLE SURGERY  1988  . PARTIAL COLECTOMY  01/18/2016    SOCIAL HISTORY: Social History   Social History  . Marital status: Single    Spouse name: N/A  . Number of children: 0  . Years of education: N/A   Occupational History  . Professor     [employer]   Social History Main Topics  . Smoking status: Never Smoker  . Smokeless tobacco: Never Used  . Alcohol use No  . Drug use: No  . Sexual activity: Not Currently   Other Topics Concern  . 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 Diabetes Mother   . Cancer Father     prostate cancer  . Cancer Paternal Uncle     renal cancer ; smoker  . Diabetes Maternal Grandmother   . Diabetes Maternal Grandfather   . Clotting disorder Paternal Grandmother   . COPD Paternal Grandfather     ALLERGIES:  is allergic to sulfa antibiotics.  MEDICATIONS:  Current Outpatient Prescriptions  Medication Sig Dispense Refill  . acetaminophen (TYLENOL) 500 MG tablet Take 1 tablet (500 mg total) by mouth every 6 (six) hours as needed. 30 tablet 0  . capecitabine (XELODA) 500 MG tablet Take on days 1-14 of chemotherapy. (Patient taking differently: Take on days 1-14 of chemotherapy.Take 2000 mg in am and 1500 mg in pm for 14 days.) 98 tablet 3  . metFORMIN (GLUCOPHAGE-XR) 500 MG 24 hr tablet Take 500 mg by mouth 2 (two) times daily.  0  . ondansetron (ZOFRAN) 8 MG tablet Take 1 tablet (8 mg total) by mouth 2 (two) times daily as needed for refractory nausea / vomiting. Start on day 3 after  chemotherapy. 30 tablet 1  . potassium chloride SA (K-DUR,KLOR-CON) 20 MEQ tablet Take 1 tablet (20 mEq total) by mouth daily. 30 tablet 0  . prochlorperazine (COMPAZINE) 10 MG tablet Take 1 tablet (10 mg total) by mouth every 6 (six) hours as needed (Nausea or vomiting). 30 tablet 1  . venlafaxine XR (EFFEXOR-XR) 75 MG 24 hr capsule Take 225 mg by mouth at bedtime.  5   Current Facility-Administered Medications  Medication Dose Route Frequency Provider Last Rate Last Dose  . 0.9 %  sodium chloride infusion  500 mL Intravenous Continuous Ladene Artist, MD        REVIEW OF SYSTEMS:   Constitutional: Denies fevers, chills or abnormal night sweats (+) malaise/fatigue Eyes: Denies blurriness of vision, double vision or watery eyes Ears, nose, mouth, throat, and face: Denies mucositis or  sore throat Respiratory: Denies cough, dyspnea or wheezes Cardiovascular: Denies palpitation, chest discomfort or lower extremity swelling Gastrointestinal:  Denies nausea, heartburn or change in bowel habits Skin: Denies abnormal skin rashes Lymphatics: Denies new lymphadenopathy or easy bruising Neurological:Denies numbness, tingling or new weaknesses Behavioral/Psych: Mood is stable, no new changes  All other systems were reviewed with the patient and are negative.  PHYSICAL EXAMINATION: ECOG PERFORMANCE STATUS: 1 - Symptomatic but completely ambulatory  Vitals:   04/28/16 0849  BP: (!) 154/92  Pulse: 99  Resp: 18  Temp: 99.1 F (37.3 C)   Filed Weights   04/28/16 0849  Weight: 160 lb 14.4 oz (73 kg)    GENERAL:alert, no distress and comfortable SKIN: skin color, texture, turgor are normal, no rashes or significant lesions EYES: normal, conjunctiva are pink and non-injected, sclera clear OROPHARYNX:no exudate, no erythema and lips, buccal mucosa, and tongue normal  NECK: supple, thyroid normal size, non-tender, without nodularity LYMPH:  no palpable lymphadenopathy in the cervical,  axillary or inguinal LUNGS: clear to auscultation and percussion with normal breathing effort HEART: regular rate & rhythm and no murmurs and no lower extremity edema ABDOMEN:abdomen soft, non-tender and normal bowel sounds, Surgical scar has well-healed, no tenderness  Musculoskeletal:no cyanosis of digits and no clubbing  PSYCH: alert & oriented x 3 with fluent speech NEURO: no focal motor/sensory deficits  LABORATORY DATA:  I have reviewed the data as listed CBC Latest Ref Rng & Units 04/28/2016 04/06/2016 03/16/2016  WBC 3.9 - 10.3 10e3/uL 7.0 6.7 7.9  Hemoglobin 11.6 - 15.9 g/dL 10.9(L) 11.0(L) 11.0(L)  Hematocrit 34.8 - 46.6 % 33.0(L) 33.6(L) 34.3(L)  Platelets 145 - 400 10e3/uL 162 199 259   CMP Latest Ref Rng & Units 04/28/2016 04/06/2016 03/16/2016  Glucose 70 - 140 mg/dl 196(H) 157(H) 154(H)  BUN 7.0 - 26.0 mg/dL 11.0 12.3 7.5  Creatinine 0.6 - 1.1 mg/dL 0.7 0.7 0.7  Sodium 136 - 145 mEq/L 140 141 140  Potassium 3.5 - 5.1 mEq/L 3.2(L) 3.2(L) 3.2(L)  Chloride 101 - 111 mmol/L - - -  CO2 22 - 29 mEq/L 21(L) 22 23  Calcium 8.4 - 10.4 mg/dL 9.1 9.0 9.1  Total Protein 6.4 - 8.3 g/dL 7.2 7.3 7.0  Total Bilirubin 0.20 - 1.20 mg/dL 0.84 0.81 0.67  Alkaline Phos 40 - 150 U/L 90 89 77  AST 5 - 34 U/L _0 ALT 0 - 55 U/L _1 Results for JAELEY, WIKER (MRN [id number]) as of 04/28/2016 08:49  Ref. Range 01/16/2016 14:50 02/01/2016 13:48  CEA Latest Ref Range: 0.0 - 4.7 ng/mL 1.7   CEA ([facility]) Latest Ref Range: 0.00 - 5.00 ng/mL  <1.00    Results for ETOSHA, WETHERELL (MRN [id number])  Ref. Range 02/01/2016 13:48  Iron Latest Ref Range: 41 - 142 ug/dL 15 (L)  UIBC Latest Ref Range: 120 - 384 ug/dL 312  TIBC Latest Ref Range: 236 - 444 ug/dL 328  %SAT Latest Ref Range: 21 - 57 % 5 (L)  Ferritin Latest Ref Range: 9 - 269 ng/ml 22    PATHOLOGY REPORT  Diagnosis 01/18/2016 Colon, segmental resection for tumor, Left - INVASIVE ADENOCARCINOMA, WELL  DIFFERENTIATED, SPANNING 4.1 CM. - ADENOCARCINOMA EXTENDS INTO PERICOLONIC SOFT TISSUE. - LYMPHOVASCULAR INVASION IS IDENTIFIED. - THE SURGICAL RESECTION MARGINS ARE NEGATIVE FOR CARCINOMA. - METASTATIC CARCINOMA IN 1 OF 27 LYMPH NODES (1/27). - DIVERTICULAR DISEASE (GROSS DIAGNOSIS). - SEE ONCOLOGY TABLE BELOW. Microscopic Comment COLON AND  RECTUM (INCLUDING TRANS-ANAL RESECTION): Specimen: Left colon. Procedure: Resection. Tumor site: Central portion of specimen. Specimen integrity: Intact. Macroscopic intactness of mesorectum: N/A Macroscopic tumor perforation: Not identified. Invasive tumor: Maximum size: 4.1 cm Histologic type(s): Adenocarcinoma Histologic grade and differentiation: G1: well differentiated/low grade. Type of polyp in which invasive carcinoma arose: Likely tubular adenoma. Microscopic extension of invasive tumor: Extends into the pericolonic soft tissue. Lymph-Vascular invasion: Present, focal Peri-neural invasion: Not identified. Tumor deposit(s) (discontinuous extramural extension): Not identified. Resection margins: Proximal margin: 14.1 cm Distal margin: 15.8 cm Circumferential (radial) (posterior ascending, posterior descending; lateral and posterior mid-rectum; and entire lower 1/3 rectum): 3.0 cm 1 of 3 FINAL for ENEZ, MONAHAN ([id number]) Microscopic Comment(continued) Treatment effect (neo-adjuvant therapy): N/A Additional polyp(s): Not identified. Non-neoplastic findings: Diverticular disease and splenule. Lymph nodes: number examined - 27; number positive: 1 Pathologic Staging: pT3, pN1a Ancillary studies: A block will be sent for MMR testing by IHC and a separate block will be sent for MSI testing by PCR. Additional studies can be performed upon clinician request. (JBK:gt, 01/19/16) Enid Cutter MD Pathologist, Electronic Signature (Case signed 01/19/2016)     Diagnosis 01/17/2016 Colon, biopsy, descending - POSITIVE FOR  ADENOCARCINOMA. Microscopic Comment The findings are called to Dr. Fuller Plan on 01/18/16. Dr. Orene Desanctis has seen this case in consultation with agreement. (RAH;gt, 01/18/16)   RADIOGRAPHIC STUDIES: I have personally reviewed the radiological images as listed and agreed with the findings in the report. No results found. Sigmoidoscopy 01/17/2016 - Malignant appearing, obstructing tumor in the descending colon. Biopsied. - Otherwise normal flex sig however limited by poor prep.  CT Chest wo contrast 02/10/2016 IMPRESSION: 1. In both upper lobes there are single 3 mm ground-glass density nodules. No follow-up needed if patient is low-risk (and has no known or suspected primary neoplasm). Non-contrast chest CT can be considered in 12 months if patient is high-risk. This recommendation follows the consensus statement: Guidelines for Management of Incidental Pulmonary Nodules Detected on CT Images: From the Fleischner Society 2017; Radiology 2017; 284:228-243. 2. Focal thickening or small fluid density lesion along the left posteromedial hemidiaphragm, not changed from 2008, considered benign. 3. Thoracic spondylosis. 4. No characteristic findings for metastatic disease to the chest.  ASSESSMENT & PLAN:  45 year old with past medical history of diabetes, remote history of Hodgkin lymphoma, presented with bowel obstruction.  1. Cancer of left colon, invasive adenocarcinoma, well-differentiated, pT3N1aM0, stage IIIB, MSI-stable  -I previously reviewed her surgical pathology findings with patient and her sisters in great details -Her CT of the abdomen was negative for distant metastasis -I previously reviewed her staging CT chest which showed no evidence of metastasis. -We previously discussed risk of cancer recurrence after complete surgical resection. Given her positive lymph nodes, stage IIIB disease, LVI (+), she is at high risk for recurrence.  -We previously discussed the standard care for stage  III colon cancer is adjuvant chemotherapy. Given his young age, I recommend combined chemotherapy, FOLFOX or CAPEOX. she opted CAPEOX. -We previously discussed the new data from pooled 6 phase 3 trial investigating duration of adjuvant oxaliplatin-based therapy (3 versus 6 months) for stage III colon cancer which was reported in ASCO 2017 recently, showed similar three-year disease-free survival of 3 months versus 6 months in patients with T1-3N1 disease. Based on the new data, I recommend her to have 3 months (instead of 6 months) CAPEOX, chemo consent obtained -She had been tolerating chemotherapy well, lab reviewed with her, her CBC and CMP are unremarkable except mild anemia and  hyperglycemia, she is clinically doing well, we'll proceed fourth (last) cycle CAPOX today  -Colonoscopy 04/17/2016 with Dr. Fuller Plan reviewed. She will repeat colonoscopy in 6 months. -She will return to clinic in about 1 month for follow up. -We discussed colon cancer surveillance. We like to see her every 3-4 months for the first 2-3 years, then every 6 months afterwards. I'll obtain a surveillance CT scan once a year, for up to 5 years.  2. Iron deficient anemia -Her iron study showed iron deficiency, probably from her previous GI bleeding from her colon cancer. -We previously discussed the treatment option of oral iron supplement versus IV iron. She will start ferrous sulfate 1-2 tablets a day, with vitamin C or orange juice.  If she has poor response or tolerance to oral iron, I'll consider IV iron. -Her anemia has improved, she will continue oral iron pill  3. 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  This deletion is likely pathogenic with respect to EPCAM function, but it IS NOT expected to cause Lynch syndrome.  Two pathogenic mutations in EPCAM can cause a rare autosomal recessive condition called congenital diarrhea with tufting enteropathy (CTE).  The Colorectal Cancer Panel offered by [facility] includes sequencing and/or duplication/deletion testing of the following 19 genes: APC, ATM, AXIN2, BMPR1A, CDH1, CHEK2, EPCAM, MLH1, MSH2, MSH6, MUTYH, PMS2, POLD1, POLE, PTEN, SCG5/GREM1, SMAD4, STK11, and TP53. The report date is March 27, 2016.  4. DM -She was diagnosed with diabetes in earlier 2017. She is on metformin -I strongly encouraged her to monitor her blood sugar at home -We discussed chemotherapy may impact her blood sugar level, especially premedication dexamethasone.  -Her random blood glucose was 196 today, I strongly encouraged her to follow-up with her primary care physician  5. Hypokalemia -She is on potassium chloride 20 mg daily, potassium level low at 3.2 today. Potassium chloride refilled today. -Continue potassium chloride, increase to twice daily for 5 days, then once daily  PLAN -lab reviewed, we will start fourth cycle  Xeloda and Oxaliplatin today -Potassium chloride refilled today -RTC in 4 to 5 weeks for follow up.   All questions were answered. The patient knows to call the clinic with any problems, questions or concerns. I spent 20 minutes counseling the patient face to face. The total time spent in the appointment was 25 minutes and more than 50% was on counseling.  This document serves as a record of services personally performed by Truitt Merle, MD. It was created on her behalf by Arlyce Harman, a trained medical scribe. The creation of this record is based on the scribe's personal observations and the provider's statements to them. This document has been checked and approved by the attending provider.     Truitt Merle, MD 04/28/2016

## 2016-04-28 ENCOUNTER — Encounter: Payer: Self-pay | Admitting: *Deleted

## 2016-04-28 ENCOUNTER — Telehealth: Payer: Self-pay | Admitting: Hematology

## 2016-04-28 ENCOUNTER — Other Ambulatory Visit (HOSPITAL_BASED_OUTPATIENT_CLINIC_OR_DEPARTMENT_OTHER): Payer: BLUE CROSS/BLUE SHIELD

## 2016-04-28 ENCOUNTER — Ambulatory Visit (HOSPITAL_BASED_OUTPATIENT_CLINIC_OR_DEPARTMENT_OTHER): Payer: BLUE CROSS/BLUE SHIELD | Admitting: Hematology

## 2016-04-28 ENCOUNTER — Ambulatory Visit (HOSPITAL_BASED_OUTPATIENT_CLINIC_OR_DEPARTMENT_OTHER): Payer: BLUE CROSS/BLUE SHIELD

## 2016-04-28 ENCOUNTER — Encounter: Payer: Self-pay | Admitting: Hematology

## 2016-04-28 VITALS — BP 154/92 | HR 99 | Temp 99.1°F | Resp 18 | Ht 63.0 in | Wt 160.9 lb

## 2016-04-28 DIAGNOSIS — D509 Iron deficiency anemia, unspecified: Secondary | ICD-10-CM | POA: Diagnosis not present

## 2016-04-28 DIAGNOSIS — C186 Malignant neoplasm of descending colon: Secondary | ICD-10-CM

## 2016-04-28 DIAGNOSIS — Z5111 Encounter for antineoplastic chemotherapy: Secondary | ICD-10-CM

## 2016-04-28 DIAGNOSIS — E876 Hypokalemia: Secondary | ICD-10-CM

## 2016-04-28 LAB — COMPREHENSIVE METABOLIC PANEL
ALT: 10 U/L (ref 0–55)
ANION GAP: 11 meq/L (ref 3–11)
AST: 20 U/L (ref 5–34)
Albumin: 3.3 g/dL — ABNORMAL LOW (ref 3.5–5.0)
Alkaline Phosphatase: 90 U/L (ref 40–150)
BILIRUBIN TOTAL: 0.84 mg/dL (ref 0.20–1.20)
BUN: 11 mg/dL (ref 7.0–26.0)
CO2: 21 meq/L — AB (ref 22–29)
CREATININE: 0.7 mg/dL (ref 0.6–1.1)
Calcium: 9.1 mg/dL (ref 8.4–10.4)
Chloride: 107 mEq/L (ref 98–109)
EGFR: >90 mL/min/{1.73_m2} (ref >90)
GLUCOSE: 196 mg/dL — AB (ref 70–140)
Potassium: 3.2 mEq/L — ABNORMAL LOW (ref 3.5–5.1)
Sodium: 140 mEq/L (ref 136–145)
TOTAL PROTEIN: 7.2 g/dL (ref 6.4–8.3)

## 2016-04-28 LAB — CBC WITH DIFFERENTIAL/PLATELET
BASO%: 0.6 % (ref 0.0–2.0)
Basophils Absolute: 0 10*3/uL (ref 0.0–0.1)
EOS%: 0.8 % (ref 0.0–7.0)
Eosinophils Absolute: 0.1 10*3/uL (ref 0.0–0.5)
HCT: 33 % — ABNORMAL LOW (ref 34.8–46.6)
HGB: 10.9 g/dL — ABNORMAL LOW (ref 11.6–15.9)
LYMPH#: 2 10*3/uL (ref 0.9–3.3)
LYMPH%: 28 % (ref 14.0–49.7)
MCH: 28.4 pg (ref 25.1–34.0)
MCHC: 33.2 g/dL (ref 31.5–36.0)
MCV: 85.6 fL (ref 79.5–101.0)
MONO#: 0.7 10*3/uL (ref 0.1–0.9)
MONO%: 10.6 % (ref 0.0–14.0)
NEUT%: 60 % (ref 38.4–76.8)
NEUTROS ABS: 4.2 10*3/uL (ref 1.5–6.5)
PLATELETS: 162 10*3/uL (ref 145–400)
RBC: 3.86 10*6/uL (ref 3.70–5.45)
RDW: 30.2 % — AB (ref 11.2–14.5)
WBC: 7 10*3/uL (ref 3.9–10.3)

## 2016-04-28 LAB — CEA (IN HOUSE-CHCC): CEA (CHCC-In House): 3.11 ng/mL (ref 0.00–5.00)

## 2016-04-28 MED ORDER — OXALIPLATIN CHEMO INJECTION 100 MG/20ML
130.0000 mg/m2 | Freq: Once | INTRAVENOUS | Status: AC
  Administered 2016-04-28: 225 mg via INTRAVENOUS
  Filled 2016-04-28: qty 20

## 2016-04-28 MED ORDER — DEXAMETHASONE SODIUM PHOSPHATE 10 MG/ML IJ SOLN
INTRAMUSCULAR | Status: AC
  Filled 2016-04-28: qty 1

## 2016-04-28 MED ORDER — PALONOSETRON HCL INJECTION 0.25 MG/5ML
INTRAVENOUS | Status: AC
  Filled 2016-04-28: qty 5

## 2016-04-28 MED ORDER — POTASSIUM CHLORIDE CRYS ER 20 MEQ PO TBCR: 20.0000 meq | EXTENDED_RELEASE_TABLET | Freq: Every day | ORAL | 0 refills | Status: DC

## 2016-04-28 MED ORDER — PALONOSETRON HCL INJECTION 0.25 MG/5ML
0.2500 mg | Freq: Once | INTRAVENOUS | Status: AC
  Administered 2016-04-28: 0.25 mg via INTRAVENOUS

## 2016-04-28 MED ORDER — DEXAMETHASONE SODIUM PHOSPHATE 10 MG/ML IJ SOLN
10.0000 mg | Freq: Once | INTRAMUSCULAR | Status: AC
  Administered 2016-04-28: 10 mg via INTRAVENOUS

## 2016-04-28 MED ORDER — DEXTROSE 5 % IV SOLN
Freq: Once | INTRAVENOUS | Status: AC
  Administered 2016-04-28: 10:00:00 via INTRAVENOUS

## 2016-04-28 NOTE — Progress Notes (Signed)
Oncology Nurse Navigator Documentation  Oncology Nurse Navigator Flowsheets 04/28/2016  Navigator Location [facility]  Referral date to RadOnc/MedOnc -  Navigator Encounter Type Treatment  Telephone -  Abnormal Finding Date -  Confirmed Diagnosis Date -  Surgery Date -  Treatment Initiated Date -  Patient Visit Type MedOnc  Treatment Phase Final Chemo TX--CAPOX  Barriers/Navigation Needs No barriers at this time;No Questions;No Needs  Education -  Interventions -  Coordination of Care -  Education Method -  Support Groups/Services FYNN;Other--Live Well Flyer provided and explained the sessions  Specialty Items/DME -  Acuity Level 1  Time Spent with Patient 15

## 2016-04-28 NOTE — Telephone Encounter (Signed)
Appointments scheduled per 04/28/16 los. A copy of the AVS report and appointment schedule was given to the patient, per 04/28/16 los.

## 2016-04-28 NOTE — Patient Instructions (Signed)
[facility] Discharge Instructions for Patients Receiving Chemotherapy  Today you received the following chemotherapy agents Oxaliplatin  To help prevent nausea and vomiting after your treatment, we encourage you to take your nausea medication    If you develop nausea and vomiting that is not controlled by your nausea medication, call the clinic.   BELOW ARE SYMPTOMS THAT SHOULD BE REPORTED IMMEDIATELY:  *FEVER GREATER THAN 100.5 F  *CHILLS WITH OR WITHOUT FEVER  NAUSEA AND VOMITING THAT IS NOT CONTROLLED WITH YOUR NAUSEA MEDICATION  *UNUSUAL SHORTNESS OF BREATH  *UNUSUAL BRUISING OR BLEEDING  TENDERNESS IN MOUTH AND THROAT WITH OR WITHOUT PRESENCE OF ULCERS  *URINARY PROBLEMS  *BOWEL PROBLEMS  UNUSUAL RASH Items with * indicate a potential emergency and should be followed up as soon as possible.  Feel free to call the clinic you have any questions or concerns. The clinic phone number is [phone].  Please show the CHEMO ALERT CARD at check-in to the Emergency Department and triage nurse.   

## 2016-05-08 NOTE — Patient Instructions (Addendum)
Aulora Konja Dudek  05/08/2016   Your procedure is scheduled on:   Report to [facility] Main  Entrance take [facility]  elevators to 3rd floor to  [place] at AM.  Call this number if you have problems the morning of surgery [phone]  Bowel prep per MD instructions -Drink clear liquids plentiful day before surgery.  Remember: ONLY 1 PERSON MAY GO WITH YOU TO SHORT STAY TO GET  READY MORNING OF YOUR SURGERY.  Do not eat food or drink liquids :After Midnight. Exception-may have Clear Liquids 12 midnight to 0700 AM, then nothing.   CLEAR LIQUID DIET   Foods Allowed                                                                     Foods Excluded  Coffee and tea, regular and decaf                             liquids that you cannot  Plain Jell-O in any flavor                                             see through such as: Fruit ices (not with fruit pulp)                                     milk, soups, orange juice  Iced Popsicles                                    All solid food Carbonated beverages, regular and diet                                    Cranberry, grape and apple juices Sports drinks like Gatorade Lightly seasoned clear broth or consume(fat free) Sugar, honey syrup  _____________________________________________________________________       Take these medicines the morning of surgery with A SIP OF WATER: NONE. DO NOT TAKE ANY DIABETIC MEDICATIONS DAY OF YOUR SURGERY                               You may not have any metal on your body including hair pins and              piercings  Do not wear jewelry, make-up, lotions, powders or perfumes, deodorant             Do not wear nail polish.  Do not shave  48 hours prior to surgery.              Men may shave face and neck.   Do not bring valuables to the hospital. [facility] IS NOT  RESPONSIBLE   FOR VALUABLES.  Contacts, dentures or bridgework may not be worn into  surgery.  Leave suitcase in the car. After surgery it may be brought to your room.     Patients discharged the day of surgery will not be allowed to drive home.  Name and phone number of your driver: Eustaquio Maize- sister [phone]  Special Instructions: N/A              Please read over the following fact sheets you were given: _____________________________________________________________________             [facility] - Preparing for Surgery Before surgery, you can play an important role.  Because skin is not sterile, your skin needs to be as free of germs as possible.  You can reduce the number of germs on your skin by washing with CHG (chlorahexidine gluconate) soap before surgery.  CHG is an antiseptic cleaner which kills germs and bonds with the skin to continue killing germs even after washing. Please DO NOT use if you have an allergy to CHG or antibacterial soaps.  If your skin becomes reddened/irritated stop using the CHG and inform your nurse when you arrive at Short Stay. Do not shave (including legs and underarms) for at least 48 hours prior to the first CHG shower.  You may shave your face/neck. Please follow these instructions carefully:  1.  Shower with CHG Soap the night before surgery and the  morning of Surgery.  2.  If you choose to wash your hair, wash your hair first as usual with your  normal  shampoo.  3.  After you shampoo, rinse your hair and body thoroughly to remove the  shampoo.                           4.  Use CHG as you would any other liquid soap.  You can apply chg directly  to the skin and wash                       Gently with a scrungie or clean washcloth.  5.  Apply the CHG Soap to your body ONLY FROM THE NECK DOWN.   Do not use on face/ open                           Wound or open sores. Avoid contact with eyes, ears mouth and genitals (private parts).                       Wash face,  Genitals (private parts) with your normal soap.             6.  Wash  thoroughly, paying special attention to the area where your surgery  will be performed.  7.  Thoroughly rinse your body with warm water from the neck down.  8.  DO NOT shower/wash with your normal soap after using and rinsing off  the CHG Soap.                9.  Pat yourself dry with a clean towel.            10.  Wear clean pajamas.            11.  Place clean sheets on your bed the night of your first shower and do not  sleep with pets. Day  of Surgery : Do not apply any lotions/deodorants the morning of surgery.  Please wear clean clothes to the hospital/surgery center.  FAILURE TO FOLLOW THESE INSTRUCTIONS MAY RESULT IN THE CANCELLATION OF YOUR SURGERY  ________________________________________________________________________ How to Manage Your Diabetes Before and After Surgery  Why is it important to control my blood sugar before and after surgery? . Improving blood sugar levels before and after surgery helps healing and can limit problems. . A way of improving blood sugar control is eating a healthy diet by: o  Eating less sugar and carbohydrates o  Increasing activity/exercise o  Talking with your doctor about reaching your blood sugar goals . High blood sugars (greater than 180 mg/dL) can raise your risk of infections and slow your recovery, so you will need to focus on controlling your diabetes during the weeks before surgery. . Make sure that the doctor who takes care of your diabetes knows about your planned surgery including the date and location.  How do I manage my blood sugar before surgery? . Check your blood sugar at least 4 times a day, starting 2 days before surgery, to make sure that the level is not too high or low. o Check your blood sugar the morning of your surgery when you wake up and every 2 hours until you get to the Short Stay unit. . If your blood sugar is less than 70 mg/dL, you will need to treat for low blood sugar: o Do not take insulin. o Treat a low  blood sugar (less than 70 mg/dL) with  cup of clear juice (cranberry or apple), 4 glucose tablets, OR glucose gel. o Recheck blood sugar in 15 minutes after treatment (to make sure it is greater than 70 mg/dL). If your blood sugar is not greater than 70 mg/dL on recheck, call [phone] for further instructions. . Report your blood sugar to the short stay nurse when you get to Short Stay.  . If you are admitted to the hospital after surgery: o Your blood sugar will be checked by the staff and you will probably be given insulin after surgery (instead of oral diabetes medicines) to make sure you have good blood sugar levels. o The goal for blood sugar control after surgery is 80-180 mg/dL.   WHAT DO I DO ABOUT MY DIABETES MEDICATION?  Marland Kitchen Do not take oral diabetes medicines (pills) the morning of surgery.   Patient Signature:  Date:   Nurse Signature:  Date:   Reviewed and Endorsed by [facility] Patient Education Committee, August 2015

## 2016-05-09 ENCOUNTER — Encounter (HOSPITAL_COMMUNITY): Payer: Self-pay

## 2016-05-09 ENCOUNTER — Encounter (HOSPITAL_COMMUNITY)
Admission: RE | Admit: 2016-05-09 | Discharge: 2016-05-09 | Disposition: A | Discharge status: Home / Self Care | Payer: BLUE CROSS/BLUE SHIELD | Source: Ambulatory Visit | Attending: General Surgery | Admitting: General Surgery

## 2016-05-09 DIAGNOSIS — Z01812 Encounter for preprocedural laboratory examination: Secondary | ICD-10-CM | POA: Insufficient documentation

## 2016-05-09 DIAGNOSIS — K9409 Other complications of colostomy: Secondary | ICD-10-CM | POA: Diagnosis not present

## 2016-05-09 DIAGNOSIS — Z5331 Laparoscopic surgical procedure converted to open procedure: Secondary | ICD-10-CM | POA: Diagnosis not present

## 2016-05-09 DIAGNOSIS — K56699 Other intestinal obstruction unspecified as to partial versus complete obstruction: Secondary | ICD-10-CM | POA: Diagnosis not present

## 2016-05-09 DIAGNOSIS — C189 Malignant neoplasm of colon, unspecified: Secondary | ICD-10-CM | POA: Diagnosis not present

## 2016-05-09 DIAGNOSIS — D509 Iron deficiency anemia, unspecified: Secondary | ICD-10-CM | POA: Diagnosis not present

## 2016-05-09 DIAGNOSIS — Z79899 Other long term (current) drug therapy: Secondary | ICD-10-CM | POA: Diagnosis not present

## 2016-05-09 DIAGNOSIS — Z85038 Personal history of other malignant neoplasm of large intestine: Secondary | ICD-10-CM | POA: Diagnosis not present

## 2016-05-09 DIAGNOSIS — Z882 Allergy status to sulfonamides status: Secondary | ICD-10-CM | POA: Diagnosis not present

## 2016-05-09 DIAGNOSIS — K660 Peritoneal adhesions (postprocedural) (postinfection): Secondary | ICD-10-CM | POA: Diagnosis not present

## 2016-05-09 DIAGNOSIS — Z433 Encounter for attention to colostomy: Secondary | ICD-10-CM | POA: Diagnosis not present

## 2016-05-09 DIAGNOSIS — D649 Anemia, unspecified: Secondary | ICD-10-CM | POA: Diagnosis not present

## 2016-05-09 DIAGNOSIS — K56609 Unspecified intestinal obstruction, unspecified as to partial versus complete obstruction: Secondary | ICD-10-CM | POA: Diagnosis not present

## 2016-05-09 DIAGNOSIS — F329 Major depressive disorder, single episode, unspecified: Secondary | ICD-10-CM | POA: Diagnosis not present

## 2016-05-09 DIAGNOSIS — E119 Type 2 diabetes mellitus without complications: Secondary | ICD-10-CM | POA: Diagnosis not present

## 2016-05-09 DIAGNOSIS — E876 Hypokalemia: Secondary | ICD-10-CM | POA: Diagnosis not present

## 2016-05-09 DIAGNOSIS — Z8571 Personal history of Hodgkin lymphoma: Secondary | ICD-10-CM | POA: Diagnosis not present

## 2016-05-09 DIAGNOSIS — Z7984 Long term (current) use of oral hypoglycemic drugs: Secondary | ICD-10-CM | POA: Diagnosis not present

## 2016-05-09 DIAGNOSIS — Z833 Family history of diabetes mellitus: Secondary | ICD-10-CM | POA: Diagnosis not present

## 2016-05-09 HISTORY — DX: Anemia, unspecified: D64.9

## 2016-05-09 LAB — HCG, SERUM, QUALITATIVE: Preg, Serum: NEGATIVE

## 2016-05-09 LAB — CBC WITH DIFFERENTIAL/PLATELET
BASOS PCT: 0 %
Basophils Absolute: 0 10*3/uL (ref 0.0–0.1)
EOS PCT: 1 %
Eosinophils Absolute: 0.1 10*3/uL (ref 0.0–0.7)
HEMATOCRIT: 30.6 % — AB (ref 36.0–46.0)
Hemoglobin: 10.5 g/dL — ABNORMAL LOW (ref 12.0–15.0)
LYMPHS PCT: 36 %
Lymphs Abs: 2.6 10*3/uL (ref 0.7–4.0)
MCH: 29.4 pg (ref 26.0–34.0)
MCHC: 34.3 g/dL (ref 30.0–36.0)
MCV: 85.7 fL (ref 78.0–100.0)
MONOS PCT: 12 %
Monocytes Absolute: 0.9 10*3/uL (ref 0.1–1.0)
NEUTROS ABS: 3.5 10*3/uL (ref 1.7–7.7)
Neutrophils Relative %: 51 %
Platelets: 123 10*3/uL — ABNORMAL LOW (ref 150–400)
RBC: 3.57 MIL/uL — ABNORMAL LOW (ref 3.87–5.11)
RDW: 24.4 % — AB (ref 11.5–15.5)
WBC: 7.1 10*3/uL (ref 4.0–10.5)

## 2016-05-09 LAB — BASIC METABOLIC PANEL
Anion gap: 8 (ref 5–15)
BUN: 9 mg/dL (ref 6–20)
CO2: 28 mmol/L (ref 22–32)
Calcium: 9.3 mg/dL (ref 8.9–10.3)
Chloride: 107 mmol/L (ref 101–111)
Creatinine, Ser: 0.68 mg/dL (ref 0.44–1.00)
GFR calc Af Amer: >60 mL/min (ref >60)
GFR calc non Af Amer: >60 mL/min (ref >60)
GLUCOSE: 103 mg/dL — AB (ref 65–99)
POTASSIUM: 3.3 mmol/L — AB (ref 3.5–5.1)
Sodium: 143 mmol/L (ref 135–145)

## 2016-05-09 LAB — GLUCOSE, CAPILLARY: GLUCOSE-CAPILLARY: 104 mg/dL — AB (ref 65–99)

## 2016-05-09 NOTE — Pre-Procedure Instructions (Signed)
EKG 8'17, CXR/abd 8'17 Epic. Labs- 04-28-16 CBC/d,CMP CEA- Epic.

## 2016-05-10 LAB — HEMOGLOBIN A1C
HEMOGLOBIN A1C: 6.3 % — AB (ref 4.8–5.6)
MEAN PLASMA GLUCOSE: 134 mg/dL

## 2016-05-11 ENCOUNTER — Inpatient Hospital Stay (HOSPITAL_COMMUNITY): Payer: BLUE CROSS/BLUE SHIELD | Admitting: Certified Registered Nurse Anesthetist

## 2016-05-11 ENCOUNTER — Encounter (HOSPITAL_COMMUNITY): Payer: Self-pay | Admitting: *Deleted

## 2016-05-11 ENCOUNTER — Encounter (HOSPITAL_COMMUNITY)
Admission: RE | Disposition: A | Discharge status: Home / Self Care | Payer: Self-pay | Source: Ambulatory Visit | Attending: General Surgery

## 2016-05-11 ENCOUNTER — Inpatient Hospital Stay (HOSPITAL_COMMUNITY)
Admission: RE | Admit: 2016-05-11 | Discharge: 2016-05-15 | DRG: 330 | Disposition: A | Discharge status: Home / Self Care | Payer: BLUE CROSS/BLUE SHIELD | Source: Ambulatory Visit | Attending: General Surgery | Admitting: General Surgery

## 2016-05-11 DIAGNOSIS — Z882 Allergy status to sulfonamides status: Secondary | ICD-10-CM | POA: Diagnosis not present

## 2016-05-11 DIAGNOSIS — K56699 Other intestinal obstruction unspecified as to partial versus complete obstruction: Secondary | ICD-10-CM | POA: Diagnosis present

## 2016-05-11 DIAGNOSIS — E119 Type 2 diabetes mellitus without complications: Secondary | ICD-10-CM | POA: Diagnosis present

## 2016-05-11 DIAGNOSIS — Z79899 Other long term (current) drug therapy: Secondary | ICD-10-CM | POA: Diagnosis not present

## 2016-05-11 DIAGNOSIS — Z433 Encounter for attention to colostomy: Secondary | ICD-10-CM | POA: Diagnosis not present

## 2016-05-11 DIAGNOSIS — F32A Depression, unspecified: Secondary | ICD-10-CM | POA: Diagnosis present

## 2016-05-11 DIAGNOSIS — C189 Malignant neoplasm of colon, unspecified: Secondary | ICD-10-CM | POA: Diagnosis not present

## 2016-05-11 DIAGNOSIS — K9409 Other complications of colostomy: Secondary | ICD-10-CM | POA: Diagnosis not present

## 2016-05-11 DIAGNOSIS — K56609 Unspecified intestinal obstruction, unspecified as to partial versus complete obstruction: Secondary | ICD-10-CM | POA: Diagnosis not present

## 2016-05-11 DIAGNOSIS — D509 Iron deficiency anemia, unspecified: Secondary | ICD-10-CM | POA: Diagnosis present

## 2016-05-11 DIAGNOSIS — Z85038 Personal history of other malignant neoplasm of large intestine: Secondary | ICD-10-CM | POA: Diagnosis not present

## 2016-05-11 DIAGNOSIS — Z833 Family history of diabetes mellitus: Secondary | ICD-10-CM | POA: Diagnosis not present

## 2016-05-11 DIAGNOSIS — E876 Hypokalemia: Secondary | ICD-10-CM | POA: Diagnosis not present

## 2016-05-11 DIAGNOSIS — K660 Peritoneal adhesions (postprocedural) (postinfection): Secondary | ICD-10-CM | POA: Diagnosis present

## 2016-05-11 DIAGNOSIS — Z5331 Laparoscopic surgical procedure converted to open procedure: Secondary | ICD-10-CM | POA: Diagnosis not present

## 2016-05-11 DIAGNOSIS — Z8571 Personal history of Hodgkin lymphoma: Secondary | ICD-10-CM | POA: Diagnosis not present

## 2016-05-11 DIAGNOSIS — D649 Anemia, unspecified: Secondary | ICD-10-CM | POA: Diagnosis present

## 2016-05-11 DIAGNOSIS — F329 Major depressive disorder, single episode, unspecified: Secondary | ICD-10-CM | POA: Diagnosis present

## 2016-05-11 DIAGNOSIS — E118 Type 2 diabetes mellitus with unspecified complications: Secondary | ICD-10-CM | POA: Diagnosis present

## 2016-05-11 DIAGNOSIS — C186 Malignant neoplasm of descending colon: Secondary | ICD-10-CM | POA: Diagnosis present

## 2016-05-11 DIAGNOSIS — Z7984 Long term (current) use of oral hypoglycemic drugs: Secondary | ICD-10-CM

## 2016-05-11 HISTORY — DX: Unspecified intestinal obstruction, unspecified as to partial versus complete obstruction: K56.609

## 2016-05-11 HISTORY — PX: PROCTOSCOPY: SHX2266

## 2016-05-11 HISTORY — PX: COLOSTOMY TAKEDOWN: SHX5258

## 2016-05-11 LAB — CREATININE, SERUM
CREATININE: 0.71 mg/dL (ref 0.44–1.00)
GFR calc Af Amer: >60 mL/min (ref >60)
GFR calc non Af Amer: >60 mL/min (ref >60)

## 2016-05-11 LAB — CBC
HCT: 27.5 % — ABNORMAL LOW (ref 36.0–46.0)
Hemoglobin: 9.4 g/dL — ABNORMAL LOW (ref 12.0–15.0)
MCH: 29.7 pg (ref 26.0–34.0)
MCHC: 34.2 g/dL (ref 30.0–36.0)
MCV: 87 fL (ref 78.0–100.0)
PLATELETS: 109 10*3/uL — AB (ref 150–400)
RBC: 3.16 MIL/uL — AB (ref 3.87–5.11)
RDW: 25.5 % — ABNORMAL HIGH (ref 11.5–15.5)
WBC: 8.4 10*3/uL (ref 4.0–10.5)

## 2016-05-11 LAB — GLUCOSE, CAPILLARY
GLUCOSE-CAPILLARY: 142 mg/dL — AB (ref 65–99)
GLUCOSE-CAPILLARY: 160 mg/dL — AB (ref 65–99)
Glucose-Capillary: 93 mg/dL (ref 65–99)

## 2016-05-11 SURGERY — CLOSURE, COLOSTOMY, LAPAROSCOPIC
Anesthesia: General | Site: Abdomen

## 2016-05-11 MED ORDER — ONDANSETRON 4 MG PO TBDP: 4.0000 mg | ORAL_TABLET | Freq: Four times a day (QID) | ORAL | Status: DC | PRN

## 2016-05-11 MED ORDER — BUPIVACAINE HCL (PF) 0.25 % IJ SOLN
INTRAMUSCULAR | Status: AC
  Filled 2016-05-11: qty 30

## 2016-05-11 MED ORDER — 0.9 % SODIUM CHLORIDE (POUR BTL) OPTIME
TOPICAL | Status: DC | PRN
  Administered 2016-05-11: 12000 mL

## 2016-05-11 MED ORDER — OXYCODONE HCL 5 MG PO TABS
5.0000 mg | ORAL_TABLET | ORAL | Status: DC | PRN
  Administered 2016-05-12 – 2016-05-13 (×4): 10 mg via ORAL
  Filled 2016-05-11 (×4): qty 2

## 2016-05-11 MED ORDER — HEPARIN SODIUM (PORCINE) 5000 UNIT/ML IJ SOLN
5000.0000 [IU] | Freq: Once | INTRAMUSCULAR | Status: AC
  Administered 2016-05-11: 5000 [IU] via SUBCUTANEOUS
  Filled 2016-05-11: qty 1

## 2016-05-11 MED ORDER — BUPIVACAINE LIPOSOME 1.3 % IJ SUSP
20.0000 mL | Freq: Once | INTRAMUSCULAR | Status: AC
  Administered 2016-05-11: 20 mL
  Filled 2016-05-11: qty 20

## 2016-05-11 MED ORDER — HYDROMORPHONE HCL 2 MG/ML IJ SOLN
1.0000 mg | INTRAMUSCULAR | Status: DC | PRN
  Administered 2016-05-12 (×2): 1 mg via INTRAVENOUS
  Filled 2016-05-11 (×2): qty 1

## 2016-05-11 MED ORDER — METOCLOPRAMIDE HCL 5 MG/ML IJ SOLN: 10.0000 mg | Freq: Once | INTRAMUSCULAR | Status: DC | PRN

## 2016-05-11 MED ORDER — HYDRALAZINE HCL 20 MG/ML IJ SOLN: 10.0000 mg | INTRAMUSCULAR | Status: DC | PRN

## 2016-05-11 MED ORDER — ALVIMOPAN 12 MG PO CAPS
12.0000 mg | ORAL_CAPSULE | Freq: Two times a day (BID) | ORAL | Status: DC
  Administered 2016-05-12 – 2016-05-13 (×4): 12 mg via ORAL
  Filled 2016-05-11 (×4): qty 1

## 2016-05-11 MED ORDER — ACETAMINOPHEN 500 MG PO TABS
1000.0000 mg | ORAL_TABLET | ORAL | Status: AC
  Administered 2016-05-11: 1000 mg via ORAL
  Filled 2016-05-11: qty 2

## 2016-05-11 MED ORDER — FENTANYL CITRATE (PF) 100 MCG/2ML IJ SOLN
INTRAMUSCULAR | Status: AC
  Filled 2016-05-11: qty 2

## 2016-05-11 MED ORDER — ROCURONIUM BROMIDE 50 MG/5ML IV SOSY
PREFILLED_SYRINGE | INTRAVENOUS | Status: AC
  Filled 2016-05-11: qty 5

## 2016-05-11 MED ORDER — FENTANYL CITRATE (PF) 100 MCG/2ML IJ SOLN
INTRAMUSCULAR | Status: DC | PRN
  Administered 2016-05-11 (×7): 50 ug via INTRAVENOUS

## 2016-05-11 MED ORDER — SODIUM CHLORIDE 0.9 % IV SOLN
1.0000 g | INTRAVENOUS | Status: AC
  Administered 2016-05-11: 1 g via INTRAVENOUS
  Filled 2016-05-11: qty 1

## 2016-05-11 MED ORDER — PROPOFOL 10 MG/ML IV BOLUS
INTRAVENOUS | Status: DC | PRN
  Administered 2016-05-11: 170 mg via INTRAVENOUS
  Administered 2016-05-11: 30 mg via INTRAVENOUS

## 2016-05-11 MED ORDER — HYDROMORPHONE HCL 2 MG/ML IJ SOLN: 0.2500 mg | INTRAMUSCULAR | Status: DC | PRN

## 2016-05-11 MED ORDER — ACETAMINOPHEN 325 MG PO TABS
650.0000 mg | ORAL_TABLET | Freq: Four times a day (QID) | ORAL | Status: DC | PRN
  Administered 2016-05-12: 650 mg via ORAL
  Filled 2016-05-11: qty 2

## 2016-05-11 MED ORDER — ROCURONIUM BROMIDE 100 MG/10ML IV SOLN
INTRAVENOUS | Status: DC | PRN
  Administered 2016-05-11 (×2): 10 mg via INTRAVENOUS
  Administered 2016-05-11: 5 mg via INTRAVENOUS
  Administered 2016-05-11: 10 mg via INTRAVENOUS
  Administered 2016-05-11: 50 mg via INTRAVENOUS
  Administered 2016-05-11: 10 mg via INTRAVENOUS

## 2016-05-11 MED ORDER — ONDANSETRON HCL 4 MG/2ML IJ SOLN: 4.0000 mg | Freq: Four times a day (QID) | INTRAMUSCULAR | Status: DC | PRN

## 2016-05-11 MED ORDER — ALVIMOPAN 12 MG PO CAPS
12.0000 mg | ORAL_CAPSULE | Freq: Once | ORAL | Status: AC
  Administered 2016-05-11: 12 mg via ORAL
  Filled 2016-05-11: qty 1

## 2016-05-11 MED ORDER — MEPERIDINE HCL 50 MG/ML IJ SOLN: 6.2500 mg | INTRAMUSCULAR | Status: DC | PRN

## 2016-05-11 MED ORDER — ONDANSETRON HCL 4 MG/2ML IJ SOLN
INTRAMUSCULAR | Status: DC | PRN
  Administered 2016-05-11: 4 mg via INTRAVENOUS

## 2016-05-11 MED ORDER — FENTANYL CITRATE (PF) 250 MCG/5ML IJ SOLN
INTRAMUSCULAR | Status: AC
  Filled 2016-05-11: qty 5

## 2016-05-11 MED ORDER — MORPHINE SULFATE (PF) 2 MG/ML IV SOLN: 2.0000 mg | INTRAVENOUS | Status: DC | PRN

## 2016-05-11 MED ORDER — KCL IN DEXTROSE-NACL 20-5-0.45 MEQ/L-%-% IV SOLN
INTRAVENOUS | Status: DC
  Administered 2016-05-11: 75 mL/h via INTRAVENOUS
  Administered 2016-05-12 – 2016-05-13 (×2): via INTRAVENOUS
  Filled 2016-05-11 (×3): qty 1000

## 2016-05-11 MED ORDER — GABAPENTIN 300 MG PO CAPS
300.0000 mg | ORAL_CAPSULE | ORAL | Status: AC
  Administered 2016-05-11: 300 mg via ORAL
  Filled 2016-05-11: qty 1

## 2016-05-11 MED ORDER — DIPHENHYDRAMINE HCL 50 MG/ML IJ SOLN: 25.0000 mg | Freq: Four times a day (QID) | INTRAMUSCULAR | Status: DC | PRN

## 2016-05-11 MED ORDER — MIDAZOLAM HCL 2 MG/2ML IJ SOLN
INTRAMUSCULAR | Status: DC | PRN
  Administered 2016-05-11: 2 mg via INTRAVENOUS

## 2016-05-11 MED ORDER — SIMETHICONE 80 MG PO CHEW: 40.0000 mg | CHEWABLE_TABLET | Freq: Four times a day (QID) | ORAL | Status: DC | PRN

## 2016-05-11 MED ORDER — HYDROMORPHONE HCL 1 MG/ML IJ SOLN
INTRAMUSCULAR | Status: DC | PRN
  Administered 2016-05-11: .4 mg via INTRAVENOUS
  Administered 2016-05-11: .6 mg via INTRAVENOUS
  Administered 2016-05-11: .4 mg via INTRAVENOUS
  Administered 2016-05-11: .6 mg via INTRAVENOUS

## 2016-05-11 MED ORDER — DIPHENHYDRAMINE HCL 25 MG PO CAPS: 25.0000 mg | ORAL_CAPSULE | Freq: Four times a day (QID) | ORAL | Status: DC | PRN

## 2016-05-11 MED ORDER — LIDOCAINE HCL (CARDIAC) 20 MG/ML IV SOLN
INTRAVENOUS | Status: DC | PRN
  Administered 2016-05-11: 70 mg via INTRAVENOUS

## 2016-05-11 MED ORDER — SUGAMMADEX SODIUM 200 MG/2ML IV SOLN
INTRAVENOUS | Status: DC | PRN
  Administered 2016-05-11: 175 mg via INTRAVENOUS

## 2016-05-11 MED ORDER — MIDAZOLAM HCL 2 MG/2ML IJ SOLN
INTRAMUSCULAR | Status: AC
  Filled 2016-05-11: qty 2

## 2016-05-11 MED ORDER — HYDROMORPHONE HCL 2 MG/ML IJ SOLN
INTRAMUSCULAR | Status: AC
  Filled 2016-05-11: qty 1

## 2016-05-11 MED ORDER — HEPARIN SODIUM (PORCINE) 5000 UNIT/ML IJ SOLN
5000.0000 [IU] | Freq: Three times a day (TID) | INTRAMUSCULAR | Status: DC
  Administered 2016-05-11 – 2016-05-15 (×11): 5000 [IU] via SUBCUTANEOUS
  Filled 2016-05-11 (×11): qty 1

## 2016-05-11 MED ORDER — LIDOCAINE 2% (20 MG/ML) 5 ML SYRINGE
INTRAMUSCULAR | Status: AC
  Filled 2016-05-11: qty 5

## 2016-05-11 MED ORDER — LABETALOL HCL 5 MG/ML IV SOLN
INTRAVENOUS | Status: DC | PRN
  Administered 2016-05-11: 2.5 mg via INTRAVENOUS

## 2016-05-11 MED ORDER — LACTATED RINGERS IV SOLN
INTRAVENOUS | Status: DC
  Administered 2016-05-11: 15:00:00 via INTRAVENOUS
  Administered 2016-05-11: 1000 mL via INTRAVENOUS
  Administered 2016-05-11 (×2): via INTRAVENOUS

## 2016-05-11 MED ORDER — DEXAMETHASONE SODIUM PHOSPHATE 10 MG/ML IJ SOLN
INTRAMUSCULAR | Status: DC | PRN
  Administered 2016-05-11: 5 mg via INTRAVENOUS

## 2016-05-11 MED ORDER — ACETAMINOPHEN 650 MG RE SUPP: 650.0000 mg | Freq: Four times a day (QID) | RECTAL | Status: DC | PRN

## 2016-05-11 MED ORDER — PROPOFOL 10 MG/ML IV BOLUS
INTRAVENOUS | Status: AC
  Filled 2016-05-11: qty 20

## 2016-05-11 MED ORDER — BUPIVACAINE HCL (PF) 0.25 % IJ SOLN
INTRAMUSCULAR | Status: DC | PRN
  Administered 2016-05-11: 30 mL

## 2016-05-11 MED ORDER — LACTATED RINGERS IR SOLN
Status: DC | PRN
  Administered 2016-05-11: 3000 mL

## 2016-05-11 SURGICAL SUPPLY — 81 items
APPLIER CLIP 5 13 M/L LIGAMAX5 (MISCELLANEOUS)
APPLIER CLIP ROT 10 11.4 M/L (STAPLE)
BENZOIN TINCTURE PRP APPL 2/3 (GAUZE/BANDAGES/DRESSINGS) ×3 IMPLANT
BLADE EXTENDED COATED 6.5IN (ELECTRODE) IMPLANT
CABLE HIGH FREQUENCY MONO STRZ (ELECTRODE) ×3 IMPLANT
CELLS DAT CNTRL 66122 CELL SVR (MISCELLANEOUS) IMPLANT
CHLORAPREP W/TINT 26ML (MISCELLANEOUS) IMPLANT
CLIP APPLIE 5 13 M/L LIGAMAX5 (MISCELLANEOUS) IMPLANT
CLIP APPLIE ROT 10 11.4 M/L (STAPLE) IMPLANT
COUNTER NEEDLE 20 DBL MAG RED (NEEDLE) ×3 IMPLANT
COVER MAYO STAND STRL (DRAPES) ×6 IMPLANT
COVER SURGICAL LIGHT HANDLE (MISCELLANEOUS) ×6 IMPLANT
DECANTER SPIKE VIAL GLASS SM (MISCELLANEOUS) ×3 IMPLANT
DERMABOND ADVANCED (GAUZE/BANDAGES/DRESSINGS)
DERMABOND ADVANCED .7 DNX12 (GAUZE/BANDAGES/DRESSINGS) IMPLANT
DRAIN CHANNEL 19F RND (DRAIN) IMPLANT
DRAPE LAPAROSCOPIC ABDOMINAL (DRAPES) ×3 IMPLANT
DRAPE UTILITY XL STRL (DRAPES) ×3 IMPLANT
DRSG OPSITE POSTOP 4X10 (GAUZE/BANDAGES/DRESSINGS) ×3 IMPLANT
DRSG OPSITE POSTOP 4X6 (GAUZE/BANDAGES/DRESSINGS) IMPLANT
DRSG OPSITE POSTOP 4X8 (GAUZE/BANDAGES/DRESSINGS) IMPLANT
DRSG TEGADERM 4X4.75 (GAUZE/BANDAGES/DRESSINGS) ×3 IMPLANT
ELECT PENCIL ROCKER SW 15FT (MISCELLANEOUS) IMPLANT
ELECT REM PT RETURN 9FT ADLT (ELECTROSURGICAL) ×3
ELECTRODE REM PT RTRN 9FT ADLT (ELECTROSURGICAL) ×2 IMPLANT
ENDOLOOP SUT PDS II  0 18 (SUTURE)
ENDOLOOP SUT PDS II 0 18 (SUTURE) IMPLANT
GAUZE SPONGE 4X4 12PLY STRL (GAUZE/BANDAGES/DRESSINGS) ×3 IMPLANT
GLOVE BIO SURGEON STRL SZ7 (GLOVE) ×6 IMPLANT
GLOVE BIOGEL PI IND STRL 7.0 (GLOVE) ×4 IMPLANT
GLOVE BIOGEL PI INDICATOR 7.0 (GLOVE) ×2
GOWN STRL REUS W/TWL XL LVL3 (GOWN DISPOSABLE) ×18 IMPLANT
HANDLE SUCTION POOLE (INSTRUMENTS) IMPLANT
HOLDER FOLEY CATH W/STRAP (MISCELLANEOUS) ×3 IMPLANT
IRRIG SUCT STRYKERFLOW 2 WTIP (MISCELLANEOUS)
IRRIGATION SUCT STRKRFLW 2 WTP (MISCELLANEOUS) IMPLANT
LEGGING LITHOTOMY PAIR STRL (DRAPES) ×3 IMPLANT
PACK COLON (CUSTOM PROCEDURE TRAY) ×3 IMPLANT
PAD POSITIONING PINK XL (MISCELLANEOUS) ×3 IMPLANT
PORT LAP GEL ALEXIS MED 5-9CM (MISCELLANEOUS) ×3 IMPLANT
POSITIONER SURGICAL ARM (MISCELLANEOUS) ×6 IMPLANT
RELOAD GREEN (STAPLE) IMPLANT
RELOAD STAPLER GREEN 60MM (STAPLE) ×2 IMPLANT
RELOAD STAPLER WHITE 60MM (STAPLE) IMPLANT
RTRCTR WOUND ALEXIS 18CM MED (MISCELLANEOUS)
SCISSORS LAP 5X35 DISP (ENDOMECHANICALS) ×3 IMPLANT
SEALER TISSUE G2 STRG ARTC 35C (ENDOMECHANICALS) IMPLANT
SEALER TISSUE X1 CVD JAW (INSTRUMENTS) IMPLANT
SHEARS HARMONIC HDI 36CM (ELECTROSURGICAL) ×3 IMPLANT
SLEEVE XCEL OPT CAN 5 100 (ENDOMECHANICALS) ×6 IMPLANT
SPONGE LAP 18X18 X RAY DECT (DISPOSABLE) ×9 IMPLANT
STAPLER CIRC CVD 29MM 37CM (STAPLE) ×3 IMPLANT
STAPLER ECHELON LONG 60 440 (INSTRUMENTS) ×3 IMPLANT
STAPLER PROXIMATE 75MM BLUE (STAPLE) ×3 IMPLANT
STAPLER RELOAD GREEN 60MM (STAPLE) ×3
STAPLER RELOAD WHITE 60MM (STAPLE)
STAPLER VISISTAT 35W (STAPLE) ×3 IMPLANT
STRIP CLOSURE SKIN 1/2X4 (GAUZE/BANDAGES/DRESSINGS) ×3 IMPLANT
SUCTION POOLE HANDLE (INSTRUMENTS)
SUT MNCRL AB 4-0 PS2 18 (SUTURE) ×3 IMPLANT
SUT PDS AB 0 CT1 36 (SUTURE) ×9 IMPLANT
SUT PROLENE 2 0 KS (SUTURE) ×3 IMPLANT
SUT SILK 2 0 (SUTURE) ×1
SUT SILK 2 0 SH CR/8 (SUTURE) ×3 IMPLANT
SUT SILK 2-0 18XBRD TIE 12 (SUTURE) ×2 IMPLANT
SUT SILK 3 0 (SUTURE) ×1
SUT SILK 3 0 SH CR/8 (SUTURE) ×3 IMPLANT
SUT SILK 3-0 18XBRD TIE 12 (SUTURE) ×2 IMPLANT
SUT VIC AB 2-0 SH 27 (SUTURE)
SUT VIC AB 2-0 SH 27X BRD (SUTURE) IMPLANT
SUT VICRYL 0 ENDOLOOP (SUTURE) IMPLANT
SYS LAPSCP GELPORT 120MM (MISCELLANEOUS)
SYSTEM LAPSCP GELPORT 120MM (MISCELLANEOUS) IMPLANT
TAPE CLOTH 4X10 WHT NS (GAUZE/BANDAGES/DRESSINGS) ×3 IMPLANT
TOWEL OR 17X26 10 PK STRL BLUE (TOWEL DISPOSABLE) IMPLANT
TOWEL OR NON WOVEN STRL DISP B (DISPOSABLE) ×3 IMPLANT
TRAY FOLEY CATH 14FRSI W/METER (CATHETERS) ×3 IMPLANT
TRAY FOLEY W/METER SILVER 16FR (SET/KITS/TRAYS/PACK) IMPLANT
TROCAR BLADELESS OPT 5 100 (ENDOMECHANICALS) ×3 IMPLANT
TROCAR XCEL 12X100 BLDLESS (ENDOMECHANICALS) ×3 IMPLANT
TUBING INSUF HEATED (TUBING) ×3 IMPLANT

## 2016-05-11 NOTE — Anesthesia Preprocedure Evaluation (Signed)
Anesthesia Evaluation  Patient identified by MRN, date of birth, ID band Patient awake    Reviewed: Allergy & Precautions, NPO status , Patient's Chart, lab work & pertinent test results  Airway Mallampati: III  TM Distance: >3 FB Neck ROM: Full    Dental no notable dental hx. (+) Teeth Intact   Pulmonary neg pulmonary ROS,    Pulmonary exam normal breath sounds clear to auscultation       Cardiovascular negative cardio ROS Normal cardiovascular exam Rhythm:Regular Rate:Normal     Neuro/Psych  Headaches, PSYCHIATRIC DISORDERS Depression  Neuromuscular disease    GI/Hepatic Neg liver ROS, Hx/o [place] Ca of descending colon S/P colon resection and colostomy   Endo/Other  diabetes, Well Controlled, Type 2, Oral Hypoglycemic Agents  Renal/GU negative Renal ROS  negative genitourinary   Musculoskeletal   Abdominal   Peds  Hematology  (+) anemia , Thrombocytopenia- mild Hx/o Hodgkin's lymphoma   Anesthesia Other Findings   Reproductive/Obstetrics negative OB ROS                             Lab Results  Component Value Date   WBC 7.1 05/09/2016   HGB 10.5 (L) 05/09/2016   HCT 30.6 (L) 05/09/2016   MCV 85.7 05/09/2016   PLT 123 (L) 05/09/2016     Chemistry      Component Value Date/Time   NA 143 05/09/2016 1349   NA 140 04/28/2016 0830   K 3.3 (L) 05/09/2016 1349   K 3.2 (L) 04/28/2016 0830   CL 107 05/09/2016 1349   CO2 28 05/09/2016 1349   CO2 21 (L) 04/28/2016 0830   BUN 9 05/09/2016 1349   BUN 11.0 04/28/2016 0830   CREATININE 0.68 05/09/2016 1349   CREATININE 0.7 04/28/2016 0830      Component Value Date/Time   CALCIUM 9.3 05/09/2016 1349   CALCIUM 9.1 04/28/2016 0830   ALKPHOS 90 04/28/2016 0830   AST 20 04/28/2016 0830   ALT 10 04/28/2016 0830   BILITOT 0.84 04/28/2016 0830     . Anesthesia Physical Anesthesia Plan  ASA: II  Anesthesia Plan: General   Post-op  Pain Management:    Induction: Intravenous  Airway Management Planned: Oral ETT  Additional Equipment:   Intra-op Plan:   Post-operative Plan: Extubation in OR  Informed Consent: I have reviewed the patients History and Physical, chart, labs and discussed the procedure including the risks, benefits and alternatives for the proposed anesthesia with the patient or authorized representative who has indicated his/her understanding and acceptance.   Dental advisory given  Plan Discussed with: Anesthesiologist, CRNA and Surgeon  Anesthesia Plan Comments:         Anesthesia Quick Evaluation

## 2016-05-11 NOTE — Anesthesia Procedure Notes (Addendum)
Procedure Name: Intubation Date/Time: 05/11/2016 2:15 PM Performed by: West Pugh Pre-anesthesia Checklist: Patient identified, Emergency Drugs available, Suction available, Patient being monitored and Timeout performed Patient Re-evaluated:Patient Re-evaluated prior to inductionOxygen Delivery Method: Circle system utilized Preoxygenation: Pre-oxygenation with 100% oxygen Intubation Type: IV induction Ventilation: Mask ventilation without difficulty Laryngoscope Size: Glidescope and 3 Grade View: Grade I Tube type: Oral Tube size: 7.0 mm Number of attempts: 2 Airway Equipment and Method: Video-laryngoscopy Placement Confirmation: ETT inserted through vocal cords under direct vision,  positive ETCO2 and breath sounds checked- equal and bilateral Secured at: 21 cm Tube secured with: Tape Dental Injury: Teeth and Oropharynx as per pre-operative assessment  Comments: Attempt x1 by SRNA will Miller 3 w/ grade 3 view. Converted to Glidescope intubation with grade 1 view and atraumatic placement of ETT. BBS, +EtCO2. Pt mask ventilated between intubation attempts.

## 2016-05-11 NOTE — H&P (Signed)
Taylor Whitney is an 45 y.o. female.   Chief Complaint: colostomy HPI: 45 yo female with colon cancer underwent partial colectomy with end colostomy. She has now completed chemo therapy and presents for reversal of colostomy.  Past Medical History:  Diagnosis Date  . Adenocarcinoma of descending colon ([place]) 12/2015   Flex sigmoidoscopy biopsy of descending colon mass is adenocarcinoma/notes 01/18/2016  . Anemia   . Cancer [facility])    Hodgkin's lymphoma- Dr. Burr Medico  . Depression   . Family history of kidney cancer   . Hodgkin lymphoma ([place]) dx'd 01/1995  . Migraine    "maybe once/month" (01/18/2016)  . Type II diabetes mellitus ([place]) 06/2015    Past Surgical History:  Procedure Laterality Date  . COLON RESECTION N/A 01/18/2016   Procedure: OPEN PARTIAL COLECTOMY WITH END COLOSTOMY;  Surgeon: Mickeal Skinner, MD;  Location: [facility];  Service: General;  Laterality: N/A;  . COLOSTOMY  01/18/2016  . DILATION AND CURETTAGE OF UTERUS    . FLEXIBLE SIGMOIDOSCOPY N/A 01/17/2016   Procedure: FLEXIBLE SIGMOIDOSCOPY;  Surgeon: Ladene Artist, MD;  Location: [facility] ENDOSCOPY;  Service: Endoscopy;  Laterality: N/A;  . LYMPH NODE BIOPSY  1996  . MANDIBLE SURGERY  1988  . PARTIAL COLECTOMY  01/18/2016    Family History  Problem Relation Age of Onset  . Diabetes Mother   . Cancer Father     prostate cancer  . Cancer Paternal Uncle     renal cancer ; smoker  . Diabetes Maternal Grandmother   . Diabetes Maternal Grandfather   . Clotting disorder Paternal Grandmother   . COPD Paternal Grandfather    Social History:  reports that she has never smoked. She has never used smokeless tobacco. She reports that she drinks alcohol. She reports that she does not use drugs.  Allergies:  Allergies  Allergen Reactions  . Sulfa Antibiotics Rash    Medications Prior to Admission  Medication Sig Dispense Refill  . capecitabine (XELODA) 500 MG tablet Take on days 1-14 of chemotherapy. (Patient taking  differently: Take 1,500-2,000 mg by mouth See admin instructions. Take on days 1-14 of chemotherapy. Take 2000mg  in am and 1500mg  in pm for 14 days.) 98 tablet 3  . ferrous sulfate 325 (65 FE) MG tablet Take 325 mg by mouth daily with breakfast.    . metFORMIN (GLUCOPHAGE-XR) 500 MG 24 hr tablet Take 500 mg by mouth 2 (two) times daily.  0  . ondansetron (ZOFRAN) 8 MG tablet Take 1 tablet (8 mg total) by mouth 2 (two) times daily as needed for refractory nausea / vomiting. Start on day 3 after chemotherapy. 30 tablet 1  . potassium chloride SA (K-DUR,KLOR-CON) 20 MEQ tablet Take 1 tablet (20 mEq total) by mouth daily. 30 tablet 0  . prochlorperazine (COMPAZINE) 10 MG tablet Take 1 tablet (10 mg total) by mouth every 6 (six) hours as needed (Nausea or vomiting). 30 tablet 1  . venlafaxine XR (EFFEXOR-XR) 75 MG 24 hr capsule Take 225 mg by mouth at bedtime.  5    Results for orders placed or performed during the hospital encounter of 05/11/16 (from the past 48 hour(s))  Glucose, capillary     Status: None   Collection Time: 05/11/16 11:19 AM  Result Value Ref Range   Glucose-Capillary 93 65 - 99 mg/dL   Comment 1 Notify RN    Comment 2 Document in Chart    No results found.  Review of Systems  Constitutional: Negative for chills  and fever.  HENT: Negative for hearing loss.   Eyes: Negative for blurred vision and double vision.  Respiratory: Negative for cough and hemoptysis.   Cardiovascular: Negative for chest pain and palpitations.  Gastrointestinal: Negative for abdominal pain, nausea and vomiting.  Genitourinary: Negative for dysuria and urgency.  Musculoskeletal: Negative for myalgias and neck pain.  Skin: Negative for itching and rash.  Neurological: Negative for dizziness, tingling and headaches.  Endo/Heme/Allergies: Does not bruise/bleed easily.  Psychiatric/Behavioral: Negative for depression and suicidal ideas.    Blood pressure (!) 142/88, pulse 82, temperature 97.8 F  (36.6 C), temperature source Oral, resp. rate 18, height 5' 4.5" (1.638 m), weight 70.3 kg (155 lb), last menstrual period 05/08/2016, SpO2 100 %. Physical Exam  Nursing note and vitals reviewed. Constitutional: She is oriented to person, place, and time. She appears well-developed and well-nourished.  HENT:  Head: Normocephalic and atraumatic.  Eyes: Conjunctivae and EOM are normal. No scleral icterus.  Neck: Normal range of motion. Neck supple.  Cardiovascular: Normal rate and regular rhythm.   Respiratory: Effort normal and breath sounds normal. She has no wheezes. She has no rales. She exhibits no tenderness.  GI: Soft. She exhibits no distension. There is no tenderness. There is no rebound.  Colostomy present  Musculoskeletal: Normal range of motion. She exhibits no edema.  Neurological: She is alert and oriented to person, place, and time.  Skin: Skin is warm and dry.  Psychiatric: She has a normal mood and affect. Her behavior is normal.     Assessment/Plan 45 yo female with colon cancer -lap colostomy reversal with colo-rectostomy -inpatient admission  Mickeal Skinner, MD 05/11/2016, 1:59 PM

## 2016-05-11 NOTE — Op Note (Signed)
Preoperative diagnosis: colon cancer with colostomy  Postoperative diagnosis: same   Procedure: laparoscopic converted to open colostomy reversal with colo-rectostomy Surgeon: Gurney Maxin, M.D.  Asst: Servando Snare  Anesthesia: general  Indications for procedure: Taylor Whitney is a 45 y.o. year old female with colon cancer s/p partial colectomy with end colostomy for near obstructing mass. She now presents for reversal.  Description of procedure: The patient was brought into the operative suite. Anesthesia was administered with General endotracheal anesthesia. WHO checklist was applied. The patient was then placed in lithotomy position. The area was prepped and draped in the usual sterile fashion.  Next, a transverse left subcostal incision was made and a 5 mm optical entry trocar was used to gain access to the abdominal cavity.  Pneumoperitoneum was planned with a high flow and low pressure.  Lap scope was inserted to confirm placement. Upon initial inspection of the abdominal cavity there were multiple adhesions of small bowel to the abdominal wall as well as omentum to the abdominal wall throughout the mid abdominal cavity. 2 additional 5 mm trochars placed in the left mid and left lower abdomen.  Sharp and blunt dissection was used to free filmy adhesions of the left abdomen.  This allowed visualization of the transverse colon going up to the colostomy site.    Once the majority of the colostomy area was free of adhesions, the colostomy site was taken down with cautery and freed up using blunt dissection and finger 60 away from the fascia.  The skin of the colostomy was removed and a temporary closure of the colostomy was performed with a 3-0 silk in running fashion.  He colon was placed back in the abdomen and a self-retaining Alexis wound protector was placed in the space and converted to a 12 mm trocar.  Further blunt dissection was performed laparoscopically freeing up the  majority of the mid abdomen.  Next, attention was turned to the pelvis to visualize the sigmoid and rectum area.  Minimal adhesions found in the pelvis.  True rectum was identified by splaying of the tinea.  Dissection of the mesentery up to this location was performed with Harmonic scalpel.  The transverse colon/colostomy area was then look to see if it was mobile enough to be brought down into the pelvis.  There are multiple adhesions of the omentum to this area and the omental adhesions to the small bowel in the midabdomen appeared to obstruct the pathway of the colon.  Therefore decision was made to open previous laparotomy incision was reopened with sharp dissection and cautery.  Fascia was entered without injury to the abdominal contents.  Balfour retractor was put in place.  Cautery and sharp dissection was used to free the transverse colon from the surrounding omental adhesions. The small intestine was also dissected free of the omental adhesions in the midabdomen.  This allowed for the transverse colon come down easily into the pelvis.  Next a pursestring device was used on the distal transverse colon with a 0 Prolene.  The excess was removed as there was a serosal tear in the last 3 cm of the colon.  A 29 mm anvil was placed into the transverse colon an pursestring used to suture in place.  A 60 mm green load echelon stapler was then used to divide the distal sigmoid at previously dissected area.  Next, the anus and rectum was inspected from below and multiple firm stools are removed. 29 mm stapler was then easily advanced through the  rectum and up to the staple line. Anastomosis was performed.  Both donuts were inspected and complete.  Leak test was performed with rigid proctoscope, and no leak was detected.   Colorectal clean change over was performed. Copious amount of irrigation as used to clean out the abdominal cavity. Next the colostomy fascial site was closed with a running 0 PDS. The midline  was closed with 0 PDS in running fashion.  Midline incision was closed with staples at the skin.  Laparoscopic incisions were closed with 4-0 Monocryl septic or stitch.  Colostomy site was packed with saline and gauze. Patient from anesthesia and brought to PACU in stable condition.  All counts were correct.  Findings: no air leak  Specimen: distal colon  Implant: none   Blood loss: 19ml  Local anesthesia: 50 ml Exparel:Marcaine mix  Complications: none  Gurney Maxin, M.D. General, Bariatric, & Minimally Invasive Surgery [facility]

## 2016-05-11 NOTE — Anesthesia Postprocedure Evaluation (Signed)
Anesthesia Post Note  Patient: Taylor Whitney  Procedure(s) Performed: Procedure(s) (LRB): LAPAROSCOPIC CONVERTED TO OPEN REVERSAL OF END COLOSTOMY (N/A) PROCTOSCOPY  Patient location during evaluation: PACU Anesthesia Type: General Level of consciousness: awake and alert and oriented Pain management: pain level controlled Vital Signs Assessment: post-procedure vital signs reviewed and stable Respiratory status: spontaneous breathing, nonlabored ventilation, respiratory function stable and patient connected to nasal cannula oxygen Cardiovascular status: blood pressure returned to baseline and stable Postop Assessment: no signs of nausea or vomiting Anesthetic complications: no    Last Vitals:  Vitals:   05/11/16 1900 05/11/16 1915  BP: 128/79 119/77  Pulse: 99 94  Resp: 18 16  Temp:      Last Pain:  Vitals:   05/11/16 1842  TempSrc:   PainSc: Asleep                 Tabbetha Kutscher A.

## 2016-05-11 NOTE — Transfer of Care (Signed)
Immediate Anesthesia Transfer of Care Note  Patient: Taylor Whitney  Procedure(s) Performed: Procedure(s): LAPAROSCOPIC CONVERTED TO OPEN REVERSAL OF END COLOSTOMY (N/A) PROCTOSCOPY  Patient Location: PACU  Anesthesia Type:General  Level of Consciousness: awake, alert  and oriented  Airway & Oxygen Therapy: Patient Spontanous Breathing  Post-op Assessment: Report given to RN and Post -op Vital signs reviewed and stable  Post vital signs: Reviewed and stable  Last Vitals:  Vitals:   05/11/16 1122 05/11/16 1842  BP: (!) 142/88 135/84  Pulse: 82   Resp: 18   Temp: 36.6 C     Last Pain:  Vitals:   05/11/16 1242  TempSrc:   PainSc: 0-No pain      Patients Stated Pain Goal: 4 ([id number])  Complications: No apparent anesthesia complications

## 2016-05-12 ENCOUNTER — Telehealth: Payer: Self-pay | Admitting: *Deleted

## 2016-05-12 ENCOUNTER — Encounter (HOSPITAL_COMMUNITY): Payer: Self-pay | Admitting: General Surgery

## 2016-05-12 LAB — BASIC METABOLIC PANEL
ANION GAP: 8 (ref 5–15)
BUN: 7 mg/dL (ref 6–20)
CALCIUM: 8.2 mg/dL — AB (ref 8.9–10.3)
CO2: 28 mmol/L (ref 22–32)
Chloride: 104 mmol/L (ref 101–111)
Creatinine, Ser: 0.67 mg/dL (ref 0.44–1.00)
Glucose, Bld: 172 mg/dL — ABNORMAL HIGH (ref 65–99)
Potassium: 3 mmol/L — ABNORMAL LOW (ref 3.5–5.1)
SODIUM: 140 mmol/L (ref 135–145)

## 2016-05-12 LAB — CBC
HCT: 26.6 % — ABNORMAL LOW (ref 36.0–46.0)
Hemoglobin: 9.2 g/dL — ABNORMAL LOW (ref 12.0–15.0)
MCH: 30.1 pg (ref 26.0–34.0)
MCHC: 34.6 g/dL (ref 30.0–36.0)
MCV: 86.9 fL (ref 78.0–100.0)
PLATELETS: 109 10*3/uL — AB (ref 150–400)
RBC: 3.06 MIL/uL — ABNORMAL LOW (ref 3.87–5.11)
RDW: 25.7 % — ABNORMAL HIGH (ref 11.5–15.5)
WBC: 8.7 10*3/uL (ref 4.0–10.5)

## 2016-05-12 NOTE — Telephone Encounter (Signed)
Pharmacy called stating that patient needs a refill for her capcitabine.

## 2016-05-12 NOTE — Progress Notes (Signed)
Patient stood up and ambulated in room but felt nauseous and dizzy.  So did not walk in the hall.

## 2016-05-13 LAB — GLUCOSE, CAPILLARY
GLUCOSE-CAPILLARY: 121 mg/dL — AB (ref 65–99)
GLUCOSE-CAPILLARY: 105 mg/dL — AB (ref 65–99)
GLUCOSE-CAPILLARY: 135 mg/dL — AB (ref 65–99)

## 2016-05-13 LAB — CBC
HCT: 27.2 % — ABNORMAL LOW (ref 36.0–46.0)
Hemoglobin: 9.4 g/dL — ABNORMAL LOW (ref 12.0–15.0)
MCH: 30.5 pg (ref 26.0–34.0)
MCHC: 34.6 g/dL (ref 30.0–36.0)
MCV: 88.3 fL (ref 78.0–100.0)
PLATELETS: 127 10*3/uL — AB (ref 150–400)
RBC: 3.08 MIL/uL — AB (ref 3.87–5.11)
RDW: 26.7 % — ABNORMAL HIGH (ref 11.5–15.5)
WBC: 10.6 10*3/uL — AB (ref 4.0–10.5)

## 2016-05-13 LAB — BASIC METABOLIC PANEL
Anion gap: 7 (ref 5–15)
BUN: 6 mg/dL (ref 6–20)
CO2: 27 mmol/L (ref 22–32)
Calcium: 7.9 mg/dL — ABNORMAL LOW (ref 8.9–10.3)
Chloride: 103 mmol/L (ref 101–111)
Creatinine, Ser: 0.47 mg/dL (ref 0.44–1.00)
Glucose, Bld: 146 mg/dL — ABNORMAL HIGH (ref 65–99)
POTASSIUM: 3 mmol/L — AB (ref 3.5–5.1)
SODIUM: 137 mmol/L (ref 135–145)

## 2016-05-13 MED ORDER — LIP MEDEX EX OINT
1.0000 "application " | TOPICAL_OINTMENT | Freq: Two times a day (BID) | CUTANEOUS | Status: DC
  Administered 2016-05-13 – 2016-05-14 (×3): 1 via TOPICAL
  Filled 2016-05-13: qty 7

## 2016-05-13 MED ORDER — INSULIN ASPART 100 UNIT/ML ~~LOC~~ SOLN: 0.0000 [IU] | Freq: Three times a day (TID) | SUBCUTANEOUS | Status: DC

## 2016-05-13 MED ORDER — FERROUS SULFATE 325 (65 FE) MG PO TABS
325.0000 mg | ORAL_TABLET | Freq: Every day | ORAL | Status: DC
  Administered 2016-05-14: 325 mg via ORAL
  Filled 2016-05-13: qty 1

## 2016-05-13 MED ORDER — SODIUM CHLORIDE 0.9 % IV SOLN: 250.0000 mL | INTRAVENOUS | Status: DC | PRN

## 2016-05-13 MED ORDER — METOPROLOL TARTRATE 12.5 MG HALF TABLET: 12.5000 mg | ORAL_TABLET | Freq: Two times a day (BID) | ORAL | Status: DC | PRN

## 2016-05-13 MED ORDER — MENTHOL 3 MG MT LOZG: 1.0000 | LOZENGE | OROMUCOSAL | Status: DC | PRN

## 2016-05-13 MED ORDER — PHENOL 1.4 % MT LIQD: 2.0000 | OROMUCOSAL | Status: DC | PRN

## 2016-05-13 MED ORDER — ONDANSETRON HCL 4 MG PO TABS: 8.0000 mg | ORAL_TABLET | Freq: Two times a day (BID) | ORAL | Status: DC | PRN

## 2016-05-13 MED ORDER — INSULIN ASPART 100 UNIT/ML ~~LOC~~ SOLN: 0.0000 [IU] | Freq: Every day | SUBCUTANEOUS | Status: DC

## 2016-05-13 MED ORDER — VITAMIN C 500 MG PO TABS
500.0000 mg | ORAL_TABLET | Freq: Two times a day (BID) | ORAL | Status: DC
  Administered 2016-05-13 – 2016-05-14 (×4): 500 mg via ORAL
  Filled 2016-05-13 (×4): qty 1

## 2016-05-13 MED ORDER — ALUM & MAG HYDROXIDE-SIMETH 200-200-20 MG/5ML PO SUSP: 30.0000 mL | Freq: Four times a day (QID) | ORAL | Status: DC | PRN

## 2016-05-13 MED ORDER — METFORMIN HCL ER 500 MG PO TB24
500.0000 mg | ORAL_TABLET | Freq: Two times a day (BID) | ORAL | Status: DC
  Administered 2016-05-13 – 2016-05-14 (×3): 500 mg via ORAL
  Filled 2016-05-13 (×3): qty 1

## 2016-05-13 MED ORDER — LACTATED RINGERS IV BOLUS (SEPSIS): 1000.0000 mL | Freq: Three times a day (TID) | INTRAVENOUS | Status: AC | PRN

## 2016-05-13 MED ORDER — SODIUM CHLORIDE 0.9% FLUSH
3.0000 mL | Freq: Two times a day (BID) | INTRAVENOUS | Status: DC
  Administered 2016-05-13 – 2016-05-14 (×4): 3 mL via INTRAVENOUS

## 2016-05-13 MED ORDER — SODIUM CHLORIDE 0.9% FLUSH: 3.0000 mL | INTRAVENOUS | Status: DC | PRN

## 2016-05-13 MED ORDER — MAGIC MOUTHWASH: 15.0000 mL | Freq: Four times a day (QID) | ORAL | Status: DC | PRN

## 2016-05-13 MED ORDER — PROCHLORPERAZINE EDISYLATE 5 MG/ML IJ SOLN: 5.0000 mg | INTRAMUSCULAR | Status: DC | PRN

## 2016-05-13 MED ORDER — HYDROMORPHONE HCL 2 MG/ML IJ SOLN
0.5000 mg | INTRAMUSCULAR | Status: DC | PRN
Start: 2016-05-13 — End: 2016-05-15

## 2016-05-13 MED ORDER — ACETAMINOPHEN 500 MG PO TABS
1000.0000 mg | ORAL_TABLET | Freq: Three times a day (TID) | ORAL | Status: DC
  Administered 2016-05-13 – 2016-05-14 (×5): 1000 mg via ORAL
  Filled 2016-05-13 (×5): qty 2

## 2016-05-13 MED ORDER — METHOCARBAMOL 500 MG PO TABS: 1000.0000 mg | ORAL_TABLET | Freq: Four times a day (QID) | ORAL | Status: DC | PRN

## 2016-05-13 MED ORDER — PROCHLORPERAZINE MALEATE 10 MG PO TABS
10.0000 mg | ORAL_TABLET | Freq: Four times a day (QID) | ORAL | Status: DC | PRN
  Filled 2016-05-13: qty 1

## 2016-05-13 MED ORDER — METOPROLOL TARTRATE 5 MG/5ML IV SOLN: 5.0000 mg | Freq: Four times a day (QID) | INTRAVENOUS | Status: DC | PRN

## 2016-05-13 MED ORDER — ADULT MULTIVITAMIN W/MINERALS CH
1.0000 | ORAL_TABLET | Freq: Every day | ORAL | Status: DC
  Administered 2016-05-13 – 2016-05-14 (×2): 1 via ORAL
  Filled 2016-05-13 (×2): qty 1

## 2016-05-13 MED ORDER — VENLAFAXINE HCL ER 75 MG PO CP24
225.0000 mg | ORAL_CAPSULE | Freq: Every day | ORAL | Status: DC
  Administered 2016-05-13 – 2016-05-14 (×2): 225 mg via ORAL
  Filled 2016-05-13 (×2): qty 1

## 2016-05-13 MED ORDER — METHOCARBAMOL 1000 MG/10ML IJ SOLN: 1000.0000 mg | Freq: Four times a day (QID) | INTRAMUSCULAR | Status: DC | PRN

## 2016-05-13 NOTE — Progress Notes (Signed)
[facility]  [address] [place], [place] [address] Phone: [phone] FAX: [phone]   Taylor Whitney [id number] 1971-01-04    Problem List:   Principal Problem:   Cancer of left colon s/p colostomy takedown 05/11/2016 Active Problems:   Large bowel obstruction s/p colectomy/colostomy Aug 2017   Depression   Diabetes mellitus with complication (HCC)   Iron deficiency anemia   2 Days Post-Op  05/11/2016  Preoperative diagnosis: colon cancer with colostomy  Postoperative diagnosis: same   Procedure: laparoscopic converted to open colostomy reversal with colo-rectostomy Surgeon: Gurney Maxin, M.D.  Asst: Servando Snare   Assessment  Recovering  Plan:  -adv diet gradually -DM control - restart metformin with SSI -improve pain control -VTE prophylaxis- SCDs, etc -mobilize as tolerated to help recovery  Adin Hector, M.D., F.A.C.S. Gastrointestinal and Minimally Invasive Surgery [facility] [address], [place], [place] [address] [phone] Main / Paging   05/13/2016  CARE TEAM:  PCP: Lynne Logan, MD  Outpatient Care Team: Patient Care Team: Donald Prose, MD as PCP - General (Family Medicine) Truitt Merle, MD as Consulting Physician (Hematology) Mickeal Skinner, MD as Consulting Physician (General Surgery)  Inpatient Treatment Team: Treatment Team: Attending Provider: Arta Bruce Kinsinger, MD; Registered Nurse: Arnold Long, RN; Technician: Abbe Amsterdam, NT  Subjective:  Sore Family in room  Objective:  Vital signs:  Vitals:   05/12/16 1737 05/12/16 1838 05/12/16 2100 05/13/16 0600  BP: 112/67  (!) 100/55 116/72  Pulse: (!) 108  98 99  Resp:   16 18  Temp: 98.9 F (37.2 C) (!) 100.5 F (38.1 C) 98.4 F (36.9 C) 98.3 F (36.8 C)  TempSrc: Oral Oral Oral Oral  SpO2: 97%  93% 96%  Weight:      Height:           Intake/Output    Yesterday:  12/15 0701 - 12/16 0700 In: 7588 [I.V.:1650] Out: 1975 [[id number]:[id number]] This shift:  No intake/output data recorded.  Bowel function:  Flatus: No  BM:  No  Drain: (No drain)   Physical Exam:  General: Pt awake/alert/oriented x4 in No acute distress.  Smiling Eyes: PERRL, normal EOM.  Sclera clear.  No icterus Neuro: CN II-XII intact w/o focal sensory/motor deficits. Lymph: No head/neck/groin lymphadenopathy Psych:  No delerium/psychosis/paranoia HENT: Normocephalic, Mucus membranes moist.  No thrush Neck: Supple, No tracheal deviation Chest: No chest wall pain w good excursion CV:  Pulses intact.  Regular rhythm MS: Normal AROM mjr joints.  No obvious deformity Abdomen: Soft.  Mildy distended.  Mildly tender at incisions only.  No evidence of peritonitis.  No incarcerated hernias. Ext:  SCDs BLE.  No mjr edema.  No cyanosis Skin: No petechiae / purpura  Results:   Labs: Results for orders placed or performed during the hospital encounter of 05/11/16 (from the past 48 hour(s))  Glucose, capillary     Status: None   Collection Time: 05/11/16 11:19 AM  Result Value Ref Range   Glucose-Capillary 93 65 - 99 mg/dL   Comment 1 Notify RN    Comment 2 Document in Chart   Glucose, capillary     Status: Abnormal   Collection Time: 05/11/16  4:48 PM  Result Value Ref Range   Glucose-Capillary 142 (H) 65 - 99 mg/dL  Glucose, capillary     Status: Abnormal   Collection Time: 05/11/16  6:55 PM  Result Value Ref Range  Glucose-Capillary 160 (H) 65 - 99 mg/dL  CBC     Status: Abnormal   Collection Time: 05/11/16  9:09 PM  Result Value Ref Range   WBC 8.4 4.0 - 10.5 K/uL   RBC 3.16 (L) 3.87 - 5.11 MIL/uL   Hemoglobin 9.4 (L) 12.0 - 15.0 g/dL   HCT 27.5 (L) 36.0 - 46.0 %   MCV 87.0 78.0 - 100.0 fL   MCH 29.7 26.0 - 34.0 pg   MCHC 34.2 30.0 - 36.0 g/dL   RDW 25.5 (H) 11.5 - 15.5 %   Platelets 109 (L) 150 - 400 K/uL    Comment: SPECIMEN CHECKED FOR CLOTS REPEATED  TO VERIFY PLATELET COUNT CONFIRMED BY SMEAR   Creatinine, serum     Status: None   Collection Time: 05/11/16  9:09 PM  Result Value Ref Range   Creatinine, Ser 0.71 0.44 - 1.00 mg/dL   GFR calc non Af Amer >60 >60 mL/min   GFR calc Af Amer >60 >60 mL/min    Comment: (NOTE) The eGFR has been calculated using the CKD EPI equation. This calculation has not been validated in all clinical situations. eGFR's persistently <60 mL/min signify possible Chronic Kidney Disease. Performed at [facility]   CBC     Status: Abnormal   Collection Time: 05/12/16  5:14 AM  Result Value Ref Range   WBC 8.7 4.0 - 10.5 K/uL   RBC 3.06 (L) 3.87 - 5.11 MIL/uL   Hemoglobin 9.2 (L) 12.0 - 15.0 g/dL   HCT 26.6 (L) 36.0 - 46.0 %   MCV 86.9 78.0 - 100.0 fL   MCH 30.1 26.0 - 34.0 pg   MCHC 34.6 30.0 - 36.0 g/dL   RDW 25.7 (H) 11.5 - 15.5 %   Platelets 109 (L) 150 - 400 K/uL    Comment: CONSISTENT WITH PREVIOUS RESULT  Basic metabolic panel     Status: Abnormal   Collection Time: 05/12/16  5:14 AM  Result Value Ref Range   Sodium 140 135 - 145 mmol/L   Potassium 3.0 (L) 3.5 - 5.1 mmol/L   Chloride 104 101 - 111 mmol/L   CO2 28 22 - 32 mmol/L   Glucose, Bld 172 (H) 65 - 99 mg/dL   BUN 7 6 - 20 mg/dL   Creatinine, Ser 0.67 0.44 - 1.00 mg/dL   Calcium 8.2 (L) 8.9 - 10.3 mg/dL   GFR calc non Af Amer >60 >60 mL/min   GFR calc Af Amer >60 >60 mL/min    Comment: (NOTE) The eGFR has been calculated using the CKD EPI equation. This calculation has not been validated in all clinical situations. eGFR's persistently <60 mL/min signify possible Chronic Kidney Disease.    Anion gap 8 5 - 15  CBC     Status: Abnormal   Collection Time: 05/13/16  4:47 AM  Result Value Ref Range   WBC 10.6 (H) 4.0 - 10.5 K/uL   RBC 3.08 (L) 3.87 - 5.11 MIL/uL   Hemoglobin 9.4 (L) 12.0 - 15.0 g/dL   HCT 27.2 (L) 36.0 - 46.0 %   MCV 88.3 78.0 - 100.0 fL   MCH 30.5 26.0 - 34.0 pg   MCHC 34.6 30.0 - 36.0 g/dL   RDW  26.7 (H) 11.5 - 15.5 %   Platelets 127 (L) 150 - 400 K/uL  Basic metabolic panel     Status: Abnormal   Collection Time: 05/13/16  4:47 AM  Result Value Ref Range   Sodium 137  135 - 145 mmol/L   Potassium 3.0 (L) 3.5 - 5.1 mmol/L   Chloride 103 101 - 111 mmol/L   CO2 27 22 - 32 mmol/L   Glucose, Bld 146 (H) 65 - 99 mg/dL   BUN 6 6 - 20 mg/dL   Creatinine, Ser 0.47 0.44 - 1.00 mg/dL   Calcium 7.9 (L) 8.9 - 10.3 mg/dL   GFR calc non Af Amer >60 >60 mL/min   GFR calc Af Amer >60 >60 mL/min    Comment: (NOTE) The eGFR has been calculated using the CKD EPI equation. This calculation has not been validated in all clinical situations. eGFR's persistently <60 mL/min signify possible Chronic Kidney Disease.    Anion gap 7 5 - 15    Imaging / Studies: No results found.  Medications / Allergies: per chart  Antibiotics: Anti-infectives    Start     Dose/Rate Route Frequency Ordered Stop   05/11/16 1121  ertapenem (INVANZ) 1 g in sodium chloride 0.9 % 50 mL IVPB     1 g 100 mL/hr over 30 Minutes Intravenous On call to O.R. 05/11/16 1121 05/11/16 1417        Note: Portions of this report may have been transcribed using voice recognition software. Every effort was made to ensure accuracy; however, inadvertent computerized transcription errors may be present.   Any transcriptional errors that result from this process are unintentional.     Adin Hector, M.D., F.A.C.S. Gastrointestinal and Minimally Invasive Surgery [facility] [address], [place], [place] [address] [phone] Main / Paging   05/13/2016

## 2016-05-14 LAB — GLUCOSE, CAPILLARY
GLUCOSE-CAPILLARY: 97 mg/dL (ref 65–99)
GLUCOSE-CAPILLARY: 96 mg/dL (ref 65–99)
GLUCOSE-CAPILLARY: 127 mg/dL — AB (ref 65–99)
Glucose-Capillary: 103 mg/dL — ABNORMAL HIGH (ref 65–99)

## 2016-05-14 MED ORDER — POTASSIUM CHLORIDE CRYS ER 20 MEQ PO TBCR
40.0000 meq | EXTENDED_RELEASE_TABLET | Freq: Two times a day (BID) | ORAL | Status: DC
  Administered 2016-05-14 (×2): 40 meq via ORAL
  Filled 2016-05-14 (×2): qty 2

## 2016-05-14 NOTE — Discharge Instructions (Signed)
SURGERY: POST OP INSTRUCTIONS °(Surgery for small bowel obstruction, colon resection, etc) ° ° °###################################################################### ° °EAT °Gradually transition to a high fiber diet with a fiber supplement over the next few days after discharge ° °WALK °Walk an hour a day.  Control your pain to do that.   ° °CONTROL PAIN °Control pain so that you can walk, sleep, tolerate sneezing/coughing, go up/down stairs. ° °HAVE A BOWEL MOVEMENT DAILY °Keep your bowels regular to avoid problems.  OK to try a laxative to override constipation.  OK to use an antidairrheal to slow down diarrhea.  Call if not better after 2 tries ° °CALL IF YOU HAVE PROBLEMS/CONCERNS °Call if you are still struggling despite following these instructions. °Call if you have concerns not answered by these instructions ° °###################################################################### ° ° °DIET °Follow a light diet the first few days at home.  Start with a bland diet such as soups, liquids, starchy foods, low fat foods, etc.  If you feel full, bloated, or constipated, stay on a ful liquid or pureed/blenderized diet for a few days until you feel better and no longer constipated. °Be sure to drink plenty of fluids every day to avoid getting dehydrated (feeling dizzy, not urinating, etc.). °Gradually add a fiber supplement to your diet over the next week.  Gradually get back to a regular solid diet.  Avoid fast food or heavy meals the first week as you are more likely to get nauseated. °It is expected for your digestive tract to need a few months to get back to normal.  It is common for your bowel movements and stools to be irregular.  You will have occasional bloating and cramping that should eventually fade away.  Until you are eating solid food normally, off all pain medications, and back to regular activities; your bowels will not be normal. °Focus on eating a low-fat, high fiber diet the rest of your life  (See Getting to Good Bowel Health, below). ° °CARE of your INCISION or WOUND °It is good for closed incision and even open wounds to be washed every day.  Shower every day.  Short baths are fine.  Wash the incisions and wounds clean with soap & water.    °If you have a closed incision(s), wash the incision with soap & water every day.  You may leave closed incisions open to air if it is dry.   You may cover the incision with clean gauze & replace it after your daily shower for comfort. °If you have skin tapes (Steristrips) or skin glue (Dermabond) on your incision, leave them in place.  They will fall off on their own like a scab.  You may trim any edges that curl up with clean scissors.  If you have staples, set up an appointment for them to be removed in the office in 10 days after surgery.  °If you have a drain, wash around the skin exit site with soap & water and place a new dressing of gauze or band aid around the skin every day.  Keep the drain site clean & dry.    °If you have an open wound with packing, see wound care instructions.  In general, it is encouraged that you remove your dressing and packing, shower with soap & water, and replace your dressing once a day.  Pack the wound with clean gauze moistened with normal (0.9%) saline to keep the wound moist & uninfected.  Pressure on the dressing for 30 minutes will stop most wound   bleeding.  Eventually your body will heal & pull the open wound closed over the next few months.  °Raw open wounds will occasionally bleed or secrete yellow drainage until it heals closed.  Drain sites will drain a little until the drain is removed.  Even closed incisions can have mild bleeding or drainage the first few days until the skin edges scab over & seal.   °If you have an open wound with a wound vac, see wound vac care instructions. ° ° ° ° °ACTIVITIES as tolerated °Start light daily activities --- self-care, walking, climbing stairs-- beginning the day after surgery.   Gradually increase activities as tolerated.  Control your pain to be active.  Stop when you are tired.  Ideally, walk several times a day, eventually an hour a day.   °Most people are back to most day-to-day activities in a few weeks.  It takes 4-8 weeks to get back to unrestricted, intense activity. °If you can walk 30 minutes without difficulty, it is safe to try more intense activity such as jogging, treadmill, bicycling, low-impact aerobics, swimming, etc. °Save the most intensive and strenuous activity for last (Usually 4-8 weeks after surgery) such as sit-ups, heavy lifting, contact sports, etc.  Refrain from any intense heavy lifting or straining until you are off narcotics for pain control.  You will have off days, but things should improve week-by-week. °DO NOT PUSH THROUGH PAIN.  Let pain be your guide: If it hurts to do something, don't do it.  Pain is your body warning you to avoid that activity for another week until the pain goes down. °You may drive when you are no longer taking narcotic prescription pain medication, you can comfortably wear a seatbelt, and you can safely make sudden turns/stops to protect yourself without hesitating due to pain. °You may have sexual intercourse when it is comfortable. If it hurts to do something, stop. ° °MEDICATIONS °Take your usually prescribed home medications unless otherwise directed.   °Blood thinners:  °Usually you can restart any strong blood thinners after the second postoperative day.  It is OK to take aspirin right away.    ° If you are on strong blood thinners (warfarin/Coumadin, Plavix, Xerelto, Eliquis, Pradaxa, etc), discuss with your surgeon, medicine PCP, and/or cardiologist for instructions on when to restart the blood thinner & if blood monitoring is needed (PT/INR blood check, etc).   ° ° °PAIN CONTROL °Pain after surgery or related to activity is often due to strain/injury to muscle, tendon, nerves and/or incisions.  This pain is usually  short-term and will improve in a few months.  °To help speed the process of healing and to get back to regular activity more quickly, DO THE FOLLOWING THINGS TOGETHER: °1. Increase activity gradually.  DO NOT PUSH THROUGH PAIN °2. Use Ice and/or Heat °3. Try Gentle Massage and/or Stretching °4. Take over the counter pain medication °5. Take Narcotic prescription pain medication for more severe pain ° °Good pain control = faster recovery.  It is better to take more medicine to be more active than to stay in bed all day to avoid medications. °1.  Increase activity gradually °Avoid heavy lifting at first, then increase to lifting as tolerated over the next 6 weeks. °Do not “push through” the pain.  Listen to your body and avoid positions and maneuvers than reproduce the pain.  Wait a few days before trying something more intense °Walking an hour a day is encouraged to help your body recover faster   and more safely.  Start slowly and stop when getting sore.  If you can walk 30 minutes without stopping or pain, you can try more intense activity (running, jogging, aerobics, cycling, swimming, treadmill, sex, sports, weightlifting, etc.) °Remember: If it hurts to do it, then don’t do it! °2. Use Ice and/or Heat °You will have swelling and bruising around the incisions.  This will take several weeks to resolve. °Ice packs or heating pads (6-8 times a day, 30-60 minutes at a time) will help sooth soreness & bruising. °Some people prefer to use ice alone, heat alone, or alternate between ice & heat.  Experiment and see what works best for you.  Consider trying ice for the first few days to help decrease swelling and bruising; then, switch to heat to help relax sore spots and speed recovery. °Shower every day.  Short baths are fine.  It feels good!  Keep the incisions and wounds clean with soap & water.   °3. Try Gentle Massage and/or Stretching °Massage at the area of pain many times a day °Stop if you feel pain - do not  overdo it °4. Take over the counter pain medication °This helps the muscle and nerve tissues become less irritable and calm down faster °Choose ONE of the following over-the-counter anti-inflammatory medications: °Acetaminophen 500mg tabs (Tylenol) 1-2 pills with every meal and just before bedtime (avoid if you have liver problems or if you have acetaminophen in you narcotic prescription) °Naproxen 220mg tabs (ex. Aleve, Naprosyn) 1-2 pills twice a day (avoid if you have kidney, stomach, IBD, or bleeding problems) °Ibuprofen 200mg tabs (ex. Advil, Motrin) 3-4 pills with every meal and just before bedtime (avoid if you have kidney, stomach, IBD, or bleeding problems) °Take with food/snack several times a day as directed for at least 2 weeks to help keep pain / soreness down & more manageable. °5. Take Narcotic prescription pain medication for more severe pain °A prescription for strong pain control is often given to you upon discharge (for example: oxycodone/Percocet, hydrocodone/Norco/Vicodin, or tramadol/Ultram) °Take your pain medication as prescribed. °Be mindful that most narcotic prescriptions contain Tylenol (acetaminophen) as well - avoid taking too much Tylenol. °If you are having problems/concerns with the prescription medicine (does not control pain, nausea, vomiting, rash, itching, etc.), please call us [phone] to see if we need to switch you to a different pain medicine that will work better for you and/or control your side effects better. °If you need a refill on your pain medication, you must call the office before 4 pm and on weekdays only.  By federal law, prescriptions for narcotics cannot be called into a pharmacy.  They must be filled out on paper & picked up from our office by the patient or authorized caretaker.  Prescriptions cannot be filled after 4 pm nor on weekends.   ° °WHEN TO CALL US [phone] °Severe uncontrolled or worsening pain  °Fever over 101 F (38.5 C) °Concerns with  the incision: Worsening pain, redness, rash/hives, swelling, bleeding, or drainage °Reactions / problems with new medications (itching, rash, hives, nausea, etc.) °Nausea and/or vomiting °Difficulty urinating °Difficulty breathing °Worsening fatigue, dizziness, lightheadedness, blurred vision °Other concerns °If you are not getting better after two weeks or are noticing you are getting worse, contact our office [phone] for further advice.  We may need to adjust your medications, re-evaluate you in the office, send you to the emergency room, or see what other things we can do to help. °The   clinic staff is available to answer your questions during regular business hours (8:30am-5pm).  Please don’t hesitate to call and ask to speak to one of our nurses for clinical concerns.    °A surgeon from [facility] is always on call at the hospitals 24 hours/day °If you have a medical emergency, go to the nearest emergency room or call 911. ° °FOLLOW UP in our office °One the day of your discharge from the hospital (or the next business weekday), please call [facility] to set up or confirm an appointment to see your surgeon in the office for a follow-up appointment.  Usually it is 2-3 weeks after your surgery.   °If you have skin staples at your incision(s), let the office know so we can set up a time in the office for the nurse to remove them (usually around 10 days after surgery). °Make sure that you call for appointments the day of discharge (or the next business weekday) from the hospital to ensure a convenient appointment time. °IF YOU HAVE DISABILITY OR FAMILY LEAVE FORMS, BRING THEM TO THE OFFICE FOR PROCESSING.  DO NOT GIVE THEM TO YOUR DOCTOR. ° °[facility] °[address], [address], [place],   [address] ? °[phone] - Main °[phone] - Toll Free,  [phone] - Fax °[email or web address] ° °GETTING TO GOOD BOWEL HEALTH. °It is  expected for your digestive tract to need a few months to get back to normal.  It is common for your bowel movements and stools to be irregular.  You will have occasional bloating and cramping that should eventually fade away.  Until you are eating solid food normally, off all pain medications, and back to regular activities; your bowels will not be normal.   °Avoiding constipation °The goal: ONE SOFT BOWEL MOVEMENT A DAY!    °Drink plenty of fluids.  Choose water first. °TAKE A FIBER SUPPLEMENT EVERY DAY THE REST OF YOUR LIFE °During your first week back home, gradually add back a fiber supplement every day °Experiment which form you can tolerate.   There are many forms such as powders, tablets, wafers, gummies, etc °Psyllium bran (Metamucil), methylcellulose (Citrucel), Miralax or Glycolax, Benefiber, Flax Seed.  °Adjust the dose week-by-week (1/2 dose/day to 6 doses a day) until you are moving your bowels 1-2 times a day.  Cut back the dose or try a different fiber product if it is giving you problems such as diarrhea or bloating. °Sometimes a laxative is needed to help jump-start bowels if constipated until the fiber supplement can help regulate your bowels.  If you are tolerating eating & you are farting, it is okay to try a gentle laxative such as double dose MiraLax, prune juice, or Milk of Magnesia.  Avoid using laxatives too often. °Stool softeners can sometimes help counteract the constipating effects of narcotic pain medicines.  It can also cause diarrhea, so avoid using for too long. °If you are still constipated despite taking fiber daily, eating solids, and a few doses of laxatives, call our office. °Controlling diarrhea °Try drinking liquids and eating bland foods for a few days to avoid stressing your intestines further. °Avoid dairy products (especially milk & ice cream) for a short time.  The intestines often can lose the ability to digest lactose when stressed. °Avoid foods that cause gassiness or  bloating.  Typical foods include beans and other legumes, cabbage, broccoli, and dairy foods.  Avoid greasy, spicy, fast foods.  Every person has   some sensitivity to other foods, so listen to your body and avoid those foods that trigger problems for you. Probiotics (such as active yogurt, Align, etc) may help repopulate the intestines and colon with normal bacteria and calm down a sensitive digestive tract Adding a fiber supplement gradually can help thicken stools by absorbing excess fluid and retrain the intestines to act more normally.  Slowly increase the dose over a few weeks.  Too much fiber too soon can backfire and cause cramping & bloating. It is okay to try and slow down diarrhea with a few doses of antidiarrheal medicines.   Bismuth subsalicylate (ex. Kayopectate, Pepto Bismol) for a few doses can help control diarrhea.  Avoid if pregnant.   Loperamide (Imodium) can slow down diarrhea.  Start with one tablet ('2mg'$ ) first.  Avoid if you are having fevers or severe pain.  ILEOSTOMY PATIENTS WILL HAVE CHRONIC DIARRHEA since their colon is not in use.    Drink plenty of liquids.  You will need to drink even more glasses of water/liquid a day to avoid getting dehydrated. Record output from your ileostomy.  Expect to empty the bag every 3-4 hours at first.  Most people with a permanent ileostomy empty their bag 4-6 times at the least.   Use antidiarrheal medicine (especially Imodium) several times a day to avoid getting dehydrated.  Start with a dose at bedtime & breakfast.  Adjust up or down as needed.  Increase antidiarrheal medications as directed to avoid emptying the bag more than 8 times a day (every 3 hours). Work with your wound ostomy nurse to learn care for your ostomy.  See ostomy care instructions. TROUBLESHOOTING IRREGULAR BOWELS 1) Start with a soft & bland diet. No spicy, greasy, or fried foods.  2) Avoid gluten/wheat or dairy products from diet to see if symptoms improve. 3) Miralax  17gm or flax seed mixed in [place]. water or juice-daily. May use 2-4 times a day as needed. 4) Gas-X, Phazyme, etc. as needed for gas & bloating.  5) Prilosec (omeprazole) over-the-counter as needed 6)  Consider probiotics (Align, Activa, etc) to help calm the bowels down  Call your doctor if you are getting worse or not getting better.  Sometimes further testing (cultures, endoscopy, X-ray studies, CT scans, bloodwork, etc.) may be needed to help diagnose and treat the cause of the diarrhea. [facility], [place], [place], [place], [place]  [address] [phone] - Main.    [phone]  - Toll Free.   [phone] - Fax [email or web address]  WOUND CARE  It is important that the wound be kept open.   -Keeping the skin edges apart will allow the wound to gradually heal from the base upwards.   - If the skin edges of the wound close too early, a new fluid pocket can form and infection can occur. -This is the reason to pack deeper wounds with gauze or ribbon -This is why drained wounds cannot be sewed closed right away  A healthy wound should form a lining of bright red "beefy" granulating tissue that will help shrink the wound and help the edges grow new skin into it.   -A little mucus / yellow discharge is normal (the body's natural way to try and form a scab) and should be gently washed off with soap and water with daily dressing changes.  -Green or foul smelling drainage implies bacterial colonization and can slow wound healing - a short course of antibiotic ointment (3-5 days)  can help it clear up.  Call the doctor if it does not improve or worsens  -Avoid use of antibiotic ointments for more than a week as they can slow wound healing over time.    -Sometimes other wound care products will be used to reduce need for dressing changes and/or help clean up dirty wounds -Sometimes the surgeon needs to debride the wound in the office to remove dead or  infected tissue out of the wound so it can heal more quickly and safely.    Change the dressing at least once a day -Wash the wound with mild soap and water gently every day.  It is good to shower or bathe the wound to help it clean out. -Use clean 4x4 gauze for medium/large wounds or ribbon plain NU-gauze for smaller wounds (it does not need to be sterile, just clean) -Keep the raw wound moist with a little saline or KY (saline) gel on the gauze.  -A dry wound will take longer to heal.  -Keep the skin dry around the wound to prevent breakdown and irritation. -Pack the wound down to the base -The goal is to keep the skin apart, not overpack the wound -Use a Q-tip or blunt-tipped kabob stick toothpick to push the gauze down to the base in narrow or deep wounds   -Cover with a clean gauze and tape -paper or Medipore tape tend to be gentle on the skin -rotate the orientation of the tape to avoid repeated stress/trauma on the skin -using an ACE or Coban wrap on wounds on arms or legs can be used instead.  Complete all antibiotics through the entire prescription to help the infection heal and prevent new places of infection   Returning the see the surgeon is helpful to follow the healing process and help the wound close as fast as possible.   Colorectal Cancer Colorectal cancer is an abnormal growth of tissue (tumor) in the colon or rectum that is cancerous (malignant). Unlike noncancerous (benign) tumors, malignant tumors can spread to other parts of your body. The colon is the large bowel or large intestine. The rectum is the last several inches of the colon. What increases the risk? The exact cause of colorectal cancer is unknown. However, the following factors may increase your chances of getting colorectal cancer:  Age older than 57 years.  Abnormal growths (polyps) on the inner wall of the colon or rectum.  Diabetes.  African American race.  Family history of hereditary  nonpolyposis colorectal cancer. This condition is caused by changes in the genes that are responsible for repairing mismatched DNA.  Personal history of cancer. A person who has already had colorectal cancer may develop it a second time. Also, women with a history of ovarian, uterine, or breast cancer are at a somewhat higher risk of developing colorectal cancer.  Certain hereditary conditions.  Eating a diet that is high in fat (especially animal fat) and low in fiber, fruits, and vegetables.  Sedentary lifestyle.  Inflammatory bowel disease, including ulcerative colitis and Crohn's disease.  Smoking.  Excessive alcohol use. What are the signs or symptoms? Early colorectal cancer often does not cause symptoms. As the cancer grows, symptoms may include:  Changes in bowel habits.  Diarrhea.  Constipation.  Feeling like the bowel does not empty completely after a bowel movement.  Blood in the stool.  Stools that are narrower than usual.  Abdominal discomfort, pain, bloating, fullness, or cramps.  Frequent gas pain.  Unexplained weight loss.  Constant tiredness.  Nausea and vomiting. How is this diagnosed? Your health care provider will ask about your medical history. He or she may also perform a number of procedures, such as:  A physical exam.  A digital rectal exam.  A fecal occult blood test.  A barium enema.  Blood tests.  X-rays.  Imaging tests, such as CT scans or MRIs.  Taking a tissue sample (biopsy) from your colon or rectum to look for cancer cells.  A sigmoidoscopy to view the inside of the last part of your colon.  A colonoscopy to view the inside of your entire colon.  An endorectal ultrasound to see how deep a rectal tumor has grown and whether the cancer has spread to lymph nodes or other nearby tissues. Your cancer will be staged to determine its severity and extent. Staging is a careful attempt to find out the size of the tumor, whether the  cancer has spread, and if so, to what parts of the body. You may need to have more tests to determine the stage of your cancer. The test results will help determine what treatment plan is best for you.  Stage 0. The cancer is found only in the innermost lining of the colon or rectum.  Stage I. The cancer has grown into the inner wall of the colon or rectum. The cancer has not yet reached the outer wall of the colon.  Stage II. The cancer extends more deeply into or through the wall of the colon or rectum. It may have invaded nearby tissue, but cancer cells have not spread to the lymph nodes.  Stage III. The cancer has spread to nearby lymph nodes but not to other parts of the body.  Stage IV. The cancer has spread to other parts of the body, such as the liver or lungs. Your health care provider may tell you the detailed stage of your cancer, which includes both a number and a letter. How is this treated? Depending on the type and stage, colorectal cancer may be treated with surgery, radiation therapy, chemotherapy, targeted therapy, or radiofrequency ablation. Some people have a combination of these therapies. Surgery may be done to remove the polyps from your colon. In early stages, your health care provider may be able to do this during a colonoscopy. In later stages, surgery may be done to remove part of your colon. Follow these instructions at home:  Take medicines only as directed by your health care provider.  Maintain a healthy diet.  Consider joining a support group. This may help you learn to cope with the stress of having colorectal cancer.  Seek advice to help you manage treatment of side effects.  Keep all follow-up visits as directed by your health care provider.  Inform your cancer specialist if you are admitted to the hospital. Contact a health care provider if:  Your diarrhea or constipation does not go away.  Your bowel habits change.  You have increased abdominal  pain.  You notice new fatigue or weakness.  You lose weight. This information is not intended to replace advice given to you by your health care provider. Make sure you discuss any questions you have with your health care provider. Document Released: 05/15/2005 Document Revised: 10/21/2015 Document Reviewed: 11/07/2012 Elsevier Interactive Patient Education  2017 [employer].

## 2016-05-14 NOTE — Progress Notes (Signed)
Pharmacy Brief Note - Alvimopan (Entereg)  The standing order set for alvimopan (Entereg) includes an automatic order to discontinue the drug after the patient has had a bowel movement. The change was approved by the [employer] and the Medical Executive Committee.   This patient has had bowel movements documented by nursing. Therefore, alvimopan has been discontinued. If there are questions, please contact the pharmacy at [phone].   Thank you-  Doreene Eland, PharmD, BCPS.   Pager: [phone] 05/14/2016 12:08 PM

## 2016-05-14 NOTE — Progress Notes (Signed)
Pt assisted with dressing changed demonstrating good aseptic technique.

## 2016-05-14 NOTE — Progress Notes (Addendum)
[address] [place], [place] [address] Phone: [phone] FAX: [phone]   NUALA CHILES [id number] [birth year]    Problem List:   Principal Problem:   Cancer of left colon s/p colostomy takedown 05/11/2016 Active Problems:   Large bowel obstruction s/p colectomy/colostomy Aug 2017   Depression   Diabetes mellitus with complication (HCC)   Hypokalemia   Iron deficiency anemia   3 Days Post-Op  05/11/2016  Preoperative diagnosis: colon cancer with colostomy  Postoperative diagnosis: same   Procedure: laparoscopic converted to open colostomy reversal with colo-rectostomy Surgeon: Gurney Maxin, M.D.  Asst: Servando Snare   Assessment  Recovering  Plan:  -adv diet to solids -DM control - metformin with SSI -PO pain control with IV backup -correct low K - recheck in AM -VTE prophylaxis- SCDs, etc -mobilize as tolerated to help recovery  D/C patient from hospital when patient meets criteria (anticipate in 1-2 day(s)):  Tolerating oral intake well Ambulating well Adequate pain control without IV medications Urinating  Having flatus Disposition planning in place  I updated the patient's status to the patient and family.  Recommendations were made.  Questions were answered.  They expressed understanding & appreciation.    Adin Hector, M.D., F.A.C.S. Gastrointestinal and Minimally Invasive Surgery [facility] [address] [place], [place] [address] [phone] Main / Paging   05/14/2016  CARE TEAM:  PCP: Lynne Logan, MD  Outpatient Care Team: Patient Care Team: Donald Prose, MD as PCP - General (Family Medicine) Truitt Merle, MD as Consulting Physician (Hematology) Mickeal Skinner, MD as Consulting Physician (General Surgery)  Inpatient Treatment Team: Treatment Team: Attending Provider: Arta Bruce Kinsinger, MD; Registered Nurse: Arnold Long, RN; Technician: Abbe Amsterdam, NT; Registered Nurse: Mickie Kay, RN  Subjective:  Less sore Toll fulls, wants solids Walking in hallways Family in room  Objective:  Vital signs:  Vitals:   05/13/16 0600 05/13/16 1400 05/13/16 2050 05/14/16 0555  BP: 116/72 112/66 105/64 120/76  Pulse: 99 95 75 85  Resp: '18 16 16 16  ' Temp: 98.3 F (36.8 C) 99.5 F (37.5 C) 98.4 F (36.9 C) 98.1 F (36.7 C)  TempSrc: Oral Oral Oral Oral  SpO2: 96% 95% 98% 100%  Weight:      Height:        Last BM Date: 05/13/16  Intake/Output   Yesterday:  12/16 0701 - 12/17 0700 In: 51 [P.O.:880; I.V.:3] Out: 402 [Urine:400; Stool:2] This shift:  Total I/O In: 640 [P.O.:640] Out: 700 [Urine:700]  Bowel function:  Flatus: YES  BM:  YES  Drain: (No drain)   Physical Exam:  General: Pt awake/alert/oriented x4 in No acute distress.  Smiling Eyes: PERRL, normal EOM.  Sclera clear.  No icterus Neuro: CN II-XII intact w/o focal sensory/motor deficits. Lymph: No head/neck/groin lymphadenopathy Psych:  No delerium/psychosis/paranoia HENT: Normocephalic, Mucus membranes moist.  No thrush Neck: Supple, No tracheal deviation Chest: No chest wall pain w good excursion CV:  Pulses intact.  Regular rhythm MS: Normal AROM mjr joints.  No obvious deformity Abdomen: Soft.  Nondistended.  Mildly tender at incisions only.  Old ostomy wound clean.  No evidence of peritonitis.  No incarcerated hernias. Ext:  SCDs BLE.  No mjr edema.  No cyanosis Skin: No petechiae / purpura  Results:   Labs: Results for orders placed or performed during the hospital encounter of 05/11/16 (from the past 48 hour(s))  CBC  Status: Abnormal   Collection Time: 05/13/16  4:47 AM  Result Value Ref Range   WBC 10.6 (H) 4.0 - 10.5 K/uL   RBC 3.08 (L) 3.87 - 5.11 MIL/uL   Hemoglobin 9.4 (L) 12.0 - 15.0 g/dL   HCT 27.2 (L) 36.0 - 46.0 %   MCV 88.3 78.0 - 100.0 fL   MCH 30.5 26.0 - 34.0 pg   MCHC 34.6 30.0 -  36.0 g/dL   RDW 26.7 (H) 11.5 - 15.5 %   Platelets 127 (L) 150 - 400 K/uL  Basic metabolic panel     Status: Abnormal   Collection Time: 05/13/16  4:47 AM  Result Value Ref Range   Sodium 137 135 - 145 mmol/L   Potassium 3.0 (L) 3.5 - 5.1 mmol/L   Chloride 103 101 - 111 mmol/L   CO2 27 22 - 32 mmol/L   Glucose, Bld 146 (H) 65 - 99 mg/dL   BUN 6 6 - 20 mg/dL   Creatinine, Ser 0.47 0.44 - 1.00 mg/dL   Calcium 7.9 (L) 8.9 - 10.3 mg/dL   GFR calc non Af Amer >60 >60 mL/min   GFR calc Af Amer >60 >60 mL/min    Comment: (NOTE) The eGFR has been calculated using the CKD EPI equation. This calculation has not been validated in all clinical situations. eGFR's persistently <60 mL/min signify possible Chronic Kidney Disease.    Anion gap 7 5 - 15  Glucose, capillary     Status: Abnormal   Collection Time: 05/13/16 12:49 PM  Result Value Ref Range   Glucose-Capillary 121 (H) 65 - 99 mg/dL  Glucose, capillary     Status: Abnormal   Collection Time: 05/13/16  4:48 PM  Result Value Ref Range   Glucose-Capillary 105 (H) 65 - 99 mg/dL  Glucose, capillary     Status: Abnormal   Collection Time: 05/13/16 10:42 PM  Result Value Ref Range   Glucose-Capillary 135 (H) 65 - 99 mg/dL  Glucose, capillary     Status: Abnormal   Collection Time: 05/14/16  7:55 AM  Result Value Ref Range   Glucose-Capillary 103 (H) 65 - 99 mg/dL    Imaging / Studies: No results found.  Medications / Allergies: per chart  Antibiotics: Anti-infectives    Start     Dose/Rate Route Frequency Ordered Stop   05/11/16 1121  ertapenem (INVANZ) 1 g in sodium chloride 0.9 % 50 mL IVPB     1 g 100 mL/hr over 30 Minutes Intravenous On call to O.R. 05/11/16 1121 05/11/16 1417        Note: Portions of this report may have been transcribed using voice recognition software. Every effort was made to ensure accuracy; however, inadvertent computerized transcription errors may be present.   Any transcriptional errors that  result from this process are unintentional.     Adin Hector, M.D., F.A.C.S. Gastrointestinal and Minimally Invasive Surgery [facility] [address], [place], [place] [address] [phone] Main / Paging   05/14/2016

## 2016-05-15 LAB — HEMOGLOBIN: HEMOGLOBIN: 9.5 g/dL — AB (ref 12.0–15.0)

## 2016-05-15 LAB — GLUCOSE, CAPILLARY: Glucose-Capillary: 90 mg/dL (ref 65–99)

## 2016-05-15 LAB — CREATININE, SERUM
CREATININE: 0.48 mg/dL (ref 0.44–1.00)
GFR calc Af Amer: >60 mL/min (ref >60)
GFR calc non Af Amer: >60 mL/min (ref >60)

## 2016-05-15 LAB — POTASSIUM: POTASSIUM: 2.9 mmol/L — AB (ref 3.5–5.1)

## 2016-05-15 LAB — MAGNESIUM: MAGNESIUM: 1.8 mg/dL (ref 1.7–2.4)

## 2016-05-15 MED ORDER — HYDROCODONE-ACETAMINOPHEN 5-325 MG PO TABS: 1.0000 | ORAL_TABLET | Freq: Four times a day (QID) | ORAL | 0 refills | Status: DC | PRN

## 2016-05-15 MED ORDER — IBUPROFEN 800 MG PO TABS: 800.0000 mg | ORAL_TABLET | Freq: Three times a day (TID) | ORAL | 0 refills | Status: DC | PRN

## 2016-05-15 NOTE — Progress Notes (Signed)
Patient completed her dressing change with appropriate demonstration and was able to teach back concepts.

## 2016-05-15 NOTE — Discharge Summary (Signed)
Physician Discharge Summary  Patient ID: Taylor Whitney MRN: [id number] DOB/AGE: February 04, 1971 45 y.o.  Admit date: 05/11/2016 Discharge date: 05/15/2016  Admission Diagnoses:  Discharge Diagnoses:  Principal Problem:   Cancer of left colon s/p colostomy takedown 05/11/2016 Active Problems:   Large bowel obstruction s/p colectomy/colostomy Aug 2017   Depression   Diabetes mellitus with complication (HCC)   Hypokalemia   Iron deficiency anemia   Discharged Condition: good  Hospital Course: 45 yo female presented for colostomy reversal. She was admitted after surgery. She did well, ambulating early, foley removed POD 2. She was advanced diet over the first 3 days and had return of bowel function POD 3. She was discharged home POD 4  Consults: None  Significant Diagnostic Studies:  CBC    Component Value Date/Time   WBC 10.6 (H) 05/13/2016 0447   RBC 3.08 (L) 05/13/2016 0447   HGB 9.5 (L) 05/15/2016 0454   HGB 10.9 (L) 04/28/2016 0830   HCT 27.2 (L) 05/13/2016 0447   HCT 33.0 (L) 04/28/2016 0830   PLT 127 (L) 05/13/2016 0447   PLT 162 04/28/2016 0830   MCV 88.3 05/13/2016 0447   MCV 85.6 04/28/2016 0830   MCH 30.5 05/13/2016 0447   MCHC 34.6 05/13/2016 0447   RDW 26.7 (H) 05/13/2016 0447   RDW 30.2 (H) 04/28/2016 0830   LYMPHSABS 2.6 05/09/2016 1349   LYMPHSABS 2.0 04/28/2016 0830   MONOABS 0.9 05/09/2016 1349   MONOABS 0.7 04/28/2016 0830   EOSABS 0.1 05/09/2016 1349   EOSABS 0.1 04/28/2016 0830   BASOSABS 0.0 05/09/2016 1349   BASOSABS 0.0 04/28/2016 0830     Treatments: surgery: colostomy reversal  Discharge Exam: Blood pressure (!) 138/91, pulse 87, temperature 98.1 F (36.7 C), temperature source Oral, resp. rate 16, height 5' 4.5" (1.638 m), weight 70.3 kg (155 lb), last menstrual period 05/08/2016, SpO2 100 %. General appearance: alert and cooperative Head: Normocephalic, without obvious abnormality, atraumatic Resp: clear to auscultation  bilaterally Cardio: regular rate and rhythm, S1, S2 normal, no murmur, click, rub or gallop GI: midline c/d/i, staples in place, RUQ open wound healthy tissue, appropriate tender to palpation  Disposition: 06-Home-Health Care Svc  Discharge Instructions    Call MD for:    Complete by:  As directed    FEVER > 101.5 F (Temperatures <101.23F can occasionally happen and are not significant)   Call MD for:  extreme fatigue    Complete by:  As directed    Call MD for:  persistant dizziness or light-headedness    Complete by:  As directed    Call MD for:  persistant nausea and vomiting    Complete by:  As directed    Call MD for:  redness, tenderness, or signs of infection (pain, swelling, redness, odor or green/yellow discharge around incision site)    Complete by:  As directed    Call MD for:  severe uncontrolled pain    Complete by:  As directed    Diet - low sodium heart healthy    Complete by:  As directed    Follow a light diet the first few days at home.    If you feel full, bloated, or constipated, stay on a liquid diet until you feel better and not constipated. Gradually get back to a solid diet.  Avoid fast food or heavy meals the first week as you are more likely to get nauseated. It is expected for your digestive tract to need a few months to  get back to normal.   Diet - low sodium heart healthy    Complete by:  As directed    Discharge wound care:    Complete by:  As directed    You have an open wound. If you have an open wound with a wound vac, see wound vac care instructions. If the wound is being packed, see wound care instructions.    In general, it is encouraged that you remove your dressing and packing, shower with soap & water, and replace your dressing once a day.   Pack the wound with clean gauze moistened with normal (0.9%) saline to keep the wound moist & uninfected.   Pressure on the dressing for 30 minutes will stop most wound bleeding.   Eventually your body  will heal & pull the open wound closed over the next few months.  Raw open wounds will occasionally bleed or secrete yellow drainage until it heals closed.   Drain sites will drain a little until the drain is removed.   Even closed incisions can have mild bleeding or drainage the first few days until the skin edges scab over & seal.   Discharge wound care:    Complete by:  As directed    Saline moistened gauze to right open wound 2x daily. Ok to shower   Driving Restrictions    Complete by:  As directed    You may drive when you are no longer taking narcotic prescription pain medication, you can comfortably wear a seatbelt, and you can safely make sudden turns/stops to protect yourself without hesitating due to pain.   Increase activity slowly    Complete by:  As directed    Increase activity slowly    Complete by:  As directed    Lifting restrictions    Complete by:  As directed    Start light daily activities --- self-care, walking, climbing stairs- beginning the day after surgery.   Gradually increase activities as tolerated.   Control your pain to be active.   Stop when you are tired.   Ideally, walk several times a day, eventually an hour a day.   Most people are back to most day-to-day activities in a few weeks.  It takes 4-8 weeks to get back to unrestricted, intense activity. If you can walk 30 minutes without difficulty, it is safe to try more intense activity such as jogging, treadmill, bicycling, low-impact aerobics, swimming, etc. Save the most intensive and strenuous activity for last (Usually 4-8 weeks after surgery) such as sit-ups, heavy lifting, contact sports, etc.   Refrain from any intense heavy lifting or straining until you are off narcotics for pain control.  You will have off days, but things should improve week-by-week. DO NOT PUSH THROUGH PAIN.   Let pain be your guide: If it hurts to do something, don't do it.  Pain is your body warning you to avoid that activity  for another week until the pain goes down.   May shower / Bathe    Complete by:  As directed    May walk up steps    Complete by:  As directed    Sexual Activity Restrictions    Complete by:  As directed    You may have sexual intercourse when it is comfortable. If it hurts to do something, stop.     Allergies as of 05/15/2016      Reactions   Sulfa Antibiotics Rash      Medication List  TAKE these medications   capecitabine 500 MG tablet Commonly known as:  XELODA Take on days 1-14 of chemotherapy. What changed:  how much to take  how to take this  when to take this  additional instructions   ferrous sulfate 325 (65 FE) MG tablet Take 325 mg by mouth daily with breakfast.   HYDROcodone-acetaminophen 5-325 MG tablet Commonly known as:  NORCO/VICODIN Take 1-2 tablets by mouth every 6 (six) hours as needed for moderate pain.   ibuprofen 800 MG tablet Commonly known as:  ADVIL,MOTRIN Take 1 tablet (800 mg total) by mouth every 8 (eight) hours as needed.   metFORMIN 500 MG 24 hr tablet Commonly known as:  GLUCOPHAGE-XR Take 500 mg by mouth 2 (two) times daily.   ondansetron 8 MG tablet Commonly known as:  ZOFRAN Take 1 tablet (8 mg total) by mouth 2 (two) times daily as needed for refractory nausea / vomiting. Start on day 3 after chemotherapy.   potassium chloride SA 20 MEQ tablet Commonly known as:  K-DUR,KLOR-CON Take 1 tablet (20 mEq total) by mouth daily.   prochlorperazine 10 MG tablet Commonly known as:  COMPAZINE Take 1 tablet (10 mg total) by mouth every 6 (six) hours as needed (Nausea or vomiting).   venlafaxine XR 75 MG 24 hr capsule Commonly known as:  EFFEXOR-XR Take 225 mg by mouth at bedtime.      Follow-up Information    Arta Bruce Graycee Greeson, MD. Schedule an appointment as soon as possible for a visit in 2 week(s).   Specialty:  General Surgery Contact information: [address] [place] [address] [phone]         [facility], [place] Follow up in 8 day(s).   Specialty:  General Surgery Why:  nursing visit for staple removal Contact information: [address] [place] [phone]          Signed: Arta Bruce Merilyn Pagan 05/15/2016, 8:51 AM

## 2016-05-17 ENCOUNTER — Encounter: Payer: BLUE CROSS/BLUE SHIELD | Admitting: Gastroenterology

## 2016-05-18 ENCOUNTER — Encounter: Payer: Self-pay | Admitting: *Deleted

## 2016-05-18 NOTE — Progress Notes (Signed)
QI encounter for [facility] 

## 2016-06-06 DIAGNOSIS — G43909 Migraine, unspecified, not intractable, without status migrainosus: Secondary | ICD-10-CM | POA: Diagnosis not present

## 2016-06-06 DIAGNOSIS — C186 Malignant neoplasm of descending colon: Secondary | ICD-10-CM | POA: Diagnosis not present

## 2016-06-06 DIAGNOSIS — F322 Major depressive disorder, single episode, severe without psychotic features: Secondary | ICD-10-CM | POA: Diagnosis not present

## 2016-06-06 DIAGNOSIS — E119 Type 2 diabetes mellitus without complications: Secondary | ICD-10-CM | POA: Diagnosis not present

## 2016-06-08 NOTE — Progress Notes (Signed)
[place]  [phone] [phone]  Clinic Follow up Note   Patient Care Team: Donald Prose, MD as PCP - General (Family Medicine) Truitt Merle, MD as Consulting Physician (Hematology) Mickeal Skinner, MD as Consulting Physician (General Surgery) 06/09/2016   CHIEF COMPLAINTS:  Follow up stage IIIB colon cancer   Oncology History   Cancer of left colon [facility])   Staging form: Colon and Rectum, AJCC 7th Edition   - Pathologic stage from 01/18/2016: Stage IIIB (T3, N1a, cM0) - Signed by Truitt Merle, MD on 02/01/2016      Cancer of left colon s/p colostomy takedown 05/11/2016   01/17/2016 Initial Diagnosis    Cancer of left colon ([place])      01/17/2016 Procedure    Flexible sigmoidoscopy showed malignant-appearing obstructing tumor in the descending colon, biopsied.      01/18/2016 Surgery    Patient underwent left hemicolectomy       01/18/2016 Pathology Results    Left hemicolectomy showed invasive adenocarcinoma, well differentiated, 4.1 cm, tumor extends into pericolonic soft tissue, lymphovascular invasion is identified, margins were negative, 1 out of 27 lymph nodes were positive.      02/24/2016 - 04/28/2016 Chemotherapy    Adjuvant CAPOX (Xeloda on day 1-14, oxaliplatin on day 1) every 3 weeks, for total of 4 cycles        HISTORY OF PRESENTING ILLNESS (02/01/2016):  Taylor Whitney 46 y.o. female is here because of her recently diagnosed stage IIIB colon cancer, she presents to my clinic by herself, and her two sisters were on the phone during her visit.   She presents to [facility] hospital on 01/16/2016 with nausea and vomiting for one day. She had a CT scan and flexible sigmoidoscopy which showed a near obstructing mass in the descending colon. She was seen by surgeon Dr. Kieth Brightly and underwent left colectomy and end colostomy. There were minor injury to the duodenum during her surgery, postop study showed no leakage from duodenum. She  tolerated surgery and recovered well, was discharged home after 9 days of hospital stay.  She has been recovering well from surgery, has mimimum pain at incisions, her appetite is still relatively low, her energy level has improved, she table to function and tolerate daily activities without much difficulty. She lost about 20lbs in the past few months.  She was diagnosed with DM 6 months ago, on metformin, does not check her blood glucose at home.  She was diagnosed with HL stage III in 1996, she was diagnosed and treated in [place], s/p chemo ABVD 9 months (bleomycin was held earlier due to pulmonary toxicities), no radiation. She followed with her oncologist for 5 years after treatment.   CURRENT THERAPY: Surveillance  INTERIM HISTORY: Taylor Whitney returns for follow-up. She completed chemotherapy a month ago, and has recovered well. Her appetite and energy level has been much better, she denies any pain, neuropathy, or other residual side effects from chemotherapy. She is back to work full time this week. No other complaints.  MEDICAL HISTORY:  Past Medical History:  Diagnosis Date  . Adenocarcinoma of descending colon ([place]) 12/2015   Flex sigmoidoscopy biopsy of descending colon mass is adenocarcinoma/notes 01/18/2016  . Anemia   . Cancer [facility])    Hodgkin's lymphoma- Dr. Burr Medico  . Depression   . Family history of kidney cancer   . Hodgkin lymphoma ([place]) dx'd 01/1995  . Large bowel obstruction s/p colectomy/colostomy Aug 2017 01/16/2016  . Migraine    "maybe  once/month" (01/18/2016)  . Type II diabetes mellitus ([place]) 06/2015    SURGICAL HISTORY: Past Surgical History:  Procedure Laterality Date  . COLON RESECTION N/A 01/18/2016   Procedure: OPEN PARTIAL COLECTOMY WITH END COLOSTOMY;  Surgeon: Mickeal Skinner, MD;  Location: [facility];  Service: General;  Laterality: N/A;  . COLOSTOMY  01/18/2016  . COLOSTOMY TAKEDOWN N/A 05/11/2016   Procedure: LAPAROSCOPIC CONVERTED TO OPEN REVERSAL  OF END COLOSTOMY;  Surgeon: Arta Bruce Kinsinger, MD;  Location: [facility];  Service: General;  Laterality: N/A;  . DILATION AND CURETTAGE OF UTERUS    . FLEXIBLE SIGMOIDOSCOPY N/A 01/17/2016   Procedure: FLEXIBLE SIGMOIDOSCOPY;  Surgeon: Ladene Artist, MD;  Location: [facility] ENDOSCOPY;  Service: Endoscopy;  Laterality: N/A;  . LYMPH NODE BIOPSY  1996  . MANDIBLE SURGERY  1988  . PARTIAL COLECTOMY  01/18/2016  . PROCTOSCOPY  05/11/2016   Procedure: PROCTOSCOPY;  Surgeon: Mickeal Skinner, MD;  Location: [facility];  Service: General;;    SOCIAL HISTORY: Social History   Social History  . Marital status: Single    Spouse name: N/A  . Number of children: 0  . Years of education: N/A   Occupational History  . Professor     [employer]   Social History Main Topics  . Smoking status: Never Smoker  . Smokeless tobacco: Never Used  . Alcohol use Yes     Comment: rare social  . Drug use: No  . Sexual activity: Not Currently   Other Topics Concern  . 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 Diabetes Mother   . Cancer Father     prostate cancer  . Cancer Paternal Uncle     renal cancer ; smoker  . Diabetes Maternal Grandmother   . Diabetes Maternal Grandfather   . Clotting disorder Paternal Grandmother   . COPD Paternal Grandfather     ALLERGIES:  is allergic to sulfa antibiotics.  MEDICATIONS:  Current Outpatient Prescriptions  Medication Sig Dispense Refill  . ferrous sulfate 325 (65 FE) MG tablet Take 325 mg by mouth daily with breakfast.    . metFORMIN (GLUCOPHAGE-XR) 500 MG 24 hr tablet Take 500 mg by mouth 2 (two) times daily.  0  . potassium chloride SA (K-DUR,KLOR-CON) 20 MEQ tablet Take 1 tablet (20 mEq total) by  mouth daily. 40 tablet 1  . venlafaxine XR (EFFEXOR-XR) 75 MG 24 hr capsule Take 225 mg by mouth at bedtime.  5  . HYDROcodone-acetaminophen (NORCO/VICODIN) 5-325 MG tablet Take 1-2 tablets by mouth every 6 (six) hours as needed for moderate pain. (Patient not taking: Reported on 06/09/2016) 30 tablet 0  . ibuprofen (ADVIL,MOTRIN) 800 MG tablet Take 1 tablet (800 mg total) by mouth every 8 (eight) hours as needed. (Patient not taking: Reported on 06/09/2016) 30 tablet 0  . ondansetron (ZOFRAN) 8 MG tablet Take 1 tablet (8 mg total) by mouth 2 (two) times daily as needed for refractory nausea / vomiting. Start on day 3 after chemotherapy. (Patient not taking: Reported on 06/09/2016) 30 tablet 1  . prochlorperazine (COMPAZINE) 10 MG tablet Take 1 tablet (10 mg total) by mouth every 6 (six) hours as needed (Nausea or  vomiting). (Patient not taking: Reported on 06/09/2016) 30 tablet 1   No current facility-administered medications for this visit.     REVIEW OF SYSTEMS:   Constitutional: Denies fevers, chills or abnormal night sweats (+) malaise/fatigue Eyes: Denies blurriness of vision, double vision or watery eyes Ears, nose, mouth, throat, and face: Denies mucositis or sore throat Respiratory: Denies cough, dyspnea or wheezes Cardiovascular: Denies palpitation, chest discomfort or lower extremity swelling Gastrointestinal:  Denies nausea, heartburn or change in bowel habits Skin: Denies abnormal skin rashes Lymphatics: Denies new lymphadenopathy or easy bruising Neurological:Denies numbness, tingling or new weaknesses Behavioral/Psych: Mood is stable, no new changes  All other systems were reviewed with the patient and are negative.  PHYSICAL EXAMINATION: ECOG PERFORMANCE STATUS: 0  Vitals:   06/09/16 1002  BP: 132/81  Pulse: 89  Resp: 17  Temp: 98.8 F (37.1 C)   Filed Weights   06/09/16 1002  Weight: 151 lb 4.8 oz (68.6 kg)    GENERAL:alert, no distress and comfortable SKIN: skin  color, texture, turgor are normal, no rashes or significant lesions EYES: normal, conjunctiva are pink and non-injected, sclera clear OROPHARYNX:no exudate, no erythema and lips, buccal mucosa, and tongue normal  NECK: supple, thyroid normal size, non-tender, without nodularity LYMPH:  no palpable lymphadenopathy in the cervical, axillary or inguinal LUNGS: clear to auscultation and percussion with normal breathing effort HEART: regular rate & rhythm and no murmurs and no lower extremity edema ABDOMEN:abdomen soft, non-tender and normal bowel sounds, Surgical scar has well-healed, no tenderness  Musculoskeletal:no cyanosis of digits and no clubbing  PSYCH: alert & oriented x 3 with fluent speech NEURO: no focal motor/sensory deficits  LABORATORY DATA:  I have reviewed the data as listed CBC Latest Ref Rng & Units 06/09/2016 05/15/2016 05/13/2016  WBC 3.9 - 10.3 10e3/uL 5.7 - 10.6(H)  Hemoglobin 11.6 - 15.9 g/dL 11.1(L) 9.5(L) 9.4(L)  Hematocrit 34.8 - 46.6 % 32.8(L) - 27.2(L)  Platelets 145 - 400 10e3/uL 171 - 127(L)   CMP Latest Ref Rng & Units 06/09/2016 05/15/2016 05/13/2016  Glucose 70 - 140 mg/dl 197(H) - 146(H)  BUN 7.0 - 26.0 mg/dL 8.3 - 6  Creatinine 0.6 - 1.1 mg/dL 0.8 0.48 0.47  Sodium 136 - 145 mEq/L 141 - 137  Potassium 3.5 - 5.1 mEq/L 2.9(LL) 2.9(L) 3.0(L)  Chloride 101 - 111 mmol/L - - 103  CO2 22 - 29 mEq/L 22 - 27  Calcium 8.4 - 10.4 mg/dL 9.6 - 7.9(L)  Total Protein 6.4 - 8.3 g/dL 7.7 - -  Total Bilirubin 0.20 - 1.20 mg/dL 0.92 - -  Alkaline Phos 40 - 150 U/L 88 - -  AST 5 - 34 U/L 15 - -  ALT 0 - 55 U/L 7 - -     PATHOLOGY REPORT  Diagnosis 01/18/2016 Colon, segmental resection for tumor, Left - INVASIVE ADENOCARCINOMA, WELL DIFFERENTIATED, SPANNING 4.1 CM. - ADENOCARCINOMA EXTENDS INTO PERICOLONIC SOFT TISSUE. - LYMPHOVASCULAR INVASION IS IDENTIFIED. - THE SURGICAL RESECTION MARGINS ARE NEGATIVE FOR CARCINOMA. - METASTATIC CARCINOMA IN 1 OF 27 LYMPH NODES  (1/27). - DIVERTICULAR DISEASE (GROSS DIAGNOSIS). - SEE ONCOLOGY TABLE BELOW. Microscopic Comment COLON AND RECTUM (INCLUDING TRANS-ANAL RESECTION): Specimen: Left colon. Procedure: Resection. Tumor site: Central portion of specimen. Specimen integrity: Intact. Macroscopic intactness of mesorectum: N/A Macroscopic tumor perforation: Not identified. Invasive tumor: Maximum size: 4.1 cm Histologic type(s): Adenocarcinoma Histologic grade and differentiation: G1: well differentiated/low grade. Type of polyp in which invasive carcinoma arose: Likely tubular adenoma. Microscopic extension of  invasive tumor: Extends into the pericolonic soft tissue. Lymph-Vascular invasion: Present, focal Peri-neural invasion: Not identified. Tumor deposit(s) (discontinuous extramural extension): Not identified. Resection margins: Proximal margin: 14.1 cm Distal margin: 15.8 cm Circumferential (radial) (posterior ascending, posterior descending; lateral and posterior mid-rectum; and entire lower 1/3 rectum): 3.0 cm 1 of 3 FINAL for ERIK, NESSEL ([id number]) Microscopic Comment(continued) Treatment effect (neo-adjuvant therapy): N/A Additional polyp(s): Not identified. Non-neoplastic findings: Diverticular disease and splenule. Lymph nodes: number examined - 27; number positive: 1 Pathologic Staging: pT3, pN1a Ancillary studies: A block will be sent for MMR testing by IHC and a separate block will be sent for MSI testing by PCR. Additional studies can be performed upon clinician request. (JBK:gt, 01/19/16) Enid Cutter MD Pathologist, Electronic Signature (Case signed 01/19/2016)     Diagnosis 01/17/2016 Colon, biopsy, descending - POSITIVE FOR ADENOCARCINOMA. Microscopic Comment The findings are called to Dr. Fuller Plan on 01/18/16. Dr. Orene Desanctis has seen this case in consultation with agreement. (RAH;gt, 01/18/16)   RADIOGRAPHIC STUDIES: I have personally reviewed the radiological images as  listed and agreed with the findings in the report. No results found. Sigmoidoscopy 01/17/2016 - Malignant appearing, obstructing tumor in the descending colon. Biopsied. - Otherwise normal flex sig however limited by poor prep.  CT Chest wo contrast 02/10/2016 IMPRESSION: 1. In both upper lobes there are single 3 mm ground-glass density nodules. No follow-up needed if patient is low-risk (and has no known or suspected primary neoplasm). Non-contrast chest CT can be considered in 12 months if patient is high-risk. This recommendation follows the consensus statement: Guidelines for Management of Incidental Pulmonary Nodules Detected on CT Images: From the Fleischner Society 2017; Radiology 2017; 284:228-243. 2. Focal thickening or small fluid density lesion along the left posteromedial hemidiaphragm, not changed from 2008, considered benign. 3. Thoracic spondylosis. 4. No characteristic findings for metastatic disease to the chest.  ASSESSMENT & PLAN:  46 year old with past medical history of diabetes, remote history of Hodgkin lymphoma, presented with bowel obstruction.  1. Cancer of left colon, invasive adenocarcinoma, well-differentiated, pT3N1aM0, stage IIIB, MSI-stable  -I previously reviewed her surgical pathology findings with patient and her sisters in great details -Her CT of the abdomen was negative for distant metastasis -I previously reviewed her staging CT chest which showed no evidence of metastasis. -We previously discussed risk of cancer recurrence after complete surgical resection. Given her positive lymph nodes, stage IIIB disease, LVI (+), she is at high risk for recurrence.  -We previously discussed the standard care for stage III colon cancer is adjuvant chemotherapy. Given his young age, I recommend combined chemotherapy, FOLFOX or CAPEOX. she opted CAPEOX. -She tolerated adjuvant chemotherapy CAPOX well, completed planned for cycle treatments -She is clinically doing  well, laboratory results reviewed with her, exam is unremarkable. No clinical concerns for recurrence. -We discussed colon cancer surveillance. We like to see her every 3-4 months for the first 2-3 years, then every 6 months afterwards. -I'll obtain a surveillance CT scan in 2 months.   2. Iron deficient anemia -Her iron study showed iron deficiency, probably from her previous GI bleeding from her colon cancer. -We previously discussed the treatment option of oral iron supplement versus IV iron. She will start ferrous sulfate 1-2 tablets a day, with vitamin C or orange juice.  If she has poor response or tolerance to oral iron, I'll consider IV iron. -Her anemia has much improved, she will continue oral iron pill  3. 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  This deletion is likely pathogenic with respect to EPCAM function, but it IS NOT expected to cause Lynch syndrome.  Two pathogenic mutations in EPCAM can cause a rare autosomal recessive condition called congenital diarrhea with tufting enteropathy (CTE).  The Colorectal Cancer Panel offered by [facility] includes sequencing and/or duplication/deletion testing of the following 19 genes: APC, ATM, AXIN2, BMPR1A, CDH1, CHEK2, EPCAM, MLH1, MSH2, MSH6, MUTYH, PMS2, POLD1, POLE, PTEN, SCG5/GREM1, SMAD4, STK11, and TP53. The report date is March 27, 2016.  4. DM -She was diagnosed with diabetes in earlier 2017. She is on metformin -I strongly encouraged her to monitor her blood sugar at home -We discussed chemotherapy may impact her blood sugar level, especially premedication dexamethasone.  -Her random blood glucose was 196  today, I strongly encouraged her to follow-up with her primary care physician  5. Hypokalemia -She is on potassium chloride 20 meq daily, potassium level low at 2.9 today. Potassium chloride refilled today, I instructed her to increase to 40 meq daily for 7 days, then continue 20 meq daily for 2 more months  -Continue potassium chloride, increase to twice daily for 5 days, then once daily  PLAN -lab reviewed, she will  increase to 40 meq daily for 7 days, then continue 20 meq daily for 2 more months, I refilled for her  -RTC in 2 months for follow up, with lab and surveillance CT abdomen and pelvis with contrast. His peripheral   All questions were answered. The patient knows to call the clinic with any problems, questions or concerns. I spent 20 minutes counseling the patient face to face. The total time spent in the appointment was 25 minutes and more than 50% was on counseling.  This document serves as a record of services personally performed by Truitt Merle, MD. It was created on her behalf by Arlyce Harman, a trained medical scribe. The creation of this record is based on the scribe's personal observations and the provider's statements to them. This document has been checked and approved by the attending provider.     Truitt Merle, MD 06/09/2016

## 2016-06-09 ENCOUNTER — Telehealth: Payer: Self-pay | Admitting: Hematology

## 2016-06-09 ENCOUNTER — Ambulatory Visit (HOSPITAL_BASED_OUTPATIENT_CLINIC_OR_DEPARTMENT_OTHER): Payer: BLUE CROSS/BLUE SHIELD | Admitting: Hematology

## 2016-06-09 ENCOUNTER — Other Ambulatory Visit (HOSPITAL_BASED_OUTPATIENT_CLINIC_OR_DEPARTMENT_OTHER): Payer: BLUE CROSS/BLUE SHIELD

## 2016-06-09 VITALS — BP 132/81 | HR 89 | Temp 98.8°F | Resp 17 | Ht 64.5 in | Wt 151.3 lb

## 2016-06-09 DIAGNOSIS — C186 Malignant neoplasm of descending colon: Secondary | ICD-10-CM

## 2016-06-09 DIAGNOSIS — E118 Type 2 diabetes mellitus with unspecified complications: Secondary | ICD-10-CM | POA: Diagnosis not present

## 2016-06-09 DIAGNOSIS — E876 Hypokalemia: Secondary | ICD-10-CM

## 2016-06-09 DIAGNOSIS — D500 Iron deficiency anemia secondary to blood loss (chronic): Secondary | ICD-10-CM | POA: Diagnosis not present

## 2016-06-09 DIAGNOSIS — F329 Major depressive disorder, single episode, unspecified: Secondary | ICD-10-CM

## 2016-06-09 DIAGNOSIS — F32A Depression, unspecified: Secondary | ICD-10-CM

## 2016-06-09 LAB — CBC WITH DIFFERENTIAL/PLATELET
BASO%: 0.6 % (ref 0.0–2.0)
Basophils Absolute: 0 10*3/uL (ref 0.0–0.1)
EOS%: 2.1 % (ref 0.0–7.0)
Eosinophils Absolute: 0.1 10*3/uL (ref 0.0–0.5)
HCT: 32.8 % — ABNORMAL LOW (ref 34.8–46.6)
HGB: 11.1 g/dL — ABNORMAL LOW (ref 11.6–15.9)
LYMPH#: 1.9 10*3/uL (ref 0.9–3.3)
LYMPH%: 32.8 % (ref 14.0–49.7)
MCH: 30.3 pg (ref 25.1–34.0)
MCHC: 33.8 g/dL (ref 31.5–36.0)
MCV: 89.7 fL (ref 79.5–101.0)
MONO#: 0.4 10*3/uL (ref 0.1–0.9)
MONO%: 6.8 % (ref 0.0–14.0)
NEUT%: 57.7 % (ref 38.4–76.8)
NEUTROS ABS: 3.3 10*3/uL (ref 1.5–6.5)
PLATELETS: 171 10*3/uL (ref 145–400)
RBC: 3.66 10*6/uL — AB (ref 3.70–5.45)
RDW: 20.6 % — ABNORMAL HIGH (ref 11.2–14.5)
WBC: 5.7 10*3/uL (ref 3.9–10.3)

## 2016-06-09 LAB — COMPREHENSIVE METABOLIC PANEL
ALT: 7 U/L (ref 0–55)
AST: 15 U/L (ref 5–34)
Albumin: 3.7 g/dL (ref 3.5–5.0)
Alkaline Phosphatase: 88 U/L (ref 40–150)
Anion Gap: 14 mEq/L — ABNORMAL HIGH (ref 3–11)
BILIRUBIN TOTAL: 0.92 mg/dL (ref 0.20–1.20)
BUN: 8.3 mg/dL (ref 7.0–26.0)
CHLORIDE: 104 meq/L (ref 98–109)
CO2: 22 meq/L (ref 22–29)
CREATININE: 0.8 mg/dL (ref 0.6–1.1)
Calcium: 9.6 mg/dL (ref 8.4–10.4)
EGFR: >90 mL/min/{1.73_m2} (ref >90)
GLUCOSE: 197 mg/dL — AB (ref 70–140)
Potassium: 2.9 mEq/L — CL (ref 3.5–5.1)
SODIUM: 141 meq/L (ref 136–145)
TOTAL PROTEIN: 7.7 g/dL (ref 6.4–8.3)

## 2016-06-09 MED ORDER — POTASSIUM CHLORIDE CRYS ER 20 MEQ PO TBCR: 20.0000 meq | EXTENDED_RELEASE_TABLET | Freq: Every day | ORAL | 1 refills | Status: DC

## 2016-06-09 NOTE — Telephone Encounter (Signed)
Appointments scheduled per 1/12 LOS. Patient given AVS report and calendars with future scheduled appointments. Two bottles of contrast and instructions given to patient for CT scan appointment.

## 2016-06-20 NOTE — Telephone Encounter (Signed)
ERROR

## 2016-07-10 ENCOUNTER — Encounter (HOSPITAL_COMMUNITY): Payer: Self-pay

## 2016-07-11 ENCOUNTER — Encounter (HOSPITAL_COMMUNITY): Payer: Self-pay

## 2016-08-07 ENCOUNTER — Other Ambulatory Visit (HOSPITAL_BASED_OUTPATIENT_CLINIC_OR_DEPARTMENT_OTHER): Payer: BLUE CROSS/BLUE SHIELD

## 2016-08-07 ENCOUNTER — Ambulatory Visit (HOSPITAL_COMMUNITY)
Admission: RE | Admit: 2016-08-07 | Discharge: 2016-08-07 | Disposition: A | Discharge status: Home / Self Care | Payer: BLUE CROSS/BLUE SHIELD | Source: Ambulatory Visit | Attending: Hematology | Admitting: Hematology

## 2016-08-07 DIAGNOSIS — Z9049 Acquired absence of other specified parts of digestive tract: Secondary | ICD-10-CM | POA: Insufficient documentation

## 2016-08-07 DIAGNOSIS — C186 Malignant neoplasm of descending colon: Secondary | ICD-10-CM | POA: Insufficient documentation

## 2016-08-07 DIAGNOSIS — C189 Malignant neoplasm of colon, unspecified: Secondary | ICD-10-CM | POA: Diagnosis not present

## 2016-08-07 LAB — CBC WITH DIFFERENTIAL/PLATELET
BASO%: 0.8 % (ref 0.0–2.0)
Basophils Absolute: 0.1 10*3/uL (ref 0.0–0.1)
EOS ABS: 0.2 10*3/uL (ref 0.0–0.5)
EOS%: 2.2 % (ref 0.0–7.0)
HCT: 38.5 % (ref 34.8–46.6)
HEMOGLOBIN: 12.9 g/dL (ref 11.6–15.9)
LYMPH%: 31.3 % (ref 14.0–49.7)
MCH: 28.2 pg (ref 25.1–34.0)
MCHC: 33.5 g/dL (ref 31.5–36.0)
MCV: 84.2 fL (ref 79.5–101.0)
MONO#: 0.6 10*3/uL (ref 0.1–0.9)
MONO%: 7.5 % (ref 0.0–14.0)
NEUT%: 58.2 % (ref 38.4–76.8)
NEUTROS ABS: 4.8 10*3/uL (ref 1.5–6.5)
PLATELETS: 237 10*3/uL (ref 145–400)
RBC: 4.57 10*6/uL (ref 3.70–5.45)
RDW: 15.3 % — ABNORMAL HIGH (ref 11.2–14.5)
WBC: 8.3 10*3/uL (ref 3.9–10.3)
lymph#: 2.6 10*3/uL (ref 0.9–3.3)

## 2016-08-07 LAB — COMPREHENSIVE METABOLIC PANEL
ALBUMIN: 4.1 g/dL (ref 3.5–5.0)
ALK PHOS: 100 U/L (ref 40–150)
ALT: 20 U/L (ref 0–55)
ANION GAP: 15 meq/L — AB (ref 3–11)
AST: 24 U/L (ref 5–34)
BILIRUBIN TOTAL: 0.64 mg/dL (ref 0.20–1.20)
BUN: 13.5 mg/dL (ref 7.0–26.0)
CO2: 20 meq/L — AB (ref 22–29)
Calcium: 9.6 mg/dL (ref 8.4–10.4)
Chloride: 105 mEq/L (ref 98–109)
Creatinine: 0.8 mg/dL (ref 0.6–1.1)
EGFR: 87 mL/min/{1.73_m2} — AB (ref >90)
GLUCOSE: 142 mg/dL — AB (ref 70–140)
Potassium: 4.3 mEq/L (ref 3.5–5.1)
SODIUM: 140 meq/L (ref 136–145)
TOTAL PROTEIN: 8.4 g/dL — AB (ref 6.4–8.3)

## 2016-08-07 LAB — CEA (IN HOUSE-CHCC): CEA (CHCC-In House): 1.59 ng/mL (ref 0.00–5.00)

## 2016-08-07 MED ORDER — IOPAMIDOL (ISOVUE-300) INJECTION 61%
INTRAVENOUS | Status: AC
  Filled 2016-08-07: qty 100

## 2016-08-07 MED ORDER — IOPAMIDOL (ISOVUE-300) INJECTION 61%
100.0000 mL | Freq: Once | INTRAVENOUS | Status: AC | PRN
  Administered 2016-08-07: 100 mL via INTRAVENOUS

## 2016-08-08 NOTE — Progress Notes (Signed)
[facility]  Telephone:([phone]) [phone] Fax:([phone]) [phone]  Clinic Follow up Note   Patient Care Team: Deatra James, MD as PCP - General (Family Medicine) Malachy Mood, MD as Consulting Physician (Hematology) Rodman Pickle, MD as Consulting Physician (General Surgery) 08/10/2016   CHIEF COMPLAINTS:  Follow up stage IIIB colon cancer   Oncology History   Cancer of left colon [facility])   Staging form: Colon and Rectum, AJCC 7th Edition   - Pathologic stage from 01/18/2016: Stage IIIB (T3, N1a, cM0) - Signed by Malachy Mood, MD on 02/01/2016      Cancer of left colon s/p colostomy takedown 05/11/2016   01/17/2016 Initial Diagnosis    Cancer of left colon (HCC)      01/17/2016 Procedure    Flexible sigmoidoscopy showed malignant-appearing obstructing tumor in the descending colon, biopsied.      01/18/2016 Surgery    Patient underwent left hemicolectomy       01/18/2016 Pathology Results    Left hemicolectomy showed invasive adenocarcinoma, well differentiated, 4.1 cm, tumor extends into pericolonic soft tissue, lymphovascular invasion is identified, margins were negative, 1 out of 27 lymph nodes were positive.      02/24/2016 - 04/28/2016 Chemotherapy    Adjuvant CAPOX (Xeloda on day 1-14, oxaliplatin on day 1) every 3 weeks, for total of 4 cycles       08/07/2016 Imaging    CT A/P w contrast  IMPRESSION: 1. No complications identified status post partial colectomy. 2. There is a soft tissue attenuating nodule within the peritoneal cavity which is indeterminate. Although this may represent postsurgical change peritoneal metastasis cannot be excluded and close interval follow-up is advised.       HISTORY OF PRESENTING ILLNESS (02/01/2016):  Taylor Whitney 46 y.o. female is here because of her recently diagnosed stage IIIB colon cancer, she presents to my clinic by herself, and her two sisters were on the phone during her visit.   She presents to [facility] hospital on  01/16/2016 with nausea and vomiting for one day. She had a CT scan and flexible sigmoidoscopy which showed a near obstructing mass in the descending colon. She was seen by surgeon Dr. Sheliah Hatch and underwent left colectomy and end colostomy. There were minor injury to the duodenum during her surgery, postop study showed no leakage from duodenum. She tolerated surgery and recovered well, was discharged home after 9 days of hospital stay.  She has been recovering well from surgery, has mimimum pain at incisions, her appetite is still relatively low, her energy level has improved, she table to function and tolerate daily activities without much difficulty. She lost about 20lbs in the past few months.  She was diagnosed with DM 6 months ago, on metformin, does not check her blood glucose at home.  She was diagnosed with HL stage III in 1996, she was diagnosed and treated in [place], s/p chemo ABVD 9 months (bleomycin was held earlier due to pulmonary toxicities), no radiation. She followed with her oncologist for 5 years after treatment.   CURRENT THERAPY: Surveillance  INTERIM HISTORY: Chryl returns for follow-up. She has completed chemo and she reports her energy and appetite has gotten back to normal. Anemia has resolved and her labs look good.  MEDICAL HISTORY:  Past Medical History:  Diagnosis Date  . Adenocarcinoma of descending colon (HCC) 12/2015   Flex sigmoidoscopy biopsy of descending colon mass is adenocarcinoma/notes 01/18/2016  . Anemia   . Cancer [facility])    Hodgkin's lymphoma- Dr.  Jansen Sciuto  . Depression   . Family history of kidney cancer   . Hodgkin lymphoma ([place]) dx'd 01/1995  . Large bowel obstruction s/p colectomy/colostomy Aug 2017 01/16/2016  . Migraine    "maybe once/month" (01/18/2016)  . Type II diabetes mellitus ([place]) 06/2015    SURGICAL HISTORY: Past Surgical History:  Procedure Laterality Date  . COLON RESECTION N/A 01/18/2016   Procedure: OPEN PARTIAL COLECTOMY WITH  END COLOSTOMY;  Surgeon: Mickeal Skinner, MD;  Location: [facility];  Service: General;  Laterality: N/A;  . COLOSTOMY  01/18/2016  . COLOSTOMY TAKEDOWN N/A 05/11/2016   Procedure: LAPAROSCOPIC CONVERTED TO OPEN REVERSAL OF END COLOSTOMY;  Surgeon: Arta Bruce Kinsinger, MD;  Location: [facility];  Service: General;  Laterality: N/A;  . DILATION AND CURETTAGE OF UTERUS    . FLEXIBLE SIGMOIDOSCOPY N/A 01/17/2016   Procedure: FLEXIBLE SIGMOIDOSCOPY;  Surgeon: Ladene Artist, MD;  Location: [facility] ENDOSCOPY;  Service: Endoscopy;  Laterality: N/A;  . LYMPH NODE BIOPSY  1996  . MANDIBLE SURGERY  1988  . PARTIAL COLECTOMY  01/18/2016  . PROCTOSCOPY  05/11/2016   Procedure: PROCTOSCOPY;  Surgeon: Mickeal Skinner, MD;  Location: [facility];  Service: General;;    SOCIAL HISTORY: Social History   Social History  . Marital status: Single    Spouse name: N/A  . Number of children: 0  . Years of education: N/A   Occupational History  . Professor     [employer]   Social History Main Topics  . Smoking status: Never Smoker  . Smokeless tobacco: Never Used  . Alcohol use Yes     Comment: rare social  . Drug use: No  . Sexual activity: Not Currently   Other Topics Concern  . 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 Diabetes Mother   . Cancer Father     prostate cancer  . Cancer Paternal Uncle     renal cancer ; smoker  . Diabetes Maternal Grandmother   . Diabetes Maternal Grandfather   . Clotting disorder Paternal Grandmother   . COPD Paternal Grandfather     ALLERGIES:  is allergic to sulfa antibiotics.  MEDICATIONS:  Current Outpatient Prescriptions  Medication Sig Dispense Refill  . ferrous sulfate 325 (65 FE) MG tablet Take 325  mg by mouth daily with breakfast.    . HYDROcodone-acetaminophen (NORCO/VICODIN) 5-325 MG tablet Take 1-2 tablets by mouth every 6 (six) hours as needed for moderate pain. 30 tablet 0  . ibuprofen (ADVIL,MOTRIN) 800 MG tablet Take 1 tablet (800 mg total) by mouth every 8 (eight) hours as needed. 30 tablet 0  . metFORMIN (GLUCOPHAGE-XR) 500 MG 24 hr tablet Take 500 mg by mouth 2 (two) times daily.  0  . ondansetron (ZOFRAN) 8 MG tablet Take 1 tablet (8 mg total) by mouth 2 (two) times daily as needed for refractory nausea / vomiting. Start on day 3 after chemotherapy. 30 tablet 1  . potassium chloride SA (K-DUR,KLOR-CON) 20 MEQ tablet Take 1 tablet (20 mEq total) by mouth daily. 40 tablet 1  . prochlorperazine (COMPAZINE) 10 MG tablet Take 1 tablet (10 mg total) by mouth every 6 (six) hours as needed (Nausea or  vomiting). 30 tablet 1  . venlafaxine XR (EFFEXOR-XR) 75 MG 24 hr capsule Take 225 mg by mouth at bedtime.  5   No current facility-administered medications for this visit.     REVIEW OF SYSTEMS:   Constitutional: Denies fevers, chills or abnormal night sweats (+) improved fatigue Eyes: Denies blurriness of vision, double vision or watery eyes Ears, nose, mouth, throat, and face: Denies mucositis or sore throat Respiratory: Denies cough, dyspnea or wheezes Cardiovascular: Denies palpitation, chest discomfort or lower extremity swelling Gastrointestinal:  Denies nausea, heartburn or change in bowel habits Skin: Denies abnormal skin rashes Lymphatics: Denies new lymphadenopathy or easy bruising Neurological:Denies numbness, tingling or new weaknesses Behavioral/Psych: Mood is stable, no new changes  All other systems were reviewed with the patient and are negative.  PHYSICAL EXAMINATION:  ECOG PERFORMANCE STATUS: 0  Vitals:   08/10/16 0916  BP: 134/89  Pulse: 94  Resp: 18  Temp: 98.4 F (36.9 C)   Filed Weights   08/10/16 0916  Weight: 164 lb 6.4 oz (74.6 kg)     GENERAL:alert, no distress and comfortable SKIN: skin color, texture, turgor are normal, no rashes or significant lesions EYES: normal, conjunctiva are pink and non-injected, sclera clear OROPHARYNX:no exudate, no erythema and lips, buccal mucosa, and tongue normal  NECK: supple, thyroid normal size, non-tender, without nodularity LYMPH:  no palpable lymphadenopathy in the cervical, axillary or inguinal LUNGS: clear to auscultation and percussion with normal breathing effort HEART: regular rate & rhythm and no murmurs and no lower extremity edema ABDOMEN:abdomen soft, non-tender and normal bowel sounds, Surgical scar has well-healed, no tenderness Musculoskeletal:no cyanosis of digits and no clubbing  PSYCH: alert & oriented x 3 with fluent speech NEURO: no focal motor/sensory deficits  LABORATORY DATA:  I have reviewed the data as listed CBC Latest Ref Rng & Units 08/07/2016 06/09/2016 05/15/2016  WBC 3.9 - 10.3 10e3/uL 8.3 5.7 -  Hemoglobin 11.6 - 15.9 g/dL 12.9 11.1(L) 9.5(L)  Hematocrit 34.8 - 46.6 % 38.5 32.8(L) -  Platelets 145 - 400 10e3/uL 237 171 -   CMP Latest Ref Rng & Units 08/07/2016 06/09/2016 05/15/2016  Glucose 70 - 140 mg/dl 142(H) 197(H) -  BUN 7.0 - 26.0 mg/dL 13.5 8.3 -  Creatinine 0.6 - 1.1 mg/dL 0.8 0.8 0.48  Sodium 136 - 145 mEq/L 140 141 -  Potassium 3.5 - 5.1 mEq/L 4.3 2.9(LL) 2.9(L)  Chloride 101 - 111 mmol/L - - -  CO2 22 - 29 mEq/L 20(L) 22 -  Calcium 8.4 - 10.4 mg/dL 9.6 9.6 -  Total Protein 6.4 - 8.3 g/dL 8.4(H) 7.7 -  Total Bilirubin 0.20 - 1.20 mg/dL 0.64 0.92 -  Alkaline Phos 40 - 150 U/L 100 88 -  AST 5 - 34 U/L 24 15 -  ALT 0 - 55 U/L 20 7 -     PATHOLOGY REPORT  Diagnosis 01/18/2016 Colon, segmental resection for tumor, Left - INVASIVE ADENOCARCINOMA, WELL DIFFERENTIATED, SPANNING 4.1 CM. - ADENOCARCINOMA EXTENDS INTO PERICOLONIC SOFT TISSUE. - LYMPHOVASCULAR INVASION IS IDENTIFIED. - THE SURGICAL RESECTION MARGINS ARE NEGATIVE FOR  CARCINOMA. - METASTATIC CARCINOMA IN 1 OF 27 LYMPH NODES (1/27). - DIVERTICULAR DISEASE (GROSS DIAGNOSIS). - SEE ONCOLOGY TABLE BELOW.   Diagnosis 01/17/2016 Colon, biopsy, descending - POSITIVE FOR ADENOCARCINOMA.  RADIOGRAPHIC STUDIES: I have personally reviewed the radiological images as listed and agreed with the findings in the report. Ct Abdomen Pelvis W Contrast  Result Date: 08/07/2016 CLINICAL DATA:  Restaging colon cancer EXAM: CT ABDOMEN  AND PELVIS WITH CONTRAST TECHNIQUE: Multidetector CT imaging of the abdomen and pelvis was performed using the standard protocol following bolus administration of intravenous contrast. CONTRAST:  165m ISOVUE-300 IOPAMIDOL (ISOVUE-300) INJECTION 61% COMPARISON:  01/16/2016 FINDINGS: Lower chest: No acute abnormality. Hepatobiliary: No suspicious liver abnormality. The gallbladder appears normal. No biliary dilatation. Pancreas: Normal appearance of the pancreas. Spleen: The spleen is unremarkable. Adrenals/Urinary Tract: The adrenal glands are both normal. Normal appearance of both kidneys. No hydronephrosis or mass. Urinary bladder appears normal. Stomach/Bowel: The stomach is unremarkable. No pathologic dilatation of the large or small bowel loops. The colocolonic anastomosis within the left lower quadrant of the abdomen is unremarkable. Vascular/Lymphatic: Normal appearance of the abdominal aorta. No enlarged lymph nodes within the upper abdomen. No pelvic or inguinal adenopathy identified. Reproductive: Subserosal and exophytic fibroid arising from the uterine fundus measures 3 x 2.0 cm, image 65 of series 6. Other: No ascites. There is a soft tissue nodule within the scratch set soft tissue peritoneal nodule within the lower abdomen centrally measures 1.7 cm, image 52 of series 2. Musculoskeletal: Degenerative disc disease identified within the lower thoracic spine. No aggressive lytic or sclerotic bone lesions. IMPRESSION: 1. No complications identified  status post partial colectomy. 2. There is a soft tissue attenuating nodule within the peritoneal cavity which is indeterminate. Although this may represent postsurgical change peritoneal metastasis cannot be excluded and close interval follow-up is advised. Electronically Signed   By: TKerby MoorsM.D.   On: 08/07/2016 12:38   Sigmoidoscopy 01/17/2016 - Malignant appearing, obstructing tumor in the descending colon. Biopsied. - Otherwise normal flex sig however limited by poor prep.  CT Chest wo contrast 02/10/2016 IMPRESSION: 1. In both upper lobes there are single 3 mm ground-glass density nodules. No follow-up needed if patient is low-risk (and has no known or suspected primary neoplasm). Non-contrast chest CT can be considered in 12 months if patient is high-risk. This recommendation follows the consensus statement: Guidelines for Management of Incidental Pulmonary Nodules Detected on CT Images: From the Fleischner Society 2017; Radiology 2017; 284:228-243. 2. Focal thickening or small fluid density lesion along the left posteromedial hemidiaphragm, not changed from 2008, considered benign. 3. Thoracic spondylosis. 4. No characteristic findings for metastatic disease to the chest.   CT Abdomen/Pelvis with contrast 08/07/16 IMPRESSION: 1. No complications identified status post partial colectomy. 2. There is a soft tissue attenuating nodule within the peritoneal cavity which is indeterminate. Although this may represent postsurgical change peritoneal metastasis cannot be excluded and close interval follow-up is advised.  ASSESSMENT & PLAN:  46year old with past medical history of diabetes, remote history of Hodgkin lymphoma, presented with bowel obstruction.  1. Cancer of left colon, invasive adenocarcinoma, well-differentiated, pT3N1aM0, stage IIIB, MSI-stable  -I previously reviewed her surgical pathology findings with patient and her sisters in great details -I previously  reviewed her staging CT scans which showed no evidence of metastasis. -We previously discussed risk of cancer recurrence after complete surgical resection. Given her positive lymph nodes, stage IIIB disease, LVI (+), she is at high risk for recurrence.  -she completed adjuvant chemotherapy CAPOX well, a total of 3 months treatment -She is clinically doing well, laboratory results reviewed with her, exam is unremarkable. No clinical concerns for recurrence. -  CT scan from3/12/18 revealed a new 1.7 cm soft tissue mass in the peritoneum, possible scar tissue versus peritoneal metastasis. I reviewed his images with patient in person.  I'll obtain a close follow-up CT of the abdomen and pelvis  in 2 months to monitor  - I have advised the patient to call me if she experiences in pain or change of bowel habits.  -We will continue colon cancer surveillance. I'll see her back in 2 months with a CT scan next.  2. Iron deficient anemia -Her iron study showed iron deficiency, probably from her previous GI bleeding from her colon cancer. -We previously discussed the treatment option of oral iron supplement versus IV iron. She will start ferrous sulfate 1-2 tablets a day, with vitamin C or orange juice.  If she has poor response or tolerance to oral iron, I'll consider IV iron. -Her anemia has resolved  3. 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  This deletion is likely pathogenic with respect to EPCAM function, but it IS NOT expected to cause Lynch syndrome.  Two pathogenic mutations in EPCAM can cause a rare autosomal recessive condition called congenital diarrhea with tufting  enteropathy (CTE).  The Colorectal Cancer Panel offered by [facility] includes sequencing and/or duplication/deletion testing of the following 19 genes: APC, ATM, AXIN2, BMPR1A, CDH1, CHEK2, EPCAM, MLH1, MSH2, MSH6, MUTYH, PMS2, POLD1, POLE, PTEN, SCG5/GREM1, SMAD4, STK11, and TP53. The report date is March 27, 2016.  4. DM -She was diagnosed with diabetes in earlier 2017. She is on metformin -I encouraged her to monitor her blood sugar at home -She'll continue follow-up with her PCP  5. Hypokalemia -resolved, she is on once daily, OK to stop when she completes   PLAN  - f/u scan in 2 months for f/u. Lab and CT abd/pel w contrast a few days before    All questions were answered. The patient knows to call the clinic with any problems, questions or concerns. I spent 20 minutes counseling the patient face to face. The total time spent in the appointment was 25 minutes and more than 50% was on counseling.  This document serves as a record of services personally performed by Truitt Merle, MD. It was created on her behalf by Brandt Loosen, a trained medical scribe. The creation of this record is based on the scribe's personal observations and the provider's statements to them. This document has been checked and approved by the attending provider.     Truitt Merle, MD 08/10/2016

## 2016-08-10 ENCOUNTER — Telehealth: Payer: Self-pay | Admitting: Hematology

## 2016-08-10 ENCOUNTER — Encounter: Payer: Self-pay | Admitting: Hematology

## 2016-08-10 ENCOUNTER — Ambulatory Visit (HOSPITAL_BASED_OUTPATIENT_CLINIC_OR_DEPARTMENT_OTHER): Payer: BLUE CROSS/BLUE SHIELD | Admitting: Hematology

## 2016-08-10 VITALS — BP 134/89 | HR 94 | Temp 98.4°F | Resp 18 | Ht 64.5 in | Wt 164.4 lb

## 2016-08-10 DIAGNOSIS — D500 Iron deficiency anemia secondary to blood loss (chronic): Secondary | ICD-10-CM

## 2016-08-10 DIAGNOSIS — C186 Malignant neoplasm of descending colon: Secondary | ICD-10-CM

## 2016-08-10 DIAGNOSIS — E118 Type 2 diabetes mellitus with unspecified complications: Secondary | ICD-10-CM

## 2016-08-10 NOTE — Telephone Encounter (Signed)
Patient was given 2 bottles of contrast per 08/10/16 los. Appointments scheduled per 08/10/16 los. Patient was given a copy of the AVS report and appointment schedule, per 08/10/16 los.

## 2016-08-23 ENCOUNTER — Encounter: Payer: Self-pay | Admitting: Gastroenterology

## 2016-10-02 ENCOUNTER — Ambulatory Visit (HOSPITAL_COMMUNITY)
Admission: RE | Admit: 2016-10-02 | Discharge: 2016-10-02 | Disposition: A | Discharge status: Home / Self Care | Payer: BLUE CROSS/BLUE SHIELD | Source: Ambulatory Visit | Attending: Hematology | Admitting: Hematology

## 2016-10-02 ENCOUNTER — Other Ambulatory Visit (HOSPITAL_BASED_OUTPATIENT_CLINIC_OR_DEPARTMENT_OTHER): Payer: BLUE CROSS/BLUE SHIELD

## 2016-10-02 DIAGNOSIS — C186 Malignant neoplasm of descending colon: Secondary | ICD-10-CM | POA: Diagnosis not present

## 2016-10-02 DIAGNOSIS — I700 Atherosclerosis of aorta: Secondary | ICD-10-CM | POA: Diagnosis not present

## 2016-10-02 DIAGNOSIS — R1909 Other intra-abdominal and pelvic swelling, mass and lump: Secondary | ICD-10-CM | POA: Diagnosis not present

## 2016-10-02 DIAGNOSIS — R161 Splenomegaly, not elsewhere classified: Secondary | ICD-10-CM | POA: Diagnosis not present

## 2016-10-02 LAB — CBC WITH DIFFERENTIAL/PLATELET
BASO%: 0.8 % (ref 0.0–2.0)
BASOS ABS: 0.1 10*3/uL (ref 0.0–0.1)
EOS%: 1.9 % (ref 0.0–7.0)
Eosinophils Absolute: 0.2 10*3/uL (ref 0.0–0.5)
HCT: 38.3 % (ref 34.8–46.6)
HGB: 12.9 g/dL (ref 11.6–15.9)
LYMPH%: 27.6 % (ref 14.0–49.7)
MCH: 27.7 pg (ref 25.1–34.0)
MCHC: 33.7 g/dL (ref 31.5–36.0)
MCV: 82.4 fL (ref 79.5–101.0)
MONO#: 0.7 10*3/uL (ref 0.1–0.9)
MONO%: 7.9 % (ref 0.0–14.0)
NEUT#: 5.3 10*3/uL (ref 1.5–6.5)
NEUT%: 61.8 % (ref 38.4–76.8)
Platelets: 263 10*3/uL (ref 145–400)
RBC: 4.66 10*6/uL (ref 3.70–5.45)
RDW: 16 % — AB (ref 11.2–14.5)
WBC: 8.6 10*3/uL (ref 3.9–10.3)
lymph#: 2.4 10*3/uL (ref 0.9–3.3)

## 2016-10-02 LAB — COMPREHENSIVE METABOLIC PANEL
ALT: 18 U/L (ref 0–55)
AST: 15 U/L (ref 5–34)
Albumin: 3.8 g/dL (ref 3.5–5.0)
Alkaline Phosphatase: 98 U/L (ref 40–150)
Anion Gap: 14 mEq/L — ABNORMAL HIGH (ref 3–11)
BUN: 9.5 mg/dL (ref 7.0–26.0)
CHLORIDE: 106 meq/L (ref 98–109)
CO2: 18 meq/L — AB (ref 22–29)
CREATININE: 0.8 mg/dL (ref 0.6–1.1)
Calcium: 9.2 mg/dL (ref 8.4–10.4)
EGFR: >90 mL/min/{1.73_m2} (ref >90)
Glucose: 202 mg/dl — ABNORMAL HIGH (ref 70–140)
POTASSIUM: 3.6 meq/L (ref 3.5–5.1)
SODIUM: 139 meq/L (ref 136–145)
Total Bilirubin: 0.39 mg/dL (ref 0.20–1.20)
Total Protein: 7.8 g/dL (ref 6.4–8.3)

## 2016-10-02 MED ORDER — IOPAMIDOL (ISOVUE-300) INJECTION 61%
INTRAVENOUS | Status: AC
  Administered 2016-10-02: 100 mL
  Filled 2016-10-02: qty 100

## 2016-10-04 NOTE — Progress Notes (Signed)
[place]  [phone] [phone]  Clinic Follow up Note   Patient Care Team: Donald Prose, MD as PCP - General (Family Medicine) Truitt Merle, MD as Consulting Physician (Hematology) Kinsinger, Arta Bruce, MD as Consulting Physician (General Surgery) 10/05/2016   CHIEF COMPLAINTS:  Follow up stage IIIB colon cancer   Oncology History   Cancer of left colon [facility])   Staging form: Colon and Rectum, AJCC 7th Edition   - Pathologic stage from 01/18/2016: Stage IIIB (T3, N1a, cM0) - Signed by Truitt Merle, MD on 02/01/2016      Cancer of left colon s/p colostomy takedown 05/11/2016   01/17/2016 Initial Diagnosis    Cancer of left colon ([place])      01/17/2016 Procedure    Flexible sigmoidoscopy showed malignant-appearing obstructing tumor in the descending colon, biopsied.      01/18/2016 Surgery    Patient underwent left hemicolectomy       01/18/2016 Pathology Results    Left hemicolectomy showed invasive adenocarcinoma, well differentiated, 4.1 cm, tumor extends into pericolonic soft tissue, lymphovascular invasion is identified, margins were negative, 1 out of 27 lymph nodes were positive.      02/24/2016 - 04/28/2016 Chemotherapy    Adjuvant CAPOX (Xeloda on day 1-14, oxaliplatin on day 1) every 3 weeks, for total of 4 cycles       08/07/2016 Imaging    CT A/P w contrast  IMPRESSION: 1. No complications identified status post partial colectomy. 2. There is a soft tissue attenuating nodule within the peritoneal cavity which is indeterminate. Although this may represent postsurgical change peritoneal metastasis cannot be excluded and close interval follow-up is advised.      10/02/2016 Imaging    CT Abdomen/Pelvis with contrast 10/02/16 IMPRESSION: 1. Partial regression of previously suspected soft tissue nodule in the anterior aspect of the peritoneal cavity. Given the spontaneous regression since March 2018, in the absence of chemotherapy  (last administered in December 2017), this is presumably a benign area of resolving postoperative scarring. No other suspicious findings are noted in the abdomen or pelvis on today's examination to suggest metastatic disease. 2. Borderline splenomegaly. 3. Probable mild hepatic steatosis. 4. Aortic atherosclerosis (mild). 5. Additional incidental findings, similar to prior examinations, as above.        HISTORY OF PRESENTING ILLNESS (02/01/2016):  Taylor Whitney 46 y.o. female is here because of her recently diagnosed stage IIIB colon cancer, she presents to my clinic by herself, and her two sisters were on the phone during her visit.   She presents to [facility] hospital on 01/16/2016 with nausea and vomiting for one day. She had a CT scan and flexible sigmoidoscopy which showed a near obstructing mass in the descending colon. She was seen by surgeon Dr. Kieth Brightly and underwent left colectomy and end colostomy. There were minor injury to the duodenum during her surgery, postop study showed no leakage from duodenum. She tolerated surgery and recovered well, was discharged home after 9 days of hospital stay.  She has been recovering well from surgery, has mimimum pain at incisions, her appetite is still relatively low, her energy level has improved, she table to function and tolerate daily activities without much difficulty. She lost about 20lbs in the past few months.  She was diagnosed with DM 6 months ago, on metformin, does not check her blood glucose at home.  She was diagnosed with HL stage III in 1996, she was diagnosed and treated in [place], s/p chemo ABVD 9  months (bleomycin was held earlier due to pulmonary toxicities), no radiation. She followed with her oncologist for 5 years after treatment.   CURRENT THERAPY: Surveillance  INTERIM HISTORY: Taylor Whitney returns for follow-up. She reports to the clinic today. 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 She has completed chemo and she reports her energy and appetite has gotten back to normal. Anemia has resolved and her labs look good. Her sugar is high and she had breakfast before coming. She used to take Metformin twice a day but she lost weight and PCP reduced her to once a day. She reported she received letter that she is due for another colonoscopy. She will do this year.    MEDICAL HISTORY:  Past Medical History:  Diagnosis Date  . Adenocarcinoma of descending colon ([place]) 12/2015   Flex sigmoidoscopy biopsy of descending colon mass is adenocarcinoma/notes 01/18/2016  . Anemia   . Cancer [facility])    Hodgkin's lymphoma- Dr. Burr Medico  . Depression   . Family history of kidney cancer   . Hodgkin lymphoma ([place]) dx'd 01/1995  . Large bowel obstruction s/p colectomy/colostomy Aug 2017 01/16/2016  . Migraine    "maybe once/month" (01/18/2016)  . Type II diabetes mellitus ([place]) 06/2015    SURGICAL HISTORY: Past Surgical History:  Procedure Laterality Date  . COLON RESECTION N/A 01/18/2016   Procedure: OPEN PARTIAL COLECTOMY WITH END COLOSTOMY;  Surgeon: Mickeal Skinner, MD;  Location: [place];  Service: General;  Laterality: N/A;  . COLOSTOMY  01/18/2016  . COLOSTOMY TAKEDOWN N/A 05/11/2016   Procedure: LAPAROSCOPIC CONVERTED TO OPEN REVERSAL OF END COLOSTOMY;  Surgeon: Arta Bruce Kinsinger, MD;  Location: [facility];  Service: General;  Laterality: N/A;  . DILATION AND CURETTAGE OF UTERUS    . FLEXIBLE SIGMOIDOSCOPY N/A 01/17/2016   Procedure: FLEXIBLE SIGMOIDOSCOPY;  Surgeon: Ladene Artist, MD;  [facility];  Service: Endoscopy;  Laterality: N/A;  . LYMPH NODE BIOPSY  1996  . MANDIBLE SURGERY  1988  . PARTIAL COLECTOMY  01/18/2016  . PROCTOSCOPY  05/11/2016   Procedure: PROCTOSCOPY;  Surgeon: Mickeal Skinner, MD;  Location: [facility];  Service: General;;    SOCIAL HISTORY: Social History   Social History  . Marital status: Single    Spouse name: N/A  . Number of  children: 0  . Years of education: N/A   Occupational History  . Professor     [employer]   Social History Main Topics  . Smoking status: Never Smoker  . Smokeless tobacco: Never Used  . Alcohol use Yes     Comment: rare social  . Drug use: No  . Sexual activity: Not Currently   Other Topics Concern  . 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 Diabetes Mother   . Cancer Father        prostate cancer  . Cancer Paternal Uncle        renal cancer ; smoker  . Diabetes Maternal Grandmother   . Diabetes Maternal Grandfather   . Clotting disorder Paternal Grandmother   . COPD Paternal Grandfather     ALLERGIES:  is allergic to sulfa  antibiotics.  MEDICATIONS:  Current Outpatient Prescriptions  Medication Sig Dispense Refill  . ferrous sulfate 325 (65 FE) MG tablet Take 325 mg by mouth daily with breakfast.    . HYDROcodone-acetaminophen (NORCO/VICODIN) 5-325 MG tablet Take 1-2 tablets by mouth every 6 (six) hours as needed for moderate pain. 30 tablet 0  . ibuprofen (ADVIL,MOTRIN) 800 MG tablet Take 1 tablet (800 mg total) by mouth every 8 (eight) hours as needed. 30 tablet 0  . metFORMIN (GLUCOPHAGE-XR) 500 MG 24 hr tablet Take 500 mg by mouth 2 (two) times daily.  0  . potassium chloride SA (K-DUR,KLOR-CON) 20 MEQ tablet Take 1 tablet (20 mEq total) by mouth daily. 40 tablet 1  . venlafaxine XR (EFFEXOR-XR) 75 MG 24 hr capsule Take 225 mg by mouth at bedtime.  5   No current facility-administered medications for this visit.     REVIEW OF SYSTEMS:   Constitutional: Denies fevers, chills or abnormal night sweats (+) improved fatigue (+) weight gain Eyes: Denies blurriness of vision, double vision or watery eyes Ears, nose,  mouth, throat, and face: Denies mucositis or sore throat Respiratory: Denies cough, dyspnea or wheezes Cardiovascular: Denies palpitation, chest discomfort or lower extremity swelling Gastrointestinal:  Denies nausea, heartburn or change in bowel habits Skin: Denies abnormal skin rashes Lymphatics: Denies new lymphadenopathy or easy bruising Neurological:Denies numbness, tingling or new weaknesses Behavioral/Psych: Mood is stable, no new changes  All other systems were reviewed with the patient and are negative.  PHYSICAL EXAMINATION:  ECOG PERFORMANCE STATUS: 0  Vitals:   10/05/16 1011  BP: 139/86  Pulse: 93  Resp: 18  Temp: 98.6 F (37 C)   Filed Weights   10/05/16 1011  Weight: 171 lb 3.2 oz (77.7 kg)    GENERAL:alert, no distress and comfortable SKIN: skin color, texture, turgor are normal, no rashes or significant lesions  EYES: normal, conjunctiva are pink and non-injected, sclera clear OROPHARYNX:no exudate, no erythema and lips, buccal mucosa, and tongue normal  NECK: supple, thyroid normal size, non-tender, without nodularity LYMPH:  no palpable lymphadenopathy in the cervical, axillary or inguinal LUNGS: clear to auscultation and percussion with normal breathing effort HEART: regular rate & rhythm and no murmurs and no lower extremity edema ABDOMEN:abdomen soft, non-tender and normal bowel sounds, Surgical scar has well-healed, no tenderness Musculoskeletal:no cyanosis of digits and no clubbing  PSYCH: alert & oriented x 3 with fluent speech NEURO: no focal motor/sensory deficits  LABORATORY DATA:  I have reviewed the data as listed CBC Latest Ref Rng & Units 10/02/2016 08/07/2016 06/09/2016  WBC 3.9 - 10.3 10e3/uL 8.6 8.3 5.7  Hemoglobin 11.6 - 15.9 g/dL 12.9 12.9 11.1(L)  Hematocrit 34.8 - 46.6 % 38.3 38.5 32.8(L)  Platelets 145 - 400 10e3/uL 263 237 171   CMP Latest Ref Rng & Units 10/02/2016 08/07/2016 06/09/2016  Glucose 70 - 140 mg/dl 202(H) 142(H) 197(H)  BUN  7.0 - 26.0 mg/dL 9.5 13.5 8.3  Creatinine 0.6 - 1.1 mg/dL 0.8 0.8 0.8  Sodium 136 - 145 mEq/L 139 140 141  Potassium 3.5 - 5.1 mEq/L 3.6 4.3 2.9(LL)  Chloride 101 - 111 mmol/L - - -  CO2 22 - 29 mEq/L 18(L) 20(L) 22  Calcium 8.4 - 10.4 mg/dL 9.2 9.6 9.6  Total Protein 6.4 - 8.3 g/dL 7.8 8.4(H) 7.7  Total Bilirubin 0.20 - 1.20 mg/dL 0.39 0.64 0.92  Alkaline Phos 40 - 150 U/L 98 100 88  AST 5 - 34 U/L '15 24 15  ' ALT  0 - 55 U/L '18 20 7   ' Results for Taylor Whitney, Taylor Whitney (MRN [id number]) as of 10/05/2016 10:18  Ref. Range 02/01/2016 13:48 04/28/2016 08:30 08/07/2016 09:06  CE[facility] ([facility]) Latest Ref Range: 0.00 - 5.00 ng/mL <1.00 3.11 1.59    PATHOLOGY REPORT  Diagnosis 01/18/2016 Colon, segmental resection for tumor, Left - INVASIVE ADENOCARCINOMA, WELL DIFFERENTIATED, SPANNING 4.1 CM. - ADENOCARCINOMA EXTENDS INTO PERICOLONIC SOFT TISSUE. - LYMPHOVASCULAR INVASION IS IDENTIFIED. - THE SURGICAL RESECTION MARGINS ARE NEGATIVE FOR CARCINOMA. - METASTATIC CARCINOMA IN 1 OF 27 LYMPH NODES (1/27). - DIVERTICULAR DISEASE (GROSS DIAGNOSIS). - SEE ONCOLOGY TABLE BELOW.   Diagnosis 01/17/2016 Colon, biopsy, descending - POSITIVE FOR ADENOCARCINOMA.  RADIOGRAPHIC STUDIES: I have personally reviewed the radiological images as listed and agreed with the findings in the report. Ct Abdomen Pelvis W Contrast  Result Date: 10/02/2016 CLINICAL DATA:  46 year old female with history of colon cancer diagnosed in August 2017 status post resection with colostomy status post reversal. Chemotherapy completed in December 2017. Known intraperitoneal nodule. Followup study. EXAM: CT ABDOMEN AND PELVIS WITH CONTRAST TECHNIQUE: Multidetector CT imaging of the abdomen and pelvis was performed using the standard protocol following bolus administration of intravenous contrast. CONTRAST:  <See Chart> ISOVUE-300 IOPAMIDOL (ISOVUE-300) INJECTION 61% COMPARISON:  CT the abdomen and pelvis 08/07/2016. FINDINGS: Lower  chest: In the medial aspect of the lower left hemithorax abutting the left hemidiaphragm there is again a well-circumscribed 3.5 x 1.0 cm soft tissue attenuation lesion which is very similar on prior examinations dating back to 2008, considered benign, presumably a solitary fibrous tumor of the pleura. Hepatobiliary: Diffuse low attenuation throughout the hepatic parenchyma, suggestive of underlying hepatic steatosis (difficult to say for certain on today's contrast enhanced CT examination). No suspicious cystic or solid hepatic lesions. No intra or extrahepatic biliary ductal dilatation. Gallbladder is normal in appearance. Pancreas: No pancreatic mass. No pancreatic ductal dilatation. No pancreatic or peripancreatic fluid or inflammatory changes. Spleen: Spleen is borderline enlarged measuring 14.0 x 4.3 x 9.4 cm (estimated splenic volume of 283 mL). Adrenals/Urinary Tract: 11 mm low-attenuation lesion in the interpolar region of the left kidney posteriorly is compatible with a simple cyst. Right kidney and bilateral adrenal glands are normal in appearance. No hydroureteronephrosis. Urinary bladder is normal in appearance. Stomach/Bowel: The appearance of the stomach is normal. There is no pathologic dilatation of small bowel or colon. Postoperative changes of left hemicolectomy are noted, within an anastomotic suture line in the lower left hemipelvis. Normal appendix. Vascular/Lymphatic: Aortic atherosclerosis, without evidence of aneurysm or dissection in the abdominal or pelvic vasculature. No lymphadenopathy noted in the abdomen or pelvis. Reproductive: Exophytic subserosal fibroid extending from the fundus of the uterus is similar to prior studies measuring 2.7 x 3.3 x 4.1 cm. Ovaries are unremarkable in appearance. Other: Previously noted soft tissue nodule in the the fat in the anterior aspect of the peritoneal cavity is less well-defined and smaller than the prior study, currently measuring only 8 mm  (axial image 53 of series 2), suggesting an area of resolving postoperative scarring. Given the lack of chemotherapy since December 2017, the regression of this nodule is not compatible with a malignant nodule. No other definite suspicious appearing intraperitoneal nodules or masses are noted. No significant volume of ascites. No pneumoperitoneum. Musculoskeletal: There are no aggressive appearing lytic or blastic lesions noted in the visualized portions of the skeleton. IMPRESSION: 1. Partial regression of previously suspected soft tissue nodule in the anterior aspect of the peritoneal cavity. Given the  spontaneous regression since March 2018, in the absence of chemotherapy (last administered in December 2017), this is presumably a benign area of resolving postoperative scarring. No other suspicious findings are noted in the abdomen or pelvis on today's examination to suggest metastatic disease. 2. Borderline splenomegaly. 3. Probable mild hepatic steatosis. 4. Aortic atherosclerosis (mild). 5. Additional incidental findings, similar to prior examinations, as above. Electronically Signed   By: Vinnie Langton M.D.   On: 10/02/2016 16:51   Sigmoidoscopy 01/17/2016 - Malignant appearing, obstructing tumor in the descending colon. Biopsied. - Otherwise normal flex sig however limited by poor prep.  CT Chest wo contrast 02/10/2016 IMPRESSION: 1. In both upper lobes there are single 3 mm ground-glass density nodules. No follow-up needed if patient is low-risk (and has no known or suspected primary neoplasm). Non-contrast chest CT can be considered in 12 months if patient is high-risk. This recommendation follows the consensus statement: Guidelines for Management of Incidental Pulmonary Nodules Detected on CT Images: From the Fleischner Society 2017; Radiology 2017; 284:228-243. 2. Focal thickening or small fluid density lesion along the left posteromedial hemidiaphragm, not changed from 2008,  considered benign. 3. Thoracic spondylosis. 4. No characteristic findings for metastatic disease to the chest.   CT Abdomen/Pelvis with contrast 08/07/16 IMPRESSION: 1. No complications identified status post partial colectomy. 2. There is a soft tissue attenuating nodule within the peritoneal cavity which is indeterminate. Although this may represent postsurgical change peritoneal metastasis cannot be excluded and close interval follow-up is advised.  CT Abdomen/Pelvis with contrast 10/02/16 IMPRESSION: 1. Partial regression of previously suspected soft tissue nodule in the anterior aspect of the peritoneal cavity. Given the spontaneous regression since March 2018, in the absence of chemotherapy (last administered in December 2017), this is presumably a benign area of resolving postoperative scarring. No other suspicious findings are noted in the abdomen or pelvis on today's examination to suggest metastatic disease. 2. Borderline splenomegaly. 3. Probable mild hepatic steatosis. 4. Aortic atherosclerosis (mild). 5. Additional incidental findings, similar to prior examinations, as above.  ASSESSMENT & PLAN:  46 y.o.  with past medical history of diabetes, remote history of Hodgkin lymphoma, presented with bowel obstruction.  1. Cancer of left colon, invasive adenocarcinoma, well-differentiated, pT3N1aM0, stage IIIB, MSI-stable  -I previously reviewed her surgical pathology findings with patient and her sisters in great details -I previously reviewed her staging CT scans which showed no evidence of metastasis. -We previously discussed risk of cancer recurrence after complete surgical resection. Given her positive lymph nodes, stage IIIB disease, LVI (+), she is at high risk for recurrence.  -she completed adjuvant chemotherapy CAPOX well, a total of 3 months treatment -She is clinically doing well, laboratory results reviewed with her, exam is unremarkable. No clinical concerns for  recurrence. - CT scan from 08/07/16 revealed a new 1.7 cm soft tissue mass in the peritoneum, possible scar tissue versus peritoneal metastasis.  -I reviewed her follow-up CT scan from 10/02/2016, which showed significantly decrease the size of the soft tissue mass in the peritoneum, likely benign. No other evidence of metastasis. -She is clinically doing very well, exam was unremarkable, lab results reviewed with her, normal CBC and CMP except hyperglycemia. No clinical concern for cancer recurrence. -Continue cancer surveillance. She will be due for colonoscopy in August 2018, I encouraged her to call Dr. Fuller Plan for appointment.  2. Iron deficient anemia -Her initial iron study showed iron deficiency, probably from her previous GI bleeding from her colon cancer. -Her anemia has resolved  3.  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  This deletion is likely pathogenic with respect to EPCAM function, but it IS NOT expected to cause Lynch syndrome.  Two pathogenic mutations in EPCAM can cause a rare autosomal recessive condition called congenital diarrhea with tufting enteropathy (CTE).  The Colorectal Cancer Panel offered by [facility] includes sequencing and/or duplication/deletion testing of the following 19 genes: APC, ATM, AXIN2, BMPR1A, CDH1, CHEK2, EPCAM, MLH1, MSH2, MSH6, MUTYH, PMS2, POLD1, POLE, PTEN, SCG5/GREM1, SMAD4, STK11, and TP53. The report date is March 27, 2016.  4. DM -She was diagnosed with diabetes in earlier 2017. She is on metformin once daily  -I previously encouraged her to monitor her blood sugar at home -She'll continue follow-up with her PCP -Her  sugar was 202 today (10/04/16) and she will go back to taking metformin twice a day.    PLAN  -lab and f/u in 3 months  -Repeat colonoscopy in August 2018, she will call dr. Fuller Plan  -CT body scan in 6 months    All questions were answered. The patient knows to call the clinic with any problems, questions or concerns. I spent 20 minutes counseling the patient face to face. The total time spent in the appointment was 25 minutes and more than 50% was on counseling.  This document serves as a record of services personally performed by Truitt Merle, MD. It was created on her behalf by Joslyn Devon, a trained medical scribe. The creation of this record is based on the scribe's personal observations and the provider's statements to them. This document has been checked and approved by the attending provider.      Truitt Merle, MD 10/05/2016

## 2016-10-05 ENCOUNTER — Ambulatory Visit (HOSPITAL_BASED_OUTPATIENT_CLINIC_OR_DEPARTMENT_OTHER): Payer: BLUE CROSS/BLUE SHIELD | Admitting: Hematology

## 2016-10-05 ENCOUNTER — Telehealth: Payer: Self-pay | Admitting: Hematology

## 2016-10-05 ENCOUNTER — Encounter: Payer: Self-pay | Admitting: Hematology

## 2016-10-05 VITALS — BP 139/86 | HR 93 | Temp 98.6°F | Resp 18 | Ht 64.5 in | Wt 171.2 lb

## 2016-10-05 DIAGNOSIS — E118 Type 2 diabetes mellitus with unspecified complications: Secondary | ICD-10-CM

## 2016-10-05 DIAGNOSIS — C186 Malignant neoplasm of descending colon: Secondary | ICD-10-CM

## 2016-10-05 DIAGNOSIS — F32A Depression, unspecified: Secondary | ICD-10-CM

## 2016-10-05 DIAGNOSIS — D500 Iron deficiency anemia secondary to blood loss (chronic): Secondary | ICD-10-CM

## 2016-10-05 DIAGNOSIS — F329 Major depressive disorder, single episode, unspecified: Secondary | ICD-10-CM

## 2016-10-05 NOTE — Telephone Encounter (Signed)
Appointments scheduled per 5.10.18 LOS. Patient given AVS report and calendars with future scheduled appointments. °

## 2016-11-26 DIAGNOSIS — H5213 Myopia, bilateral: Secondary | ICD-10-CM | POA: Diagnosis not present

## 2016-12-29 ENCOUNTER — Encounter: Payer: Self-pay | Admitting: Gastroenterology

## 2017-01-01 NOTE — Progress Notes (Signed)
[place]  [phone] Fax:([phone]) [phone]  Clinic Follow up Note   Patient Care Team: Donald Prose, MD as PCP - General (Family Medicine) Truitt Merle, MD as Consulting Physician (Hematology) Kinsinger, Arta Bruce, MD as Consulting Physician (General Surgery) 01/04/2017   CHIEF COMPLAINTS:  Follow up stage IIIB colon cancer   Oncology History   Cancer of left colon [facility])   Staging form: Colon and Rectum, AJCC 7th Edition   - Pathologic stage from 01/18/2016: Stage IIIB (T3, N1a, cM0) - Signed by Truitt Merle, MD on 02/01/2016      Cancer of left colon s/p colostomy takedown 05/11/2016   01/17/2016 Initial Diagnosis    Cancer of left colon ([address])      01/17/2016 Procedure    Flexible sigmoidoscopy showed malignant-appearing obstructing tumor in the descending colon, biopsied.      01/18/2016 Surgery    Patient underwent left hemicolectomy       01/18/2016 Pathology Results    Left hemicolectomy showed invasive adenocarcinoma, well differentiated, 4.1 cm, tumor extends into pericolonic soft tissue, lymphovascular invasion is identified, margins were negative, 1 out of 27 lymph nodes were positive.      02/24/2016 - 04/28/2016 Chemotherapy    Adjuvant CAPOX (Xeloda on day 1-14, oxaliplatin on day 1) every 3 weeks, for total of 4 cycles       08/07/2016 Imaging    CT A/P w contrast  IMPRESSION: 1. No complications identified status post partial colectomy. 2. There is a soft tissue attenuating nodule within the peritoneal cavity which is indeterminate. Although this may represent postsurgical change peritoneal metastasis cannot be excluded and close interval follow-up is advised.      10/02/2016 Imaging    CT Abdomen/Pelvis with contrast 10/02/16 IMPRESSION: 1. Partial regression of previously suspected soft tissue nodule in the anterior aspect of the peritoneal cavity. Given the spontaneous regression since March 2018, in the absence of chemotherapy  (last administered in December 2017), this is presumably a benign area of resolving postoperative scarring. No other suspicious findings are noted in the abdomen or pelvis on today's examination to suggest metastatic disease. 2. Borderline splenomegaly. 3. Probable mild hepatic steatosis. 4. Aortic atherosclerosis (mild). 5. Additional incidental findings, similar to prior examinations, as above.        HISTORY OF PRESENTING ILLNESS (02/01/2016):  Taylor Whitney 46 y.o. female is here because of her recently diagnosed stage IIIB colon cancer, she presents to my clinic by herself, and her two sisters were on the phone during her visit.   She presents to [facility] hospital on 01/16/2016 with nausea and vomiting for one day. She had a CT scan and flexible sigmoidoscopy which showed a near obstructing mass in the descending colon. She was seen by surgeon Dr. Kieth Brightly and underwent left colectomy and end colostomy. There were minor injury to the duodenum during her surgery, postop study showed no leakage from duodenum. She tolerated surgery and recovered well, was discharged home after 9 days of hospital stay.  She has been recovering well from surgery, has mimimum pain at incisions, her appetite is still relatively low, her energy level has improved, she table to function and tolerate daily activities without much difficulty. She lost about 20lbs in the past few months.  She was diagnosed with DM 6 months ago, on metformin, does not check her blood glucose at home.  She was diagnosed with HL stage III in 1996, she was diagnosed and treated in [place], s/p chemo ABVD 9  months (bleomycin was held earlier due to pulmonary toxicities), no radiation. She followed with her oncologist for 5 years after treatment.   CURRENT THERAPY: Surveillance  INTERIM HISTORY: [name] returns for follow-up. She denies any pain, nausea, abnormal bowel movements or numbness or tingling in extermities. She reduced  her Metformin dosage to once daily and has been having high blood sugar lately.Her energy level is well.   Her dentist would like a note saying she can start backing with regular appointments there.   MEDICAL HISTORY:  Past Medical History:  Diagnosis Date  . Adenocarcinoma of descending colon ([place]) 12/2015   Flex sigmoidoscopy biopsy of descending colon mass is adenocarcinoma/notes 01/18/2016  . Anemia   . Cancer [facility])    Hodgkin's lymphoma- Dr. Burr Medico  . Depression   . Family history of kidney cancer   . Hodgkin lymphoma ([place]) dx'd 01/1995  . Large bowel obstruction s/p colectomy/colostomy Aug 2017 01/16/2016  . Migraine    "maybe once/month" (01/18/2016)  . Type II diabetes mellitus ([place]) 06/2015    SURGICAL HISTORY: Past Surgical History:  Procedure Laterality Date  . COLON RESECTION N/A 01/18/2016   Procedure: OPEN PARTIAL COLECTOMY WITH END COLOSTOMY;  Surgeon: Mickeal Skinner, MD;  Location: [facility];  Service: General;  Laterality: N/A;  . COLOSTOMY  01/18/2016  . COLOSTOMY TAKEDOWN N/A 05/11/2016   Procedure: LAPAROSCOPIC CONVERTED TO OPEN REVERSAL OF END COLOSTOMY;  Surgeon: Arta Bruce Kinsinger, MD;  Location: [facility];  Service: General;  Laterality: N/A;  . DILATION AND CURETTAGE OF UTERUS    . FLEXIBLE SIGMOIDOSCOPY N/A 01/17/2016   Procedure: FLEXIBLE SIGMOIDOSCOPY;  Surgeon: Ladene Artist, MD;  Location: [facility] ENDOSCOPY;  Service: Endoscopy;  Laterality: N/A;  . LYMPH NODE BIOPSY  1996  . MANDIBLE SURGERY  1988  . PARTIAL COLECTOMY  01/18/2016  . PROCTOSCOPY  05/11/2016   Procedure: PROCTOSCOPY;  Surgeon: Mickeal Skinner, MD;  Location: [facility];  Service: General;;    SOCIAL HISTORY: Social History   Social History  . Marital status: Single    Spouse name: N/A  . Number of children: 0  . Years of education: N/A   Occupational History  . Professor     [employer]   Social History Main Topics  . Smoking status: Never Smoker  . Smokeless  tobacco: Never Used  . Alcohol use Yes     Comment: rare social  . Drug use: No  . Sexual activity: Not Currently   Other Topics Concern  . 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 Diabetes Mother   . Cancer Father        prostate cancer  . Cancer Paternal Uncle        renal cancer ; smoker  . Diabetes Maternal Grandmother   . Diabetes Maternal Grandfather   . Clotting disorder Paternal Grandmother   . COPD Paternal Grandfather     ALLERGIES:  is allergic to sulfa antibiotics.  MEDICATIONS:  Current Outpatient Prescriptions  Medication Sig Dispense Refill  . ibuprofen (ADVIL,MOTRIN) 800 MG tablet Take 1 tablet (800 mg total) by mouth every 8 (eight) hours as needed. 30 tablet 0  . metFORMIN (GLUCOPHAGE-XR) 500 MG 24  hr tablet Take 500 mg by mouth 2 (two) times daily.  0  . venlafaxine XR (EFFEXOR-XR) 75 MG 24 hr capsule Take 225 mg by mouth at bedtime.  5   No current facility-administered medications for this visit.     REVIEW OF SYSTEMS:   Constitutional: Denies fevers, chills or abnormal night sweats (+) improved fatigue  Eyes: Denies blurriness of vision, double vision or watery eyes Ears, nose, mouth, throat, and face: Denies mucositis or sore throat Respiratory: Denies cough, dyspnea or wheezes Cardiovascular: Denies palpitation, chest discomfort or lower extremity swelling Gastrointestinal:  Denies nausea, heartburn or change in bowel habits Skin: Denies abnormal skin rashes Lymphatics: Denies new lymphadenopathy or easy bruising Neurological:Denies numbness, tingling or new weaknesses Behavioral/Psych: Mood is stable, no new changes  All other systems were reviewed with the patient and are negative.  PHYSICAL EXAMINATION:    ECOG PERFORMANCE STATUS: 0  Vitals:   01/04/17 0919  BP: (!) 149/94  Pulse: 98  Resp: 20  Temp: 98.2 F (36.8 C)  SpO2: 100%   Filed Weights   01/04/17 0919  Weight: 169 lb 6.4 oz (76.8 kg)    GENERAL:alert, no distress and comfortable SKIN: skin color, texture, turgor are normal, no rashes or significant lesions  EYES: normal, conjunctiva are pink and non-injected, sclera clear OROPHARYNX:no exudate, no erythema and lips, buccal mucosa, and tongue normal  NECK: supple, thyroid normal size, non-tender, without nodularity LYMPH:  no palpable lymphadenopathy in the cervical, axillary or inguinal LUNGS: clear to auscultation and percussion with normal breathing effort HEART: regular rate & rhythm and no murmurs and no lower extremity edema ABDOMEN:abdomen soft, non-tender and normal bowel sounds, Surgical scar has well-healed, no tenderness  Musculoskeletal:no cyanosis of digits and no clubbing  PSYCH: alert & oriented x 3 with fluent speech NEURO: no focal motor/sensory deficits  LABORATORY DATA:  I have reviewed the data as listed CBC Latest Ref Rng & Units 01/04/2017 10/02/2016 08/07/2016  WBC 3.9 - 10.3 10e3/uL 8.9 8.6 8.3  Hemoglobin 11.6 - 15.9 g/dL 13.4 12.9 12.9  Hematocrit 34.8 - 46.6 % 39.2 38.3 38.5  Platelets 145 - 400 10e3/uL 224 263 237   CMP Latest Ref Rng & Units 01/04/2017 10/02/2016 08/07/2016  Glucose 70 - 140 mg/dl 175(H) 202(H) 142(H)  BUN 7.0 - 26.0 mg/dL 8.7 9.5 13.5  Creatinine 0.6 - 1.1 mg/dL 0.8 0.8 0.8  Sodium 136 - 145 mEq/L 139 139 140  Potassium 3.5 - 5.1 mEq/L 3.6 3.6 4.3  Chloride 101 - 111 mmol/L - - -  CO2 22 - 29 mEq/L 19(L) 18(L) 20(L)  Calcium 8.4 - 10.4 mg/dL 9.3 9.2 9.6  Total Protein 6.4 - 8.3 g/dL 7.6 7.8 8.4(H)  Total Bilirubin 0.20 - 1.20 mg/dL 0.74 0.39 0.64  Alkaline Phos 40 - 150 U/L 87 98 100  AST 5 - 34 U/L '19 15 24  ' ALT 0 - 55 U/L '23 18 20   ' CEA pending TODAY   PATHOLOGY REPORT  Diagnosis 01/18/2016 Colon, segmental  resection for tumor, Left - INVASIVE ADENOCARCINOMA, WELL DIFFERENTIATED, SPANNING 4.1 CM. - ADENOCARCINOMA EXTENDS INTO PERICOLONIC SOFT TISSUE. - LYMPHOVASCULAR INVASION IS IDENTIFIED. - THE SURGICAL RESECTION MARGINS ARE NEGATIVE FOR CARCINOMA. - METASTATIC CARCINOMA IN 1 OF 27 LYMPH NODES (1/27). - DIVERTICULAR DISEASE (GROSS DIAGNOSIS). - SEE ONCOLOGY TABLE BELOW.   Diagnosis 01/17/2016 Colon, biopsy, descending - POSITIVE FOR ADENOCARCINOMA.  RADIOGRAPHIC STUDIES: I have personally reviewed the radiological images as listed and agreed with the findings in  the report. No results found.  Sigmoidoscopy 01/17/2016 - Malignant appearing, obstructing tumor in the descending colon. Biopsied. - Otherwise normal flex sig however limited by poor prep.  CT Abdomen/Pelvis with contrast 08/07/16 IMPRESSION: 1. No complications identified status post partial colectomy. 2. There is a soft tissue attenuating nodule within the peritoneal cavity which is indeterminate. Although this may represent postsurgical change peritoneal metastasis cannot be excluded and close interval follow-up is advised.  CT Abdomen/Pelvis with contrast 10/02/16 IMPRESSION: 1. Partial regression of previously suspected soft tissue nodule in the anterior aspect of the peritoneal cavity. Given the spontaneous regression since March 2018, in the absence of chemotherapy (last administered in December 2017), this is presumably a benign area of resolving postoperative scarring. No other suspicious findings are noted in the abdomen or pelvis on today's examination to suggest metastatic disease. 2. Borderline splenomegaly. 3. Probable mild hepatic steatosis. 4. Aortic atherosclerosis (mild). 5. Additional incidental findings, similar to prior examinations, as above.  ASSESSMENT & PLAN:  46 y.o.  with past medical history of diabetes, remote history of Hodgkin lymphoma, presented with bowel obstruction.  1. Cancer of  left colon, invasive adenocarcinoma, well-differentiated, pT3N1aM0, stage IIIB, MSI-stable  -I previously reviewed her surgical pathology findings with patient and her sisters in great details -I previously reviewed her staging CT scans which showed no evidence of metastasis. -We previously discussed risk of cancer recurrence after complete surgical resection. Given her positive lymph nodes, stage IIIB disease, LVI (+), she is at high risk for recurrence.  -she completed adjuvant chemotherapy CAPOX and tolerated well, a total of 3 months treatment - CT scan from 08/07/16 revealed a new 1.7 cm soft tissue mass in the peritoneum, possible scar tissue versus peritoneal metastasis.  -I reviewed her follow-up CT scan from 10/02/2016, which showed significantly decrease the size of the soft tissue mass in the peritoneum, likely benign. No other evidence of metastasis. -She is clinically doing very well, exam was unremarkable, lab results reviewed with her, normal CBC and CMP except hyperglycemia. No clinical concern for cancer recurrence. -Continue cancer surveillance.  - follow up with scan in 4 months  2. Iron deficient anemia -Her initial iron study showed iron deficiency, probably from her previous GI bleeding from her colon cancer. -Her anemia has resolved  3. 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  This deletion is likely pathogenic with respect to EPCAM function, but it IS NOT expected to cause Lynch syndrome.  Two pathogenic mutations in EPCAM can cause a rare autosomal recessive condition called congenital diarrhea with tufting enteropathy (CTE).  The Colorectal Cancer  Panel offered by [facility] includes sequencing and/or duplication/deletion testing of the following 19 genes: APC, ATM, AXIN2, BMPR1A, CDH1, CHEK2, EPCAM, MLH1, MSH2, MSH6, MUTYH, PMS2, POLD1, POLE, PTEN, SCG5/GREM1, SMAD4, STK11, and TP53. The report date is March 27, 2016.  4. DM -She was diagnosed with diabetes in earlier 2017. She is on metformin once daily  -I previously encouraged her to monitor her blood sugar at home -She'll continue follow-up with her PCP -Her sugar was 175 today (01/04/2017) and she will go back to taking metformin twice a day.    PLAN -She is clinically  doing very well, exam was unremarkable, lab results reviewed with her, normal CBC and CMP except hyperglycemia. No clinical concern for cancer recurrence. -lab and f/u in 4 months with scan CT CAP with contrast a few days before    All questions were answered. The patient knows to call the clinic with any problems, questions or concerns. I spent 20 minutes counseling the patient face to face. The total time spent in the appointment was 25 minutes and more than 50% was on counseling.  This document serves as a record of services personally performed by Truitt Merle, MD. It was created on her behalf by Brandt Loosen, a trained medical scribe. The creation of this record is based on the scribe's personal observations and the provider's statements to them. This document has been checked and approved by the attending provider.  Truitt Merle 01/04/2017

## 2017-01-04 ENCOUNTER — Ambulatory Visit (HOSPITAL_BASED_OUTPATIENT_CLINIC_OR_DEPARTMENT_OTHER): Payer: BLUE CROSS/BLUE SHIELD | Admitting: Hematology

## 2017-01-04 ENCOUNTER — Other Ambulatory Visit (HOSPITAL_BASED_OUTPATIENT_CLINIC_OR_DEPARTMENT_OTHER): Payer: BLUE CROSS/BLUE SHIELD

## 2017-01-04 ENCOUNTER — Encounter: Payer: Self-pay | Admitting: Hematology

## 2017-01-04 ENCOUNTER — Telehealth: Payer: Self-pay | Admitting: Hematology

## 2017-01-04 VITALS — BP 149/94 | HR 98 | Temp 98.2°F | Resp 20 | Ht 64.5 in | Wt 169.4 lb

## 2017-01-04 DIAGNOSIS — C186 Malignant neoplasm of descending colon: Secondary | ICD-10-CM

## 2017-01-04 DIAGNOSIS — E118 Type 2 diabetes mellitus with unspecified complications: Secondary | ICD-10-CM | POA: Diagnosis not present

## 2017-01-04 LAB — COMPREHENSIVE METABOLIC PANEL
ALBUMIN: 3.8 g/dL (ref 3.5–5.0)
ALT: 23 U/L (ref 0–55)
AST: 19 U/L (ref 5–34)
Alkaline Phosphatase: 87 U/L (ref 40–150)
Anion Gap: 14 mEq/L — ABNORMAL HIGH (ref 3–11)
BUN: 8.7 mg/dL (ref 7.0–26.0)
CO2: 19 mEq/L — ABNORMAL LOW (ref 22–29)
Calcium: 9.3 mg/dL (ref 8.4–10.4)
Chloride: 106 mEq/L (ref 98–109)
Creatinine: 0.8 mg/dL (ref 0.6–1.1)
GLUCOSE: 175 mg/dL — AB (ref 70–140)
Potassium: 3.6 mEq/L (ref 3.5–5.1)
SODIUM: 139 meq/L (ref 136–145)
TOTAL PROTEIN: 7.6 g/dL (ref 6.4–8.3)
Total Bilirubin: 0.74 mg/dL (ref 0.20–1.20)

## 2017-01-04 LAB — CBC WITH DIFFERENTIAL/PLATELET
BASO%: 0.1 % (ref 0.0–2.0)
BASOS ABS: 0 10*3/uL (ref 0.0–0.1)
EOS ABS: 0.2 10*3/uL (ref 0.0–0.5)
EOS%: 2.1 % (ref 0.0–7.0)
HCT: 39.2 % (ref 34.8–46.6)
HGB: 13.4 g/dL (ref 11.6–15.9)
LYMPH%: 28.3 % (ref 14.0–49.7)
MCH: 29.5 pg (ref 25.1–34.0)
MCHC: 34.2 g/dL (ref 31.5–36.0)
MCV: 86.3 fL (ref 79.5–101.0)
MONO#: 0.5 10*3/uL (ref 0.1–0.9)
MONO%: 5.4 % (ref 0.0–14.0)
NEUT%: 64.1 % (ref 38.4–76.8)
NEUTROS ABS: 5.7 10*3/uL (ref 1.5–6.5)
Platelets: 224 10*3/uL (ref 145–400)
RBC: 4.54 10*6/uL (ref 3.70–5.45)
RDW: 15 % — AB (ref 11.2–14.5)
WBC: 8.9 10*3/uL (ref 3.9–10.3)
lymph#: 2.5 10*3/uL (ref 0.9–3.3)

## 2017-01-04 LAB — CEA (IN HOUSE-CHCC): CEA (CHCC-IN HOUSE): 1.25 ng/mL (ref 0.00–5.00)

## 2017-01-04 NOTE — Telephone Encounter (Signed)
Scheduled appt per 8/9 los - Gave patient AVS and calender per los. [facility] to contact patient with ct once order is in.

## 2017-02-20 DIAGNOSIS — Z23 Encounter for immunization: Secondary | ICD-10-CM | POA: Diagnosis not present

## 2017-02-20 DIAGNOSIS — E1165 Type 2 diabetes mellitus with hyperglycemia: Secondary | ICD-10-CM | POA: Diagnosis not present

## 2017-02-22 ENCOUNTER — Ambulatory Visit (AMBULATORY_SURGERY_CENTER): Payer: Self-pay

## 2017-02-22 VITALS — Ht 64.0 in | Wt 176.6 lb

## 2017-02-22 DIAGNOSIS — C186 Malignant neoplasm of descending colon: Secondary | ICD-10-CM

## 2017-02-22 MED ORDER — SUPREP BOWEL PREP KIT 17.5-3.13-1.6 GM/177ML PO SOLN: 1.0000 | Freq: Once | ORAL | 0 refills | Status: AC

## 2017-02-22 NOTE — Progress Notes (Signed)
No allergies to eggs or soy No diet meds No home oxygen No past problems with anesthesia EXCEPT PONV WITH GENERAL  Declined emmi

## 2017-02-23 ENCOUNTER — Encounter: Payer: Self-pay | Admitting: Gastroenterology

## 2017-03-08 ENCOUNTER — Encounter: Payer: Self-pay | Admitting: Gastroenterology

## 2017-03-08 ENCOUNTER — Ambulatory Visit (AMBULATORY_SURGERY_CENTER): Payer: BLUE CROSS/BLUE SHIELD | Admitting: Gastroenterology

## 2017-03-08 VITALS — BP 96/58 | HR 74 | Temp 98.0°F | Resp 18 | Ht 64.0 in | Wt 176.0 lb

## 2017-03-08 DIAGNOSIS — Z86010 Personal history of colonic polyps: Secondary | ICD-10-CM | POA: Diagnosis not present

## 2017-03-08 DIAGNOSIS — Z85038 Personal history of other malignant neoplasm of large intestine: Secondary | ICD-10-CM | POA: Diagnosis present

## 2017-03-08 DIAGNOSIS — D123 Benign neoplasm of transverse colon: Secondary | ICD-10-CM

## 2017-03-08 DIAGNOSIS — D120 Benign neoplasm of cecum: Secondary | ICD-10-CM

## 2017-03-08 MED ORDER — SODIUM CHLORIDE 0.9 % IV SOLN: 500.0000 mL | INTRAVENOUS | Status: DC

## 2017-03-08 NOTE — Progress Notes (Signed)
Report given to PACU, vss 

## 2017-03-08 NOTE — Progress Notes (Signed)
Called to room to assist during endoscopic procedure.  Patient ID and intended procedure confirmed with present staff. Received instructions for my participation in the procedure from the performing physician.  

## 2017-03-08 NOTE — Patient Instructions (Signed)
YOU HAD AN ENDOSCOPIC PROCEDURE TODAY AT [facility]:   Refer to the procedure report that was given to you for any specific questions about what was found during the examination.  If the procedure report does not answer your questions, please call your gastroenterologist to clarify.  If you requested that your care partner not be given the details of your procedure findings, then the procedure report has been included in a sealed envelope for you to review at your convenience later.  YOU SHOULD EXPECT: Some feelings of bloating in the abdomen. Passage of more gas than usual.  Walking can help get rid of the air that was put into your GI tract during the procedure and reduce the bloating. If you had a lower endoscopy (such as a colonoscopy or flexible sigmoidoscopy) you may notice spotting of blood in your stool or on the toilet paper. If you underwent a bowel prep for your procedure, you may not have a normal bowel movement for a few days.  Please Note:  You might notice some irritation and congestion in your nose or some drainage.  This is from the oxygen used during your procedure.  There is no need for concern and it should clear up in a day or so.  SYMPTOMS TO REPORT IMMEDIATELY:   Following lower endoscopy (colonoscopy or flexible sigmoidoscopy):  Excessive amounts of blood in the stool  Significant tenderness or worsening of abdominal pains  Swelling of the abdomen that is new, acute  Fever of 100F or higher  For urgent or emergent issues, a gastroenterologist can be reached at any hour by calling [phone].   DIET:  We do recommend a small meal at first, but then you may proceed to your regular diet.  Drink plenty of fluids but you should avoid alcoholic beverages for 24 hours.  ACTIVITY:  You should plan to take it easy for the rest of today and you should NOT DRIVE or use heavy machinery until tomorrow (because of the sedation medicines used during the test).     FOLLOW UP: Our staff will call the number listed on your records the next business day following your procedure to check on you and address any questions or concerns that you may have regarding the information given to you following your procedure. If we do not reach you, we will leave a message.  However, if you are feeling well and you are not experiencing any problems, there is no need to return our call.  We will assume that you have returned to your regular daily activities without incident.  If any biopsies were taken you will be contacted by phone or by letter within the next 1-3 weeks.  Please call us at [phone] if you have not heard about the biopsies in 3 weeks.    SIGNATURES/CONFIDENTIALITY: You and/or your care partner have signed paperwork which will be entered into your electronic medical record.  These signatures attest to the fact that that the information above on your After Visit Summary has been reviewed and is understood.  Full responsibility of the confidentiality of this discharge information lies with you and/or your care-partner.  Read all of the handouts given to you by your recovery room nurse.  Recall colon in 2 years per Dr. Fuller Plan.

## 2017-03-08 NOTE — Progress Notes (Signed)
Pt's states no medical or surgical changes since previsit or office visit. 

## 2017-03-08 NOTE — Op Note (Signed)
[name] Procedure Date: 03/08/2017 11:00 AM MRN: [id number] Endoscopist: Ladene Artist , MD Age: 46 Referring MD:  Date of Birth: [birth year] Gender: Female Account #: [id number] Procedure:                Colonoscopy Indications:              High risk colon cancer surveillance: Personal                            history of colon cancer Medicines:                Monitored Anesthesia Care Procedure:                Pre-Anesthesia Assessment:                           - Prior to the procedure, a History and Physical                            was performed, and patient medications and                            allergies were reviewed. The patient's tolerance of                            previous anesthesia was also reviewed. The risks                            and benefits of the procedure and the sedation                            options and risks were discussed with the patient.                            All questions were answered, and informed consent                            was obtained. Prior Anticoagulants: The patient has                            taken no previous anticoagulant or antiplatelet                            agents. ASA Grade Assessment: II - A patient with                            mild systemic disease. After reviewing the risks                            and benefits, the patient was deemed in                            satisfactory condition to undergo the procedure.  After obtaining informed consent, the colonoscope                            was passed under direct vision. Throughout the                            procedure, the patient's blood pressure, pulse, and                            oxygen saturations were monitored continuously. The                            Colonoscope was introduced through the anus and                            advanced to the the cecum,  identified by                            appendiceal orifice and ileocecal valve. The                            ileocecal valve, appendiceal orifice, and rectum                            were photographed. The quality of the bowel                            preparation was good. The colonoscopy was performed                            without difficulty. The patient tolerated the                            procedure well. Scope In: 11:08:04 AM Scope Out: 11:17:15 AM Scope Withdrawal Time: 0 hours 8 minutes 17 seconds  Total Procedure Duration: 0 hours 9 minutes 11 seconds  Findings:                 The perianal and digital rectal examinations were                            normal.                           Two sessile polyps were found in the transverse                            colon and cecum. The polyps were 5 to 7 mm in size.                            These polyps were removed with a cold snare.                            Resection and retrieval were complete.  There was evidence of a prior end-to-end                            colo-colonic anastomosis in the transverse colon.                            Prior left hemicolectomy. This was patent and was                            characterized by healthy appearing mucosa. The                            anastomosis was traversed.                           The exam was otherwise without abnormality on                            direct and retroflexion views. Complications:            No immediate complications. Estimated blood loss:                            None. Estimated Blood Loss:     Estimated blood loss: none. Impression:               - Two 5 to 7 mm polyps in the transverse colon and                            in the cecum, removed with a cold snare. Resected                            and retrieved.                           - Patent end-to-end colo-colonic anastomosis,                             characterized by healthy appearing mucosa.                           - Otherwise normal appearing colon Recommendation:           - Repeat colonoscopy in 2 years for surveillance.                           - Patient has a contact number available for                            emergencies. The signs and symptoms of potential                            delayed complications were discussed with the                            patient. Return to normal activities tomorrow.  Written discharge instructions were provided to the                            patient.                           - Resume previous diet.                           - Continue present medications.                           - Await pathology results. Ladene Artist, MD 03/08/2017 11:24:03 AM This report has been signed electronically.

## 2017-03-09 ENCOUNTER — Telehealth: Payer: Self-pay | Admitting: *Deleted

## 2017-03-09 NOTE — Telephone Encounter (Signed)
  Follow up Call-  Call back number 03/08/2017 04/17/2016  Post procedure Call Back phone  # [phone]  Permission to leave phone message Yes Yes  Some recent data might be hidden    [facility]

## 2017-03-12 ENCOUNTER — Telehealth: Payer: Self-pay | Admitting: *Deleted

## 2017-03-12 NOTE — Telephone Encounter (Signed)
No answer. Number identifier. Message left to call if questions or concerns. 

## 2017-03-21 ENCOUNTER — Encounter: Payer: Self-pay | Admitting: Gastroenterology

## 2017-05-07 ENCOUNTER — Ambulatory Visit (HOSPITAL_COMMUNITY): Admission: RE | Admit: 2017-05-07 | Payer: BLUE CROSS/BLUE SHIELD | Source: Ambulatory Visit

## 2017-05-07 ENCOUNTER — Other Ambulatory Visit: Payer: BLUE CROSS/BLUE SHIELD

## 2017-05-07 ENCOUNTER — Encounter (HOSPITAL_COMMUNITY): Payer: Self-pay

## 2017-05-10 ENCOUNTER — Ambulatory Visit: Payer: BLUE CROSS/BLUE SHIELD | Admitting: Hematology

## 2017-05-10 ENCOUNTER — Ambulatory Visit (HOSPITAL_COMMUNITY)
Admission: RE | Admit: 2017-05-10 | Discharge: 2017-05-10 | Disposition: A | Discharge status: Home / Self Care | Payer: BLUE CROSS/BLUE SHIELD | Source: Ambulatory Visit | Attending: Hematology | Admitting: Hematology

## 2017-05-10 ENCOUNTER — Other Ambulatory Visit (HOSPITAL_BASED_OUTPATIENT_CLINIC_OR_DEPARTMENT_OTHER): Payer: BLUE CROSS/BLUE SHIELD

## 2017-05-10 DIAGNOSIS — R911 Solitary pulmonary nodule: Secondary | ICD-10-CM | POA: Diagnosis not present

## 2017-05-10 DIAGNOSIS — D252 Subserosal leiomyoma of uterus: Secondary | ICD-10-CM | POA: Insufficient documentation

## 2017-05-10 DIAGNOSIS — C186 Malignant neoplasm of descending colon: Secondary | ICD-10-CM

## 2017-05-10 DIAGNOSIS — M5126 Other intervertebral disc displacement, lumbar region: Secondary | ICD-10-CM | POA: Insufficient documentation

## 2017-05-10 DIAGNOSIS — K429 Umbilical hernia without obstruction or gangrene: Secondary | ICD-10-CM | POA: Insufficient documentation

## 2017-05-10 DIAGNOSIS — K760 Fatty (change of) liver, not elsewhere classified: Secondary | ICD-10-CM | POA: Diagnosis not present

## 2017-05-10 DIAGNOSIS — N281 Cyst of kidney, acquired: Secondary | ICD-10-CM | POA: Insufficient documentation

## 2017-05-10 DIAGNOSIS — C189 Malignant neoplasm of colon, unspecified: Secondary | ICD-10-CM | POA: Diagnosis not present

## 2017-05-10 DIAGNOSIS — R918 Other nonspecific abnormal finding of lung field: Secondary | ICD-10-CM | POA: Diagnosis not present

## 2017-05-10 DIAGNOSIS — I700 Atherosclerosis of aorta: Secondary | ICD-10-CM | POA: Insufficient documentation

## 2017-05-10 LAB — COMPREHENSIVE METABOLIC PANEL
ALBUMIN: 3.7 g/dL (ref 3.5–5.0)
ALK PHOS: 91 U/L (ref 40–150)
ALT: 23 U/L (ref 0–55)
ANION GAP: 14 meq/L — AB (ref 3–11)
AST: 22 U/L (ref 5–34)
BILIRUBIN TOTAL: 0.58 mg/dL (ref 0.20–1.20)
BUN: 10.7 mg/dL (ref 7.0–26.0)
CALCIUM: 9.3 mg/dL (ref 8.4–10.4)
CO2: 22 mEq/L (ref 22–29)
CREATININE: 0.8 mg/dL (ref 0.6–1.1)
Chloride: 102 mEq/L (ref 98–109)
EGFR: >60 mL/min/{1.73_m2} (ref >60)
Glucose: 253 mg/dl — ABNORMAL HIGH (ref 70–140)
Potassium: 3.5 mEq/L (ref 3.5–5.1)
Sodium: 137 mEq/L (ref 136–145)
TOTAL PROTEIN: 7.7 g/dL (ref 6.4–8.3)

## 2017-05-10 LAB — CBC WITH DIFFERENTIAL/PLATELET
BASO%: 0.9 % (ref 0.0–2.0)
BASOS ABS: 0.1 10*3/uL (ref 0.0–0.1)
EOS%: 2.1 % (ref 0.0–7.0)
Eosinophils Absolute: 0.2 10*3/uL (ref 0.0–0.5)
HCT: 39.9 % (ref 34.8–46.6)
HEMOGLOBIN: 13.3 g/dL (ref 11.6–15.9)
LYMPH%: 30.6 % (ref 14.0–49.7)
MCH: 29.5 pg (ref 25.1–34.0)
MCHC: 33.3 g/dL (ref 31.5–36.0)
MCV: 88.6 fL (ref 79.5–101.0)
MONO#: 0.6 10*3/uL (ref 0.1–0.9)
MONO%: 7 % (ref 0.0–14.0)
NEUT%: 59.4 % (ref 38.4–76.8)
NEUTROS ABS: 5.1 10*3/uL (ref 1.5–6.5)
Platelets: 265 10*3/uL (ref 145–400)
RBC: 4.51 10*6/uL (ref 3.70–5.45)
RDW: 14.4 % (ref 11.2–14.5)
WBC: 8.7 10*3/uL (ref 3.9–10.3)
lymph#: 2.7 10*3/uL (ref 0.9–3.3)

## 2017-05-10 LAB — CEA (IN HOUSE-CHCC): CEA (CHCC-IN HOUSE): 1.72 ng/mL (ref 0.00–5.00)

## 2017-05-10 MED ORDER — IOPAMIDOL (ISOVUE-300) INJECTION 61%
100.0000 mL | Freq: Once | INTRAVENOUS | Status: AC | PRN
  Administered 2017-05-10: 100 mL via INTRAVENOUS

## 2017-05-10 MED ORDER — IOPAMIDOL (ISOVUE-300) INJECTION 61%
INTRAVENOUS | Status: AC
  Filled 2017-05-10: qty 100

## 2017-05-13 NOTE — Progress Notes (Addendum)
[facility]  [phone]) [phone] Fax:([phone]) [phone]  Clinic Follow up Note   Patient Care Team: Donald Prose, MD as PCP - General (Family Medicine) Truitt Merle, MD as Consulting Physician (Hematology) Kinsinger, Arta Bruce, MD as Consulting Physician (General Surgery) 05/14/2017  CHIEF COMPLAINT: F/u stage IIIB colon cancer  SUMMARY OF ONCOLOGIC HISTORY: Oncology History   Cancer of left colon [facility])   Staging form: Colon and Rectum, AJCC 7th Edition   - Pathologic stage from 01/18/2016: Stage IIIB (T3, N1a, cM0) - Signed by Truitt Merle, MD on 02/01/2016      Cancer of left colon s/p colostomy takedown 05/11/2016   01/17/2016 Initial Diagnosis    Cancer of left colon ([place])      01/17/2016 Procedure    Flexible sigmoidoscopy showed malignant-appearing obstructing tumor in the descending colon, biopsied.      01/18/2016 Surgery    Patient underwent left hemicolectomy       01/18/2016 Pathology Results    Left hemicolectomy showed invasive adenocarcinoma, well differentiated, 4.1 cm, tumor extends into pericolonic soft tissue, lymphovascular invasion is identified, margins were negative, 1 out of 27 lymph nodes were positive.      02/24/2016 - 04/28/2016 Chemotherapy    Adjuvant CAPOX (Xeloda on day 1-14, oxaliplatin on day 1) every 3 weeks, for total of 4 cycles       08/07/2016 Imaging    CT A/P w contrast  IMPRESSION: 1. No complications identified status post partial colectomy. 2. There is a soft tissue attenuating nodule within the peritoneal cavity which is indeterminate. Although this may represent postsurgical change peritoneal metastasis cannot be excluded and close interval follow-up is advised.      10/02/2016 Imaging    CT Abdomen/Pelvis with contrast 10/02/16 IMPRESSION: 1. Partial regression of previously suspected soft tissue nodule in the anterior aspect of the peritoneal cavity. Given the spontaneous regression since March 2018, in the  absence of chemotherapy (last administered in December 2017), this is presumably a benign area of resolving postoperative scarring. No other suspicious findings are noted in the abdomen or pelvis on today's examination to suggest metastatic disease. 2. Borderline splenomegaly. 3. Probable mild hepatic steatosis. 4. Aortic atherosclerosis (mild). 5. Additional incidental findings, similar to prior examinations, as above.       03/08/2017 Procedure    COLONOSCOPY - Two 5 to 7 mm polyps in the transverse colon and in the cecum, removed with a cold snare. Resected and retrieved. - Patent end-to-end colo-colonic anastomosis, characterized by healthy appearing mucosa. - Otherwise normal appearing colon Impression: - Repeat colonoscopy in 2 years for surveillance.      03/08/2017 Pathology Results    Diagnosis (from colonoscopy)  Surgical [P], transverse and cecum, polyp (2) - TUBULAR ADENOMA (TWO FRAGMENTS). - NO HIGH GRADE DYSPLASIA OR MALIGNANCY.      05/11/2017 Imaging    CT CAP w contrast IMPRESSION: 1. No findings of metastatic disease. Further resolution of the previous omental nodularity, which was likely previously postinflammatory. 2. Borderline prominent peripancreatic lymph node is chronically stable and likely benign/incidental. 3. Other imaging findings of potential clinical significance: Several tiny stable ground-glass density nodules in the upper lobes, highly likely to be benign. Diffuse hepatic steatosis. Left mid kidney cyst. Aortic Atherosclerosis (ICD10-I70.0). Subserosal fibroid along the uterine fundus. Central disc protrusions in the lumbar spine. Small supraumbilical hernia.     CURRENT THERAPY: surveillance   INTERVAL HISTORY: Taylor Whitney returns for colon cancer surveillance f/u as scheduled. She had a colonoscopy  in 02/2017 and CT CAP 04/2017. She feels well, denies changes in her health overall. Working full time, no fatigue or decreased  appetite. Denies change in bowel pattern, has 1 formed BM daily, no blood in stool or n/v/c/d. No abdominal pain.      REVIEW OF SYSTEMS:   Constitutional: Denies fatigue, fevers, chills or abnormal weight loss (+) good appetite   Eyes: Denies blurriness of vision Ears, nose, mouth, throat, and face: Denies mucositis or sore throat Respiratory: Denies cough, dyspnea or wheezes Cardiovascular: Denies palpitation, chest discomfort or lower extremity swelling Gastrointestinal:  Denies nausea, vomiting, constipation, diarrhea, heartburn, abdominal pain, blood in stool, or change in bowel habits (+) 1 formed BM daily  GU: denies vaginal bleeding or irregular menses (+) periodic mild pelvic pain Skin: Denies abnormal skin rashes (+) skin redness to arms and face  Lymphatics: Denies new lymphadenopathy or easy bruising Neurological:Denies numbness, tingling or new weaknesses Behavioral/Psych: Mood is stable, no new changes  All other systems were reviewed with the patient and are negative.  MEDICAL HISTORY:  Past Medical History:  Diagnosis Date  . Adenocarcinoma of descending colon ([place]) 12/2015   Flex sigmoidoscopy biopsy of descending colon mass is adenocarcinoma/notes 01/18/2016  . Anemia   . Cancer [facility])    Hodgkin's lymphoma- Dr. Burr Medico  . Depression   . Family history of kidney cancer   . History of colostomy reversal   . Hodgkin lymphoma ([place]) dx'd 01/1995  . Large bowel obstruction s/p colectomy/colostomy Aug 2017 01/16/2016  . Migraine    "maybe once/month" (01/18/2016)  . Type II diabetes mellitus ([place]) 06/2015    SURGICAL HISTORY: Past Surgical History:  Procedure Laterality Date  . COLON RESECTION N/A 01/18/2016   Procedure: OPEN PARTIAL COLECTOMY WITH END COLOSTOMY;  Surgeon: Mickeal Skinner, MD;  Location: [place];  Service: General;  Laterality: N/A;  . COLOSTOMY  01/18/2016  . COLOSTOMY TAKEDOWN N/A 05/11/2016   Procedure: LAPAROSCOPIC CONVERTED TO OPEN REVERSAL OF END  COLOSTOMY;  Surgeon: Arta Bruce Kinsinger, MD;  Location: [facility];  Service: General;  Laterality: N/A;  . DILATION AND CURETTAGE OF UTERUS    . FLEXIBLE SIGMOIDOSCOPY N/A 01/17/2016   Procedure: FLEXIBLE SIGMOIDOSCOPY;  Surgeon: Ladene Artist, MD;  Location: [facility] ENDOSCOPY;  Service: Endoscopy;  Laterality: N/A;  . LYMPH NODE BIOPSY  1996  . MANDIBLE SURGERY  1988  . PARTIAL COLECTOMY  01/18/2016  . PROCTOSCOPY  05/11/2016   Procedure: PROCTOSCOPY;  Surgeon: Mickeal Skinner, MD;  Location: [facility];  Service: General;;  . Long    I have reviewed the social history and family history with the patient and they are unchanged from previous note.  ALLERGIES:  is allergic to sulfa antibiotics.  MEDICATIONS:  Current Outpatient Medications  Medication Sig Dispense Refill  . ibuprofen (ADVIL,MOTRIN) 800 MG tablet Take 1 tablet (800 mg total) by mouth every 8 (eight) hours as needed. 30 tablet 0  . metFORMIN (GLUCOPHAGE-XR) 500 MG 24 hr tablet Take 500 mg by mouth 2 (two) times daily.  0  . venlafaxine XR (EFFEXOR-XR) 75 MG 24 hr capsule Take 225 mg by mouth at bedtime.  5   No current facility-administered medications for this visit.     PHYSICAL EXAMINATION: ECOG PERFORMANCE STATUS: 0 - Asymptomatic  Vitals:   05/14/17 1437 05/14/17 1439  BP: (!) 140/93 (!) 149/93  Pulse: (!) 107 (!) 101  Resp:    Temp:    SpO2:  Filed Weights   05/14/17 1347  Weight: 177 lb 9.6 oz (80.6 kg)    GENERAL:alert, no distress and comfortable SKIN: skin color, texture, turgor are normal, no rashes or significant lesions (+) diffuse skin erythema to face and arms bilaterally, no rash, wounds, bruises EYES: normal, Conjunctiva are pink and non-injected, sclera clear OROPHARYNX:no exudate, no erythema and lips, buccal mucosa, and tongue normal  NECK: supple, thyroid normal size, non-tender, without nodularity LYMPH:  no palpable cervical, supraclavicular, axillary, or  inguinal lymphadenopathy LUNGS: clear to auscultation bilaterally with normal breathing effort HEART: regular rate & rhythm and no murmurs and no lower extremity edema ABDOMEN:abdomen soft, non-tender and normal bowel sounds (+) midline and RUQ scars are well healed  Musculoskeletal:no cyanosis of digits and no clubbing  NEURO: alert & oriented x 3 with fluent speech, no focal motor/sensory deficits  LABORATORY DATA:  I have reviewed the data as listed CBC Latest Ref Rng & Units 05/10/2017 01/04/2017 10/02/2016  WBC 3.9 - 10.3 10e3/uL 8.7 8.9 8.6  Hemoglobin 11.6 - 15.9 g/dL 13.3 13.4 12.9  Hematocrit 34.8 - 46.6 % 39.9 39.2 38.3  Platelets 145 - 400 10e3/uL 265 224 263     CMP Latest Ref Rng & Units 05/10/2017 01/04/2017 10/02/2016  Glucose 70 - 140 mg/dl 253(H) 175(H) 202(H)  BUN 7.0 - 26.0 mg/dL 10.7 8.7 9.5  Creatinine 0.6 - 1.1 mg/dL 0.8 0.8 0.8  Sodium 136 - 145 mEq/L 137 139 139  Potassium 3.5 - 5.1 mEq/L 3.5 3.6 3.6  Chloride 101 - 111 mmol/L - - -  CO2 22 - 29 mEq/L 22 19(L) 18(L)  Calcium 8.4 - 10.4 mg/dL 9.3 9.3 9.2  Total Protein 6.4 - 8.3 g/dL 7.7 7.6 7.8  Total Bilirubin 0.20 - 1.20 mg/dL 0.58 0.74 0.39  Alkaline Phos 40 - 150 U/L 91 87 98  AST 5 - 34 U/L '22 19 15  ' ALT 0 - 55 U/L '23 23 18   ' PATHOLOGY REPORT  Diagnosis 01/18/2016 Colon, segmental resection for tumor, Left - INVASIVE ADENOCARCINOMA, WELL DIFFERENTIATED, SPANNING 4.1 CM. - ADENOCARCINOMA EXTENDS INTO PERICOLONIC SOFT TISSUE. - LYMPHOVASCULAR INVASION IS IDENTIFIED. - THE SURGICAL RESECTION MARGINS ARE NEGATIVE FOR CARCINOMA. - METASTATIC CARCINOMA IN 1 OF 27 LYMPH NODES (1/27). - DIVERTICULAR DISEASE (GROSS DIAGNOSIS). - SEE ONCOLOGY TABLE BELOW.   Diagnosis 01/17/2016 Colon, biopsy, descending - POSITIVE FOR ADENOCARCINOMA.  RADIOGRAPHIC STUDIES: I have personally reviewed the radiological images as listed and agreed with the findings in the report. No results found.  Sigmoidoscopy  01/17/2016 - Malignant appearing, obstructing tumor in the descending colon. Biopsied. - Otherwise normal flex sig however limited by poor prep.  CT Abdomen/Pelvis with contrast 08/07/16 IMPRESSION: 1. No complications identified status post partial colectomy. 2. There is a soft tissue attenuating nodule within the peritoneal cavity which is indeterminate. Although this may represent postsurgical change peritoneal metastasis cannot be excluded and close interval follow-up is advised.  CT Abdomen/Pelvis with contrast 10/02/16 IMPRESSION: 1. Partial regression of previously suspected soft tissue nodule in the anterior aspect of the peritoneal cavity. Given the spontaneous regression since March 2018, in the absence of chemotherapy (last administered in December 2017), this is presumably a benign area of resolving postoperative scarring. No other suspicious findings are noted in the abdomen or pelvis on today's examination to suggest metastatic disease. 2. Borderline splenomegaly. 3. Probable mild hepatic steatosis. 4. Aortic atherosclerosis (mild). 5. Additional incidental findings, similar to prior examinations, as Above.  CT CAP IMPRESSION: 05/10/17 1. No  findings of metastatic disease. Further resolution of the previous omental nodularity, which was likely previously postinflammatory. 2. Borderline prominent peripancreatic lymph node is chronically stable and likely benign/incidental. 3. Other imaging findings of potential clinical significance: Several tiny stable ground-glass density nodules in the upper lobes, highly likely to be benign. Diffuse hepatic steatosis. Left mid kidney cyst. Aortic Atherosclerosis (ICD10-I70.0). Subserosal fibroid along the uterine fundus. Central disc protrusions in the lumbar spine. Small supraumbilical hernia.  ASSESSMENT & PLAN:  46 y.o.  with past medical history of diabetes, remote history of Hodgkin lymphoma, presented with bowel  obstruction.  1. Cancer of left colon, invasive adenocarcinoma, well-differentiated, pT3N1aM0, stage IIIB, MSI-stable  2. Iron deficient anemia 3. Genetics  4. DM  Taylor. [place] appears stable today, s/p surgical resection and adjuvant CAPOX x3 months completed 04/28/2016. She is nearly 1.5 years from initial diagnosis. We reviewed 02/2017 colonoscopy; 2 sessile polyps were removed, pathology reveals tubular adenoma, negative for dysplasia or malignancy. Repeat colonoscopy in 2 years per GI. 04/2017 surveillance CT CAP revealed no evidence of recurrence or metastatic disease. Previous omental nodularity has further decreased, borderline prominent peripancreatic lymph node is chronically stable and likely benign. Incidental finding of ground-glass lung nodules are highly likely to be benign. She is clinically doing well, no concern for recurrence. CEA WNL; CBC WNL, she is not anemic; Cmet normal except elevated BG; she is on metformin BID, will f/u with her PCP in 05/2017. BP and HR elevated today, she denies pain or anxiety. Will f/u BP with PCP at next visit. I recommend she f/u with GYN as well, incidental finding of subserosal uterine fibroid was noted on CT; she denies vaginal bleeding or irregular menses but does have occasional pelvis pain. She will return for lab and f/u in 4 and 8 months, then change to 6 month f/u at that time. Will repeat surveillance CT 12/2017 at 2 years from diagnosis.   PLAN Continue colon cancer surveillance Lab and f/u with Dr. Burr Medico in 4 months  F/u with PCP, GYN  All questions were answered. The patient knows to call the clinic with any problems, questions or concerns. No barriers to learning was detected.    Alla Feeling, NP 05/14/17   Addendum  I have seen the patient, examined her. I agree with the assessment and and plan and have edited the notes.   Taylor Whitney is doing well clinically.  Her surveillance CT scan was reviewed with patient in details, no  evidence of recurrence or metastatic disease, the small peritoneal nodules on previous scan has resolved, likely benign.  Incidental findings such as small lung nodule and hepatic steatosis were discussed with patient, I encourage her to check her lipid level with her PCP, and continue healthy diet and exercise.  Will continue surveillance.  Plan to see her back in 4 months, with repeated surveillance CT scan in 12/2017.  Truitt Merle  05/14/2017

## 2017-05-14 ENCOUNTER — Encounter: Payer: Self-pay | Admitting: Nurse Practitioner

## 2017-05-14 ENCOUNTER — Telehealth: Payer: Self-pay | Admitting: Nurse Practitioner

## 2017-05-14 ENCOUNTER — Ambulatory Visit (HOSPITAL_BASED_OUTPATIENT_CLINIC_OR_DEPARTMENT_OTHER): Payer: BLUE CROSS/BLUE SHIELD | Admitting: Nurse Practitioner

## 2017-05-14 VITALS — BP 149/93 | HR 101 | Temp 98.5°F | Resp 18 | Ht 64.0 in | Wt 177.6 lb

## 2017-05-14 DIAGNOSIS — E118 Type 2 diabetes mellitus with unspecified complications: Secondary | ICD-10-CM

## 2017-05-14 DIAGNOSIS — C186 Malignant neoplasm of descending colon: Secondary | ICD-10-CM | POA: Diagnosis not present

## 2017-05-14 NOTE — Telephone Encounter (Signed)
Scheduled appt per 12/17 los - Gave patient AVS and calender per los.  

## 2017-06-19 DIAGNOSIS — F325 Major depressive disorder, single episode, in full remission: Secondary | ICD-10-CM | POA: Diagnosis not present

## 2017-06-19 DIAGNOSIS — E119 Type 2 diabetes mellitus without complications: Secondary | ICD-10-CM | POA: Diagnosis not present

## 2017-06-19 DIAGNOSIS — G43009 Migraine without aura, not intractable, without status migrainosus: Secondary | ICD-10-CM | POA: Diagnosis not present

## 2017-07-26 DIAGNOSIS — Z1231 Encounter for screening mammogram for malignant neoplasm of breast: Secondary | ICD-10-CM | POA: Diagnosis not present

## 2017-08-22 DIAGNOSIS — R928 Other abnormal and inconclusive findings on diagnostic imaging of breast: Secondary | ICD-10-CM | POA: Diagnosis not present

## 2017-08-22 DIAGNOSIS — N6311 Unspecified lump in the right breast, upper outer quadrant: Secondary | ICD-10-CM | POA: Diagnosis not present

## 2017-08-22 DIAGNOSIS — N6322 Unspecified lump in the left breast, upper inner quadrant: Secondary | ICD-10-CM | POA: Diagnosis not present

## 2017-08-22 DIAGNOSIS — N6324 Unspecified lump in the left breast, lower inner quadrant: Secondary | ICD-10-CM | POA: Diagnosis not present

## 2017-08-30 DIAGNOSIS — R928 Other abnormal and inconclusive findings on diagnostic imaging of breast: Secondary | ICD-10-CM | POA: Diagnosis not present

## 2017-08-30 DIAGNOSIS — D241 Benign neoplasm of right breast: Secondary | ICD-10-CM | POA: Diagnosis not present

## 2017-09-12 ENCOUNTER — Other Ambulatory Visit: Payer: BLUE CROSS/BLUE SHIELD

## 2017-09-12 ENCOUNTER — Ambulatory Visit: Payer: BLUE CROSS/BLUE SHIELD | Admitting: Hematology

## 2017-09-13 NOTE — Progress Notes (Signed)
[place]  Telephone:([phone]) [phone] Fax:(336) [phone]  Clinic Follow up Note   Patient Care Team: Donald Prose, MD as PCP - General (Family Medicine) Truitt Merle, MD as Consulting Physician (Hematology) Kinsinger, Arta Bruce, MD as Consulting Physician (General Surgery)   Date of Service:  09/18/2017   CHIEF COMPLAINTS:  Follow up stage IIIB colon cancer   Oncology History   Cancer of left colon [facility])   Staging form: Colon and Rectum, AJCC 7th Edition   - Pathologic stage from 01/18/2016: Stage IIIB (T3, N1a, cM0) - Signed by Truitt Merle, MD on 02/01/2016      Cancer of left colon s/p colostomy takedown 05/11/2016   01/17/2016 Initial Diagnosis    Cancer of left colon ([place])      01/17/2016 Procedure    Flexible sigmoidoscopy showed malignant-appearing obstructing tumor in the descending colon, biopsied.      01/18/2016 Surgery    Patient underwent left hemicolectomy       01/18/2016 Pathology Results    Left hemicolectomy showed invasive adenocarcinoma, well differentiated, 4.1 cm, tumor extends into pericolonic soft tissue, lymphovascular invasion is identified, margins were negative, 1 out of 27 lymph nodes were positive.      02/24/2016 - 04/28/2016 Chemotherapy    Adjuvant CAPOX (Xeloda on day 1-14, oxaliplatin on day 1) every 3 weeks, for total of 4 cycles       08/07/2016 Imaging    CT A/P w contrast  IMPRESSION: 1. No complications identified status post partial colectomy. 2. There is a soft tissue attenuating nodule within the peritoneal cavity which is indeterminate. Although this may represent postsurgical change peritoneal metastasis cannot be excluded and close interval follow-up is advised.      10/02/2016 Imaging    CT Abdomen/Pelvis with contrast 10/02/16 IMPRESSION: 1. Partial regression of previously suspected soft tissue nodule in the anterior aspect of the peritoneal cavity. Given the spontaneous regression since March 2018, in the  absence of chemotherapy (last administered in December 2017), this is presumably a benign area of resolving postoperative scarring. No other suspicious findings are noted in the abdomen or pelvis on today's examination to suggest metastatic disease. 2. Borderline splenomegaly. 3. Probable mild hepatic steatosis. 4. Aortic atherosclerosis (mild). 5. Additional incidental findings, similar to prior examinations, as above.       03/08/2017 Procedure    COLONOSCOPY - Two 5 to 7 mm polyps in the transverse colon and in the cecum, removed with a cold snare. Resected and retrieved. - Patent end-to-end colo-colonic anastomosis, characterized by healthy appearing mucosa. - Otherwise normal appearing colon Impression: - Repeat colonoscopy in 2 years for surveillance.      03/08/2017 Pathology Results    Diagnosis (from colonoscopy)  Surgical [P], transverse and cecum, polyp (2) - TUBULAR ADENOMA (TWO FRAGMENTS). - NO HIGH GRADE DYSPLASIA OR MALIGNANCY.      05/11/2017 Imaging    CT CAP w contrast IMPRESSION: 1. No findings of metastatic disease. Further resolution of the previous omental nodularity, which was likely previously postinflammatory. 2. Borderline prominent peripancreatic lymph node is chronically stable and likely benign/incidental. 3. Other imaging findings of potential clinical significance: Several tiny stable ground-glass density nodules in the upper lobes, highly likely to be benign. Diffuse hepatic steatosis. Left mid kidney cyst. Aortic Atherosclerosis (ICD10-I70.0). Subserosal fibroid along the uterine fundus. Central disc protrusions in the lumbar spine. Small supraumbilical hernia.       HISTORY OF PRESENTING ILLNESS (02/01/2016):  Taylor Whitney 47 y.o. female is  here because of her recently diagnosed stage IIIB colon cancer, she presents to my clinic by herself, and her two sisters were on the phone during her visit.   She presents to [facility] hospital on  01/16/2016 with nausea and vomiting for one day. She had a CT scan and flexible sigmoidoscopy which showed a near obstructing mass in the descending colon. She was seen by surgeon Dr. Kieth Brightly and underwent left colectomy and end colostomy. There were minor injury to the duodenum during her surgery, postop study showed no leakage from duodenum. She tolerated surgery and recovered well, was discharged home after 9 days of hospital stay.  She has been recovering well from surgery, has minimum pain at incisions, her appetite is still relatively low, her energy level has improved, she table to function and tolerate daily activities without much difficulty. She lost about 20lbs in the past few months.  She was diagnosed with DM 6 months ago, on metformin, does not check her blood glucose at home.  She was diagnosed with HL stage III in 1996, she was diagnosed and treated in [place], s/p chemo ABVD 9 months (bleomycin was held earlier due to pulmonary toxicities), no radiation. She followed with her oncologist for 5 years after treatment.   CURRENT THERAPY: Surveillance  INTERIM HISTORY:  Aleighna returns for follow-up of colon cancer. She was last seen by me in 09/2017. In interim she was seen by NP Laice at our clinic 4 months ago. She presents to the clinic today noting she has been doing well. She said her BP was fine at other physicians. She is able to monitor with BP meter at home. Her BP is 165/95. She is willing to cut back on salt intake. Pt notes she had a mammogram in 06/2017 at [facility] which showed 2 lesions that were biopsied and found to be benign. She notes her BG has been elevated when she checks in the morning and understand this has not been controlled.    MEDICAL HISTORY:  Past Medical History:  Diagnosis Date  . Adenocarcinoma of descending colon ([place]) 12/2015   Flex sigmoidoscopy biopsy of descending colon mass is adenocarcinoma/notes 01/18/2016  . Anemia   . Cancer [facility])     Hodgkin's lymphoma- Dr. Burr Medico  . Depression   . Family history of kidney cancer   . History of colostomy reversal   . Hodgkin lymphoma ([place]) dx'd 01/1995  . Large bowel obstruction s/p colectomy/colostomy Aug 2017 01/16/2016  . Migraine    "maybe once/month" (01/18/2016)  . Type II diabetes mellitus ([place]) 06/2015    SURGICAL HISTORY: Past Surgical History:  Procedure Laterality Date  . COLON RESECTION N/A 01/18/2016   Procedure: OPEN PARTIAL COLECTOMY WITH END COLOSTOMY;  Surgeon: Mickeal Skinner, MD;  Location: [place];  Service: General;  Laterality: N/A;  . COLOSTOMY  01/18/2016  . COLOSTOMY TAKEDOWN N/A 05/11/2016   Procedure: LAPAROSCOPIC CONVERTED TO OPEN REVERSAL OF END COLOSTOMY;  Surgeon: Arta Bruce Kinsinger, MD;  Location: [facility];  Service: General;  Laterality: N/A;  . DILATION AND CURETTAGE OF UTERUS    . FLEXIBLE SIGMOIDOSCOPY N/A 01/17/2016   Procedure: FLEXIBLE SIGMOIDOSCOPY;  Surgeon: Ladene Artist, MD;  Location: [facility] ENDOSCOPY;  Service: Endoscopy;  Laterality: N/A;  . LYMPH NODE BIOPSY  1996  . MANDIBLE SURGERY  1988  . PARTIAL COLECTOMY  01/18/2016  . PROCTOSCOPY  05/11/2016   Procedure: PROCTOSCOPY;  Surgeon: Mickeal Skinner, MD;  Location: [facility];  Service: General;;  .  WISDOM TOOTH EXTRACTION  1990    SOCIAL HISTORY: Social History   Socioeconomic History  . Marital status: Single    Spouse name: Not on file  . Number of children: 0  . Years of education: Not on file  . Highest education level: Not on file  Occupational History  . Occupation: Professor    Comment: [employer]  . Financial resource strain: Not on file  . Food insecurity:    Worry: Not on file    Inability: Not on file  . Transportation needs:    Medical: Not on file    Non-medical: Not on file  Tobacco Use  . Smoking status: Never Smoker  . Smokeless tobacco: Never Used  Substance and Sexual Activity  . Alcohol use: Yes    Comment: rare  social; once socially  . Drug use: No  . Sexual activity: Not Currently  Lifestyle  . Physical activity:    Days per week: Not on file    Minutes per session: Not on file  . Stress: Not on file  Relationships  . Social connections:    Talks on phone: Not on file    Gets together: Not on file    Attends religious service: Not on file    Active member of club or organization: Not on file    Attends meetings of clubs or organizations: Not on file    Relationship status: Not on file  . Intimate partner violence:    Fear of current or ex partner: Not on file    Emotionally abused: Not on file    Physically abused: Not on file    Forced sexual activity: Not on file  Other Topics Concern  . 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 Diabetes Mother   . Cancer Father        prostate cancer  . Cancer Paternal Uncle        renal cancer ; smoker  . Diabetes Maternal Grandmother   . Diabetes Maternal Grandfather   . Clotting disorder Paternal Grandmother   . COPD Paternal Grandfather   . Colon cancer Neg Hx     ALLERGIES:  is allergic to sulfa antibiotics.  MEDICATIONS:  Current Outpatient Medications  Medication Sig Dispense Refill  . ibuprofen (ADVIL,MOTRIN) 800 MG tablet Take 1 tablet (800 mg total) by mouth every 8 (eight) hours as needed. 30 tablet 0  . metFORMIN (GLUCOPHAGE-XR) 500 MG 24 hr tablet Take 500 mg by mouth 2 (two) times daily.  0  . venlafaxine XR (EFFEXOR-XR) 75 MG 24 hr capsule Take 225 mg by mouth at bedtime.  5   No current facility-administered medications for this visit.     REVIEW OF SYSTEMS:   Constitutional: Denies fevers, chills or abnormal night sweats   Eyes: Denies blurriness of vision, double vision or watery  eyes Ears, nose, mouth, throat, and face: Denies mucositis or sore throat Respiratory: Denies cough, dyspnea or wheezes Cardiovascular: Denies palpitation, chest discomfort or lower extremity swelling Gastrointestinal:  Denies nausea, heartburn or change in bowel habits Skin: Denies abnormal skin rashes Lymphatics: Denies new lymphadenopathy or easy bruising Neurological:Denies numbness, tingling or new weaknesses Behavioral/Psych: Mood  is stable, no new changes  All other systems were reviewed with the patient and are negative.  PHYSICAL EXAMINATION:  ECOG PERFORMANCE STATUS: 0  Vitals:   09/18/17 1126  BP: (!) 165/95  Pulse: 95  Resp: 18  Temp: 98.6 F (37 C)  SpO2: 100%   Filed Weights   09/18/17 1126  Weight: 174 lb (78.9 kg)    GENERAL:alert, no distress and comfortable SKIN: skin color, texture, turgor are normal, no rashes or significant lesions  EYES: normal, conjunctiva are pink and non-injected, sclera clear OROPHARYNX:no exudate, no erythema and lips, buccal mucosa, and tongue normal  NECK: supple, thyroid normal size, non-tender, without nodularity LYMPH:  no palpable lymphadenopathy in the cervical, axillary or inguinal LUNGS: clear to auscultation and percussion with normal breathing effort HEART: regular rate & rhythm and no murmurs and no lower extremity edema ABDOMEN:abdomen soft, non-tender and normal bowel sounds, Surgical scar has well-healed, no tenderness  Musculoskeletal:no cyanosis of digits and no clubbing  PSYCH: alert & oriented x 3 with fluent speech NEURO: no focal motor/sensory deficits  LABORATORY DATA:  I have reviewed the data as listed CBC Latest Ref Rng & Units 09/18/2017 05/10/2017 01/04/2017  WBC 3.9 - 10.3 K/uL 8.2 8.7 8.9  Hemoglobin 11.6 - 15.9 g/dL 13.9 13.3 13.4  Hematocrit 34.8 - 46.6 % 41.2 39.9 39.2  Platelets 145 - 400 K/uL 272 265 224   CMP Latest Ref Rng & Units 09/18/2017 05/10/2017 01/04/2017  Glucose 70 - 140 mg/dL 312(H)  253(H) 175(H)  BUN 7 - 26 mg/dL 8 10.7 8.7  Creatinine 0.60 - 1.10 mg/dL 0.95 0.8 0.8  Sodium 136 - 145 mmol/L 136 137 139  Potassium 3.5 - 5.1 mmol/L 3.7 3.5 3.6  Chloride 98 - 109 mmol/L 100 - -  CO2 22 - 29 mmol/L 22 22 19(L)  Calcium 8.4 - 10.4 mg/dL 10.0 9.3 9.3  Total Protein 6.4 - 8.3 g/dL 8.0 7.7 7.6  Total Bilirubin 0.2 - 1.2 mg/dL 0.7 0.58 0.74  Alkaline Phos 40 - 150 U/L 85 91 87  AST 5 - 34 U/L '22 22 19  ' ALT 0 - 55 U/L '27 23 23    ' Results for THERASA, LORENZI (MRN [id number]) as of 09/18/2017 12:00  Ref. Range 04/28/2016 08:30 08/07/2016 09:06 01/04/2017 08:36 05/10/2017 13:32  CEA ([facility]) Latest Ref Range: 0.00 - 5.00 ng/mL 3.11 1.59 1.25 1.72  PENDING for 09/18/17   PATHOLOGY REPORT  Diagnosis 01/18/2016 Colon, segmental resection for tumor, Left - INVASIVE ADENOCARCINOMA, WELL DIFFERENTIATED, SPANNING 4.1 CM. - ADENOCARCINOMA EXTENDS INTO PERICOLONIC SOFT TISSUE. - LYMPHOVASCULAR INVASION IS IDENTIFIED. - THE SURGICAL RESECTION MARGINS ARE NEGATIVE FOR CARCINOMA. - METASTATIC CARCINOMA IN 1 OF 27 LYMPH NODES (1/27). - DIVERTICULAR DISEASE (GROSS DIAGNOSIS). - SEE ONCOLOGY TABLE BELOW.   Diagnosis 01/17/2016 Colon, biopsy, descending - POSITIVE FOR ADENOCARCINOMA.  RADIOGRAPHIC STUDIES: I have personally reviewed the radiological images as listed and agreed with the findings in the report. No results found.  Sigmoidoscopy 01/17/2016 - Malignant appearing, obstructing tumor in the descending colon. Biopsied. - Otherwise normal flex sig however limited by poor prep.  CT Abdomen/Pelvis with contrast 08/07/16 IMPRESSION: 1. No complications identified status post partial colectomy. 2. There is a soft tissue attenuating nodule within the peritoneal cavity which is indeterminate. Although this may represent postsurgical change peritoneal metastasis cannot be excluded and close interval follow-up is advised.  CT Abdomen/Pelvis with contrast  10/02/16 IMPRESSION: 1. Partial regression of previously suspected soft tissue nodule in  the anterior aspect of the peritoneal cavity. Given the spontaneous regression since March 2018, in the absence of chemotherapy (last administered in December 2017), this is presumably a benign area of resolving postoperative scarring. No other suspicious findings are noted in the abdomen or pelvis on today's examination to suggest metastatic disease. 2. Borderline splenomegaly. 3. Probable mild hepatic steatosis. 4. Aortic atherosclerosis (mild). 5. Additional incidental findings, similar to prior examinations, as Above.    CT CAP IMPRESSION: 05/10/17 1. No findings of metastatic disease. Further resolution of the previous omental nodularity, which was likely previously postinflammatory. 2. Borderline prominent peripancreatic lymph node is chronically stable and likely benign/incidental. 3. Other imaging findings of potential clinical significance: Several tiny stable ground-glass density nodules in the upper lobes, highly likely to be benign. Diffuse hepatic steatosis. Left mid kidney cyst. Aortic Atherosclerosis (ICD10-I70.0). Subserosal fibroid along the uterine fundus. Central disc protrusions in the lumbar spine. Small supraumbilical hernia.    ASSESSMENT & PLAN:  47 y.o.  with past medical history of diabetes, remote history of Hodgkin lymphoma, presented with bowel obstruction.  1. Cancer of left colon, invasive adenocarcinoma, well-differentiated, pT3N1aM0, stage IIIB, MSI-stable  -I previously reviewed her surgical pathology findings with patient and her sisters in great details -I previously reviewed her staging CT scans which showed no evidence of metastasis. -We previously discussed risk of cancer recurrence after complete surgical resection. Given her positive lymph nodes, stage IIIB disease, LVI (+), she is at high risk for recurrence.  -she is s/p surgical resection and  adjuvant CAPOX x3 months completed 04/28/2016. - CT scan from 08/07/16 revealed a new 1.7 cm soft tissue mass in the peritoneum, possible scar tissue versus peritoneal metastasis.  -I reviewed her follow-up CT scan from 10/02/2016, which showed significantly decrease the size of the soft tissue mass in the peritoneum, likely benign. No other evidence of metastasis. -We reviewed 02/2017 colonoscopy; 2 sessile polyps were removed, pathology reveals tubular adenoma, negative for dysplasia or malignancy. Repeat colonoscopy in 2 years per GI.  -04/2017 surveillance CT CAP revealed no evidence of recurrence or metastatic disease. Previous omental nodularity has further decreased, borderline prominent peripancreatic lymph node is chronically stable and likely benign. Incidental finding of ground-glass lung nodules are highly likely to be benign.  -From a cancer standpoint she is clinically doing very well, exam was unremarkable, lab results reviewed with her, normal CBC and CMP except hyperglycemia. No clinical concern for cancer recurrence. -Continue cancer surveillance. Next surveillance CT in 12/2017.  -Follow up in 4 months with scan.   2. Iron deficient anemia -Her initial iron study showed iron deficiency, probably from her previous GI bleeding from her colon cancer. -Her anemia has resolved   3. 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  This deletion is likely pathogenic with respect to EPCAM function, but it IS NOT expected to cause Lynch syndrome.  Two pathogenic mutations in EPCAM can cause a rare autosomal recessive condition called congenital diarrhea with tufting  enteropathy (CTE).  The Colorectal Cancer Panel offered by [facility] includes sequencing and/or duplication/deletion testing of the following 19 genes: APC, ATM, AXIN2, BMPR1A, CDH1, CHEK2, EPCAM, MLH1, MSH2, MSH6, MUTYH, PMS2, POLD1, POLE,  PTEN, SCG5/GREM1, SMAD4, STK11, and TP53. The report date is March 27, 2016.  4. DM and overweight  -She was diagnosed with diabetes in earlier 2017. She is on metformin once daily  -I previously encouraged her to monitor her blood sugar at home -She'll continue follow-up with her PCP -Her sugar was 175 on 01/04/2017 and she returned to taking metformin twice a day.  -Her Sugar is elevated at 312 today (09/18/17), this is not well controlled. I advised her to see her PCP sooner than July.  -I recommend she make a log of her BG levels throughout the day.  -She has been working to lose weight. I recommend she continue to lose a few pounds a month  5. Hypertension  -She denies history of hypertension, and states her blood pressure has been normal at home.  Her BP has been increasing at our clinic visits, 165/95 today. I suggest she monitor her BP at home and if remains high she should see her PCP. I advised her to lower salt intake and drink plenty of water.    6. Cancer screening  -Pt notes she had a mammogram in 06/2017 at [facility] which showed 2 lesions in her right breast that were biopsied and found to be benign.  I encouraged her to continue annual mammogram.  PLAN -lab and f/u in 4 months with scan CT CAP with contrast a few days before    All questions were answered. The patient knows to call the clinic with any problems, questions or concerns. I spent 20 minutes counseling the patient face to face. The total time spent in the appointment was 25 minutes and more than 50% was on counseling.  This document serves as a record of services personally performed by Truitt Merle, MD. It was created on her behalf by Joslyn Devon, a trained medical scribe. The  creation of this record is based on the scribe's personal observations and the provider's statements to them.   I have reviewed the above documentation for accuracy and completeness, and I agree with the above.   Truitt Merle 09/18/2017

## 2017-09-17 ENCOUNTER — Other Ambulatory Visit: Payer: BLUE CROSS/BLUE SHIELD

## 2017-09-17 ENCOUNTER — Ambulatory Visit: Payer: BLUE CROSS/BLUE SHIELD | Admitting: Hematology

## 2017-09-18 ENCOUNTER — Encounter: Payer: Self-pay | Admitting: Hematology

## 2017-09-18 ENCOUNTER — Telehealth: Payer: Self-pay | Admitting: Hematology

## 2017-09-18 ENCOUNTER — Inpatient Hospital Stay: Payer: BLUE CROSS/BLUE SHIELD | Admitting: Hematology

## 2017-09-18 ENCOUNTER — Inpatient Hospital Stay: Payer: BLUE CROSS/BLUE SHIELD | Attending: Oncology

## 2017-09-18 VITALS — BP 165/95 | HR 95 | Temp 98.6°F | Resp 18 | Ht 64.0 in | Wt 174.0 lb

## 2017-09-18 DIAGNOSIS — E663 Overweight: Secondary | ICD-10-CM | POA: Insufficient documentation

## 2017-09-18 DIAGNOSIS — Z9221 Personal history of antineoplastic chemotherapy: Secondary | ICD-10-CM | POA: Insufficient documentation

## 2017-09-18 DIAGNOSIS — Z79899 Other long term (current) drug therapy: Secondary | ICD-10-CM | POA: Insufficient documentation

## 2017-09-18 DIAGNOSIS — Z7984 Long term (current) use of oral hypoglycemic drugs: Secondary | ICD-10-CM | POA: Diagnosis not present

## 2017-09-18 DIAGNOSIS — I10 Essential (primary) hypertension: Secondary | ICD-10-CM

## 2017-09-18 DIAGNOSIS — E1165 Type 2 diabetes mellitus with hyperglycemia: Secondary | ICD-10-CM

## 2017-09-18 DIAGNOSIS — C186 Malignant neoplasm of descending colon: Secondary | ICD-10-CM | POA: Insufficient documentation

## 2017-09-18 DIAGNOSIS — Z8571 Personal history of Hodgkin lymphoma: Secondary | ICD-10-CM | POA: Diagnosis not present

## 2017-09-18 LAB — COMPREHENSIVE METABOLIC PANEL
ALBUMIN: 4 g/dL (ref 3.5–5.0)
ALT: 27 U/L (ref 0–55)
ANION GAP: 14 — AB (ref 3–11)
AST: 22 U/L (ref 5–34)
Alkaline Phosphatase: 85 U/L (ref 40–150)
BILIRUBIN TOTAL: 0.7 mg/dL (ref 0.2–1.2)
BUN: 8 mg/dL (ref 7–26)
CO2: 22 mmol/L (ref 22–29)
Calcium: 10 mg/dL (ref 8.4–10.4)
Chloride: 100 mmol/L (ref 98–109)
Creatinine, Ser: 0.95 mg/dL (ref 0.60–1.10)
GFR calc Af Amer: >60 mL/min (ref >60)
GFR calc non Af Amer: >60 mL/min (ref >60)
GLUCOSE: 312 mg/dL — AB (ref 70–140)
Potassium: 3.7 mmol/L (ref 3.5–5.1)
SODIUM: 136 mmol/L (ref 136–145)
TOTAL PROTEIN: 8 g/dL (ref 6.4–8.3)

## 2017-09-18 LAB — CBC WITH DIFFERENTIAL/PLATELET
BASOS ABS: 0 10*3/uL (ref 0.0–0.1)
BASOS PCT: 1 %
EOS ABS: 0.3 10*3/uL (ref 0.0–0.5)
Eosinophils Relative: 3 %
HEMATOCRIT: 41.2 % (ref 34.8–46.6)
HEMOGLOBIN: 13.9 g/dL (ref 11.6–15.9)
Lymphocytes Relative: 29 %
Lymphs Abs: 2.4 10*3/uL (ref 0.9–3.3)
MCH: 30 pg (ref 25.1–34.0)
MCHC: 33.7 g/dL (ref 31.5–36.0)
MCV: 88.8 fL (ref 79.5–101.0)
Monocytes Absolute: 0.5 10*3/uL (ref 0.1–0.9)
Monocytes Relative: 6 %
NEUTROS ABS: 5.1 10*3/uL (ref 1.5–6.5)
NEUTROS PCT: 61 %
Platelets: 272 10*3/uL (ref 145–400)
RBC: 4.64 MIL/uL (ref 3.70–5.45)
RDW: 14.1 % (ref 11.2–14.5)
WBC: 8.2 10*3/uL (ref 3.9–10.3)

## 2017-09-18 LAB — CEA (IN HOUSE-CHCC): CEA (CHCC-In House): 1.87 ng/mL (ref 0.00–5.00)

## 2017-09-18 NOTE — Telephone Encounter (Signed)
Appts already on scheduled for 4 months - r/s per 4/23 los  For labs to be done a few days before - patient aware of appt date and time. No print out wanted / My chart active.

## 2017-09-19 ENCOUNTER — Telehealth: Payer: Self-pay

## 2017-09-19 NOTE — Telephone Encounter (Signed)
Left voice message per Dr. Burr Medico notifying her that her blood work was normal.

## 2017-09-19 NOTE — Telephone Encounter (Signed)
-----   Message from Truitt Merle, MD sent at 09/19/2017  7:01 AM EDT ----- Please let pt know her CEA was normal yesterday, thanks   Truitt Merle  09/19/2017

## 2017-12-09 DIAGNOSIS — H5213 Myopia, bilateral: Secondary | ICD-10-CM | POA: Diagnosis not present

## 2017-12-17 DIAGNOSIS — R109 Unspecified abdominal pain: Secondary | ICD-10-CM | POA: Diagnosis not present

## 2017-12-17 DIAGNOSIS — R030 Elevated blood-pressure reading, without diagnosis of hypertension: Secondary | ICD-10-CM | POA: Diagnosis not present

## 2017-12-17 DIAGNOSIS — D259 Leiomyoma of uterus, unspecified: Secondary | ICD-10-CM | POA: Diagnosis not present

## 2017-12-17 DIAGNOSIS — E1165 Type 2 diabetes mellitus with hyperglycemia: Secondary | ICD-10-CM | POA: Diagnosis not present

## 2017-12-30 IMAGING — CR DG ABDOMEN 1V
2 series · 2 of 2 positions shown · non-contrast
Comparison: 01/21/2016

CLINICAL DATA: Evaluate ileus

EXAM:
ABDOMEN - 1 VIEW

[abdomen kub (1 of 2)]
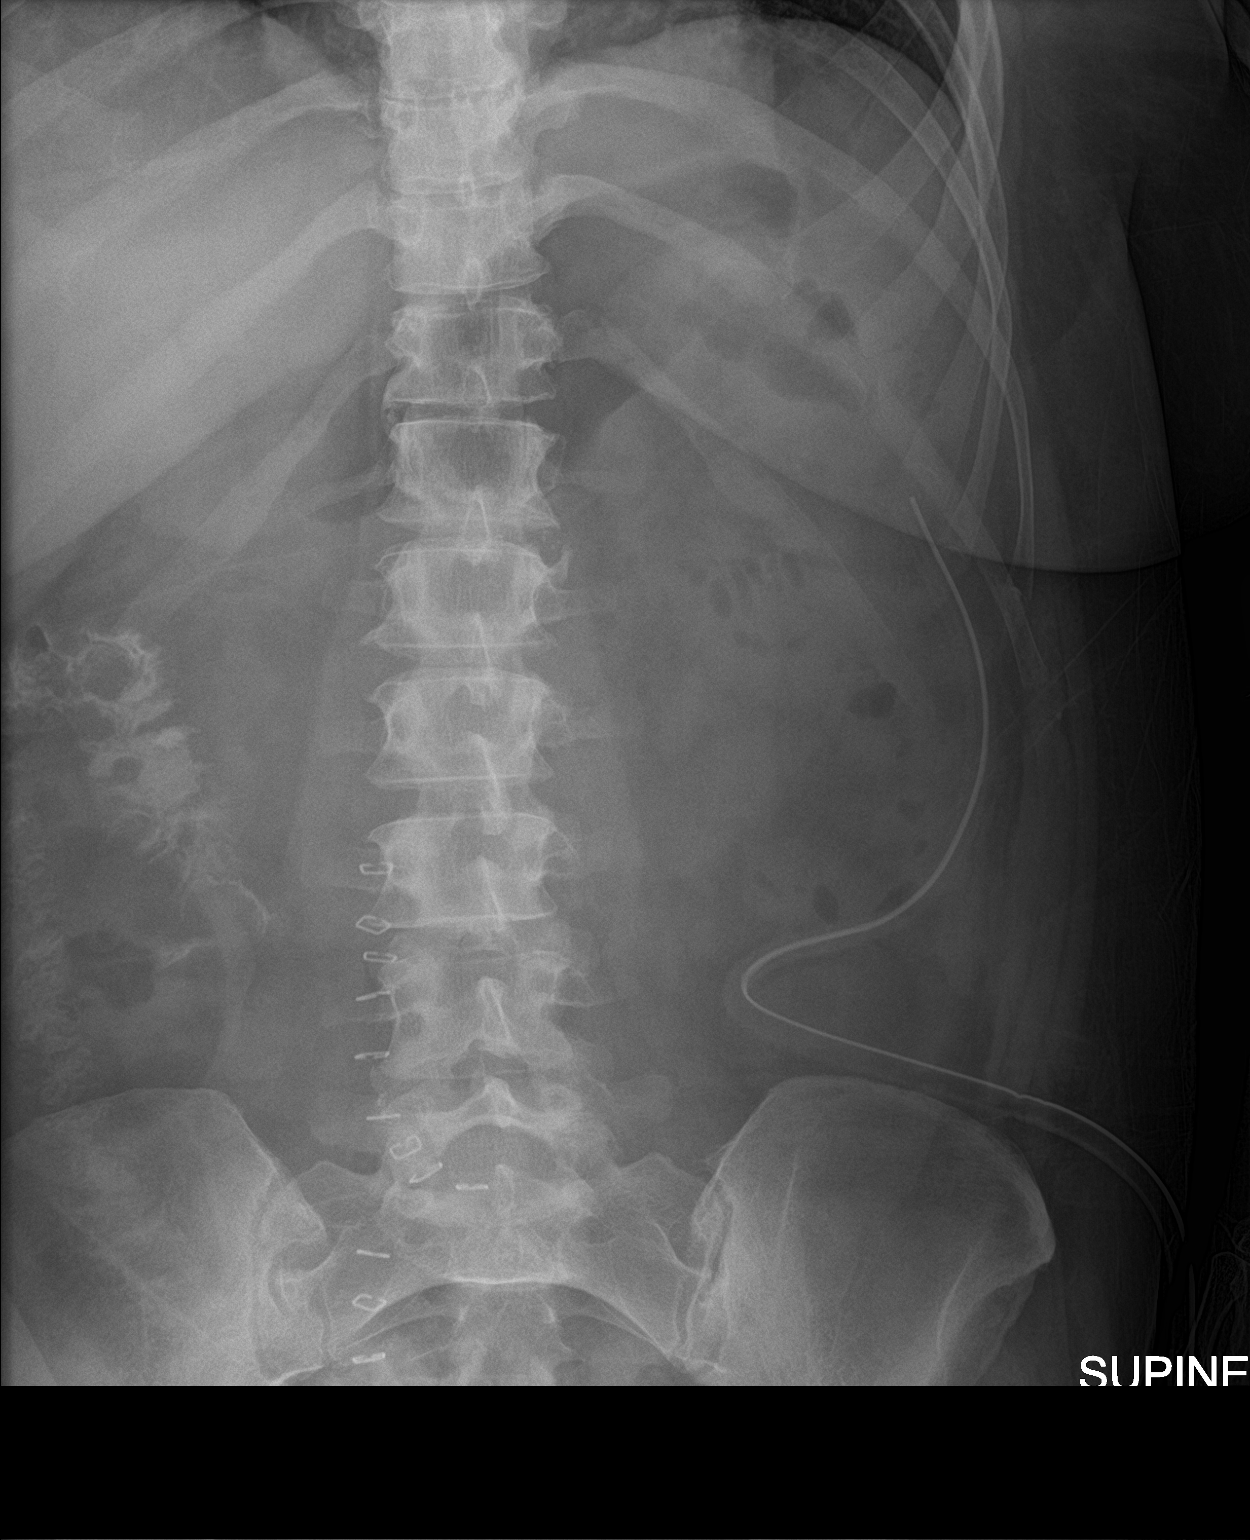

[abdomen kub (2 of 2)]
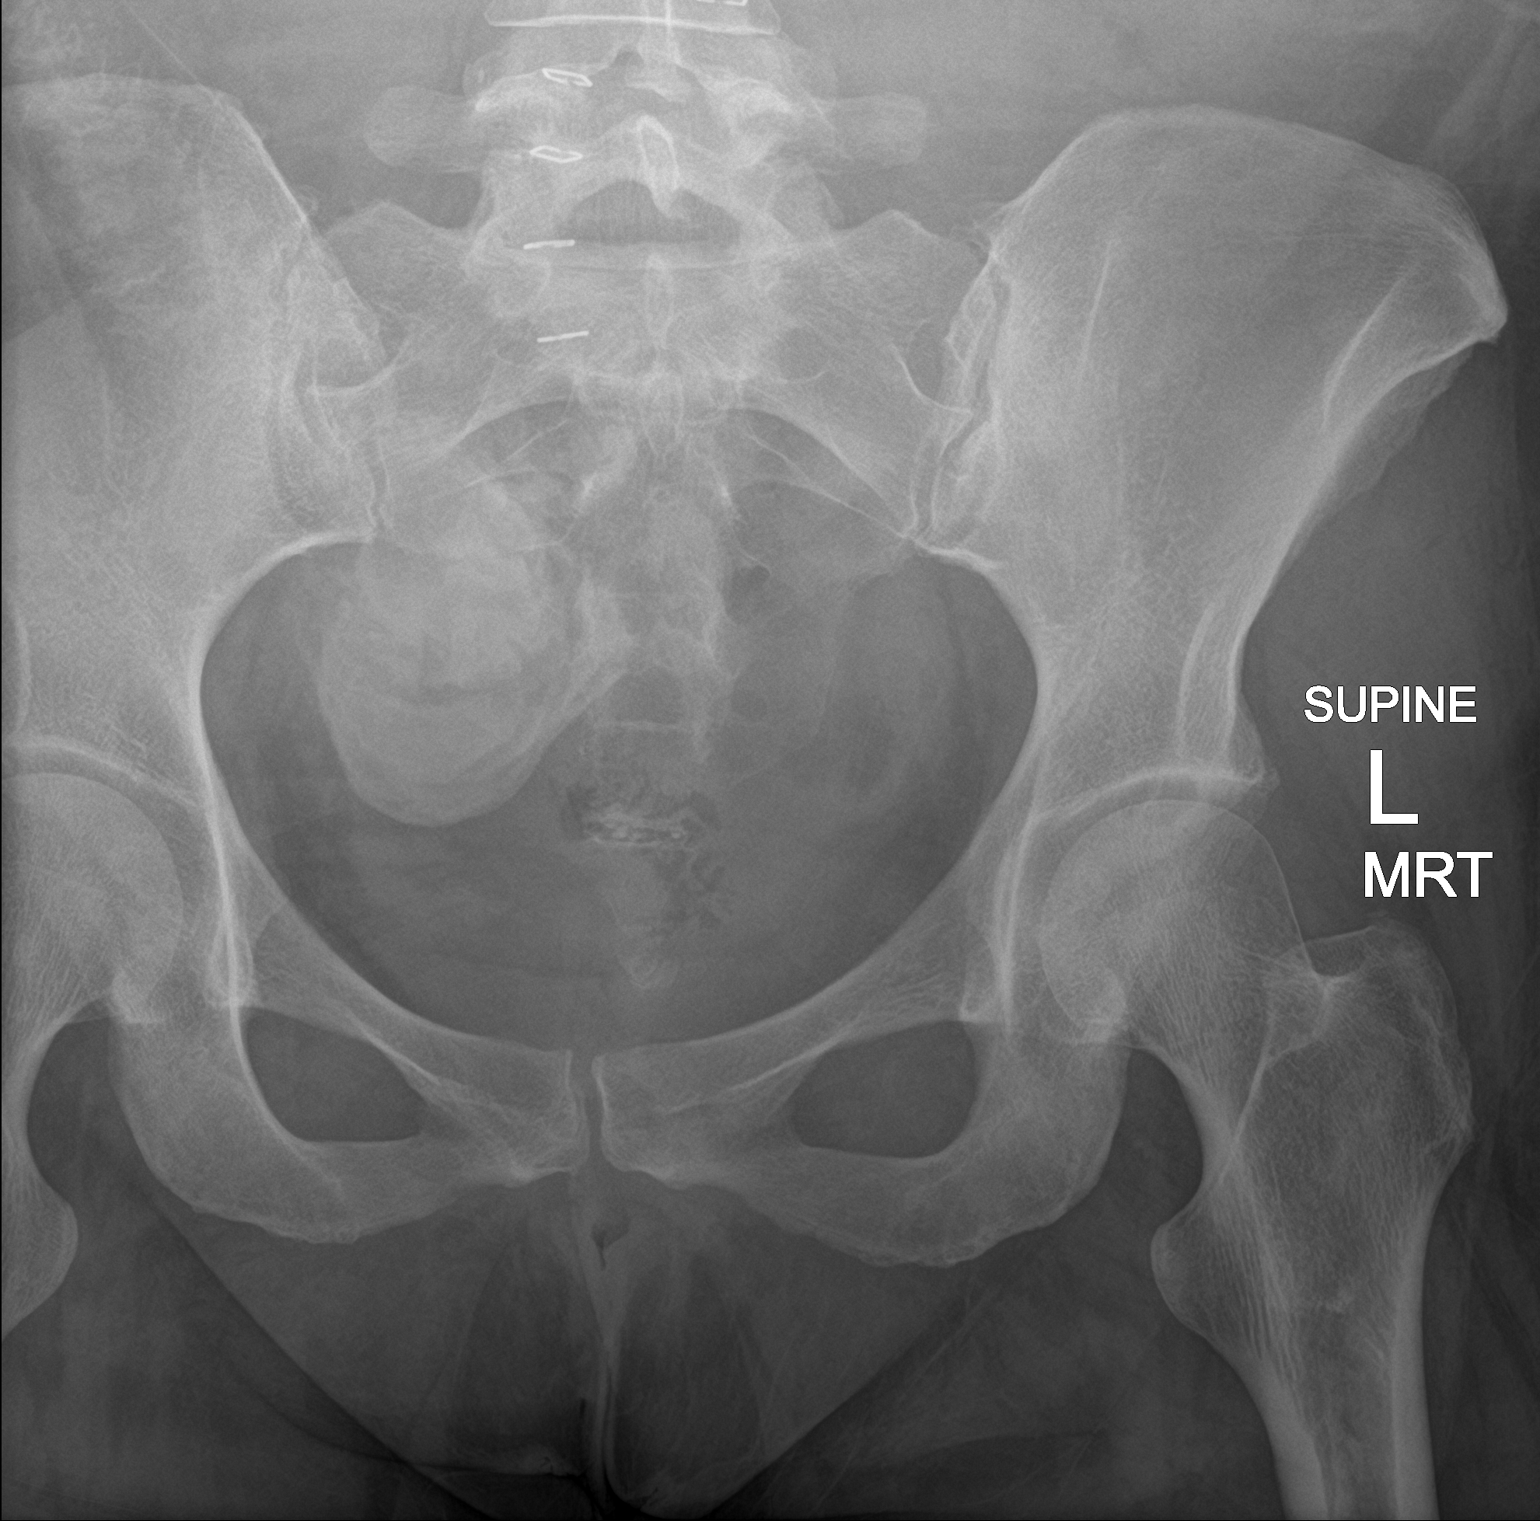

[2 of 2 positions shown; findings below may reference images not displayed]

FINDINGS: There is a surgical drain overlying the left hemi abdomen. The
nasogastric tube is been removed. No dilated loops of small or large
bowel identified. Enteric contrast material is identified within
right lower quadrant bowel loops.
IMPRESSION: 1. No abnormal bowel dilatation identified.

## 2018-01-09 ENCOUNTER — Ambulatory Visit (HOSPITAL_COMMUNITY)
Admission: RE | Admit: 2018-01-09 | Discharge: 2018-01-09 | Disposition: A | Discharge status: Home / Self Care | Payer: BLUE CROSS/BLUE SHIELD | Source: Ambulatory Visit | Attending: Hematology | Admitting: Hematology

## 2018-01-09 ENCOUNTER — Other Ambulatory Visit: Payer: Self-pay | Admitting: Hematology

## 2018-01-09 ENCOUNTER — Inpatient Hospital Stay: Payer: BLUE CROSS/BLUE SHIELD | Attending: Hematology

## 2018-01-09 DIAGNOSIS — Z9221 Personal history of antineoplastic chemotherapy: Secondary | ICD-10-CM | POA: Insufficient documentation

## 2018-01-09 DIAGNOSIS — Z8571 Personal history of Hodgkin lymphoma: Secondary | ICD-10-CM | POA: Diagnosis not present

## 2018-01-09 DIAGNOSIS — D259 Leiomyoma of uterus, unspecified: Secondary | ICD-10-CM | POA: Insufficient documentation

## 2018-01-09 DIAGNOSIS — Z8051 Family history of malignant neoplasm of kidney: Secondary | ICD-10-CM | POA: Insufficient documentation

## 2018-01-09 DIAGNOSIS — K760 Fatty (change of) liver, not elsewhere classified: Secondary | ICD-10-CM | POA: Insufficient documentation

## 2018-01-09 DIAGNOSIS — C186 Malignant neoplasm of descending colon: Secondary | ICD-10-CM | POA: Insufficient documentation

## 2018-01-09 DIAGNOSIS — E119 Type 2 diabetes mellitus without complications: Secondary | ICD-10-CM | POA: Diagnosis not present

## 2018-01-09 DIAGNOSIS — D509 Iron deficiency anemia, unspecified: Secondary | ICD-10-CM | POA: Diagnosis not present

## 2018-01-09 DIAGNOSIS — Z7984 Long term (current) use of oral hypoglycemic drugs: Secondary | ICD-10-CM | POA: Diagnosis not present

## 2018-01-09 DIAGNOSIS — E663 Overweight: Secondary | ICD-10-CM | POA: Insufficient documentation

## 2018-01-09 DIAGNOSIS — Z5111 Encounter for antineoplastic chemotherapy: Secondary | ICD-10-CM | POA: Diagnosis not present

## 2018-01-09 DIAGNOSIS — C189 Malignant neoplasm of colon, unspecified: Secondary | ICD-10-CM | POA: Diagnosis not present

## 2018-01-09 DIAGNOSIS — I10 Essential (primary) hypertension: Secondary | ICD-10-CM | POA: Diagnosis not present

## 2018-01-09 DIAGNOSIS — Z79899 Other long term (current) drug therapy: Secondary | ICD-10-CM | POA: Diagnosis not present

## 2018-01-09 LAB — CBC WITH DIFFERENTIAL/PLATELET
BASOS ABS: 0.1 10*3/uL (ref 0.0–0.1)
BASOS PCT: 1 %
EOS ABS: 0.4 10*3/uL (ref 0.0–0.5)
Eosinophils Relative: 4 %
HCT: 40.6 % (ref 34.8–46.6)
HEMOGLOBIN: 13.5 g/dL (ref 11.6–15.9)
Lymphocytes Relative: 22 %
Lymphs Abs: 2.4 10*3/uL (ref 0.9–3.3)
MCH: 29.9 pg (ref 25.1–34.0)
MCHC: 33.4 g/dL (ref 31.5–36.0)
MCV: 89.6 fL (ref 79.5–101.0)
MONOS PCT: 5 %
Monocytes Absolute: 0.6 10*3/uL (ref 0.1–0.9)
NEUTROS PCT: 68 %
Neutro Abs: 7.5 10*3/uL — ABNORMAL HIGH (ref 1.5–6.5)
Platelets: 287 10*3/uL (ref 145–400)
RBC: 4.53 MIL/uL (ref 3.70–5.45)
RDW: 14.4 % (ref 11.2–14.5)
WBC: 11 10*3/uL — AB (ref 3.9–10.3)

## 2018-01-09 LAB — COMPREHENSIVE METABOLIC PANEL
ALK PHOS: 70 U/L (ref 38–126)
ALT: 23 U/L (ref 0–44)
ANION GAP: 14 (ref 5–15)
AST: 19 U/L (ref 15–41)
Albumin: 4.1 g/dL (ref 3.5–5.0)
BILIRUBIN TOTAL: 0.7 mg/dL (ref 0.3–1.2)
BUN: 9 mg/dL (ref 6–20)
CALCIUM: 9.5 mg/dL (ref 8.9–10.3)
CO2: 22 mmol/L (ref 22–32)
CREATININE: 0.79 mg/dL (ref 0.44–1.00)
Chloride: 104 mmol/L (ref 98–111)
Glucose, Bld: 200 mg/dL — ABNORMAL HIGH (ref 70–99)
Potassium: 3.6 mmol/L (ref 3.5–5.1)
Sodium: 140 mmol/L (ref 135–145)
TOTAL PROTEIN: 7.8 g/dL (ref 6.5–8.1)

## 2018-01-09 LAB — CEA (IN HOUSE-CHCC): CEA (CHCC-In House): 1.88 ng/mL (ref 0.00–5.00)

## 2018-01-09 MED ORDER — IOHEXOL 300 MG/ML  SOLN
100.0000 mL | Freq: Once | INTRAMUSCULAR | Status: AC | PRN
  Administered 2018-01-09: 100 mL via INTRAVENOUS

## 2018-01-09 NOTE — Progress Notes (Signed)
Taylor Whitney  [phone] Fax:([phone]  Clinic Follow up Note   Patient Care Team: Donald Prose, MD as PCP - General (Family Medicine) Truitt Merle, MD as Consulting Physician (Hematology) Kinsinger, Arta Bruce, MD as Consulting Physician (General Surgery)   Date of Service:  01/11/2018   CHIEF COMPLAINTS:  Follow up stage IIIB colon cancer   Oncology History   Cancer of left colon [facility])   Staging form: Colon and Rectum, AJCC 7th Edition   - Pathologic stage from 01/18/2016: Stage IIIB (T3, N1a, cM0) - Signed by Truitt Merle, MD on 02/01/2016      Cancer of left colon s/p colostomy takedown 05/11/2016   01/17/2016 Initial Diagnosis    Cancer of left colon ([place])    01/17/2016 Procedure    Flexible sigmoidoscopy showed malignant-appearing obstructing tumor in the descending colon, biopsied.    01/18/2016 Surgery    Patient underwent left hemicolectomy     01/18/2016 Pathology Results    Left hemicolectomy showed invasive adenocarcinoma, well differentiated, 4.1 cm, tumor extends into pericolonic soft tissue, lymphovascular invasion is identified, margins were negative, 1 out of 27 lymph nodes were positive.    02/24/2016 - 04/28/2016 Chemotherapy    Adjuvant CAPOX (Xeloda on day 1-14, oxaliplatin on day 1) every 3 weeks, for total of 4 cycles     08/07/2016 Imaging    CT A/P w contrast  IMPRESSION: 1. No complications identified status post partial colectomy. 2. There is a soft tissue attenuating nodule within the peritoneal cavity which is indeterminate. Although this may represent postsurgical change peritoneal metastasis cannot be excluded and close interval follow-up is advised.    10/02/2016 Imaging    CT Abdomen/Pelvis with contrast 10/02/16 IMPRESSION: 1. Partial regression of previously suspected soft tissue nodule in the anterior aspect of the peritoneal cavity. Given the spontaneous regression since March 2018, in the absence of chemotherapy  (last administered in December 2017), this is presumably a benign area of resolving postoperative scarring. No other suspicious findings are noted in the abdomen or pelvis on today's examination to suggest metastatic disease. 2. Borderline splenomegaly. 3. Probable mild hepatic steatosis. 4. Aortic atherosclerosis (mild). 5. Additional incidental findings, similar to prior examinations, as above.     03/08/2017 Procedure    COLONOSCOPY - Two 5 to 7 mm polyps in the transverse colon and in the cecum, removed with a cold snare. Resected and retrieved. - Patent end-to-end colo-colonic anastomosis, characterized by healthy appearing mucosa. - Otherwise normal appearing colon Impression: - Repeat colonoscopy in 2 years for surveillance.    03/08/2017 Pathology Results    Diagnosis (from colonoscopy)  Surgical [P], transverse and cecum, polyp (2) - TUBULAR ADENOMA (TWO FRAGMENTS). - NO HIGH GRADE DYSPLASIA OR MALIGNANCY.    05/11/2017 Imaging    CT CAP w contrast IMPRESSION: 1. No findings of metastatic disease. Further resolution of the previous omental nodularity, which was likely previously postinflammatory. 2. Borderline prominent peripancreatic lymph node is chronically stable and likely benign/incidental. 3. Other imaging findings of potential clinical significance: Several tiny stable ground-glass density nodules in the upper lobes, highly likely to be benign. Diffuse hepatic steatosis. Left mid kidney cyst. Aortic Atherosclerosis (ICD10-I70.0). Subserosal fibroid along the uterine fundus. Central disc protrusions in the lumbar spine. Small supraumbilical hernia.    01/09/2018 Imaging    01/09/2018 CT CAP IMPRESSION: No evidence of recurrent or metastatic carcinoma within the chest, abdomen, or pelvis.  Stable hepatic steatosis.  Pedunculated fibroid in the uterine fundus.  HISTORY OF PRESENTING ILLNESS (02/01/2016):  Taylor Whitney 47 y.o. female is here  because of her recently diagnosed stage IIIB colon cancer, she presents to my clinic by herself, and her two sisters were on the phone during her visit.   She presents to [facility] hospital on 01/16/2016 with nausea and vomiting for one day. She had a CT scan and flexible sigmoidoscopy which showed a near obstructing mass in the descending colon. She was seen by surgeon Dr. Kieth Brightly and underwent left colectomy and end colostomy. There were minor injury to the duodenum during her surgery, postop study showed no leakage from duodenum. She tolerated surgery and recovered well, was discharged home after 9 days of hospital stay.  She has been recovering well from surgery, has minimum pain at incisions, her appetite is still relatively low, her energy level has improved, she table to function and tolerate daily activities without much difficulty. She lost about 20lbs in the past few months.  She was diagnosed with DM 6 months ago, on metformin, does not check her blood glucose at home.  She was diagnosed with HL stage III in 1996, she was diagnosed and treated in [place], s/p chemo ABVD 9 months (bleomycin was held earlier due to pulmonary toxicities), no radiation. She followed with her oncologist for 5 years after treatment.   CURRENT THERAPY: Surveillance  INTERIM HISTORY:  [name] returns for follow-up of colon cancer. She is here alone. She is not complaining of any new symptoms. Her energy level and appetite have returned to normal.  Her BG is high today at 200. She measures her BG at home and she is on Metformin and Januva.    MEDICAL HISTORY:  Past Medical History:  Diagnosis Date  . Adenocarcinoma of descending colon ([place]) 12/2015   Flex sigmoidoscopy biopsy of descending colon mass is adenocarcinoma/notes 01/18/2016  . Anemia   . Cancer [facility])    Hodgkin's lymphoma- Dr. Burr Medico  . Depression   . Family history of kidney cancer   . History of colostomy reversal   . Hodgkin lymphoma ([place]) dx'd  01/1995  . Large bowel obstruction s/p colectomy/colostomy Aug 2017 01/16/2016  . Migraine    "maybe once/month" (01/18/2016)  . Type II diabetes mellitus ([place]) 06/2015    SURGICAL HISTORY: Past Surgical History:  Procedure Laterality Date  . COLON RESECTION N/A 01/18/2016   Procedure: OPEN PARTIAL COLECTOMY WITH END COLOSTOMY;  Surgeon: Mickeal Skinner, MD;  Location: [facility];  Service: General;  Laterality: N/A;  . COLOSTOMY  01/18/2016  . COLOSTOMY TAKEDOWN N/A 05/11/2016   Procedure: LAPAROSCOPIC CONVERTED TO OPEN REVERSAL OF END COLOSTOMY;  Surgeon: Arta Bruce Kinsinger, MD;  Location: [facility];  Service: General;  Laterality: N/A;  . DILATION AND CURETTAGE OF UTERUS    . FLEXIBLE SIGMOIDOSCOPY N/A 01/17/2016   Procedure: FLEXIBLE SIGMOIDOSCOPY;  Surgeon: Ladene Artist, MD;  Location: [facility] ENDOSCOPY;  Service: Endoscopy;  Laterality: N/A;  . LYMPH NODE BIOPSY  1996  . MANDIBLE SURGERY  1988  . PARTIAL COLECTOMY  01/18/2016  . PROCTOSCOPY  05/11/2016   Procedure: PROCTOSCOPY;  Surgeon: Mickeal Skinner, MD;  Location: [facility];  Service: General;;  . WISDOM TOOTH EXTRACTION  1990    SOCIAL HISTORY: Social History   Socioeconomic History  . Marital status: Single    Spouse name: Not on file  . Number of children: 0  . Years of education: Not on file  . Highest education level: Not on file  Occupational History  .  Occupation: Professor    Comment: [employer]  . Financial resource strain: Not on file  . Food insecurity:    Worry: Not on file    Inability: Not on file  . Transportation needs:    Medical: Not on file    Non-medical: Not on file  Tobacco Use  . Smoking status: Never Smoker  . Smokeless tobacco: Never Used  Substance and Sexual Activity  . Alcohol use: Yes    Comment: rare social; once socially  . Drug use: No  . Sexual activity: Not Currently  Lifestyle  . Physical activity:    Days per week: Not on file    Minutes per  session: Not on file  . Stress: Not on file  Relationships  . Social connections:    Talks on phone: Not on file    Gets together: Not on file    Attends religious service: Not on file    Active member of club or organization: Not on file    Attends meetings of clubs or organizations: Not on file    Relationship status: Not on file  . Intimate partner violence:    Fear of current or ex partner: Not on file    Emotionally abused: Not on file    Physically abused: Not on file    Forced sexual activity: Not on file  Other Topics Concern  . 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 Diabetes Mother   . Cancer Father        prostate cancer  . Cancer Paternal Uncle        renal cancer ; smoker  . Diabetes Maternal Grandmother   . Diabetes Maternal Grandfather   . Clotting disorder Paternal Grandmother   . COPD Paternal Grandfather   . Colon cancer Neg Hx     ALLERGIES:  is allergic to sulfa antibiotics.  MEDICATIONS:  Current Outpatient Medications  Medication Sig Dispense Refill  . ibuprofen (ADVIL,MOTRIN) 800 MG tablet Take 1 tablet (800 mg total) by mouth every 8 (eight) hours as needed. 30 tablet 0  . JANUVIA 100 MG tablet TK 1 T PO QD  1  . metFORMIN (GLUCOPHAGE-XR) 500 MG 24 hr tablet Take 500 mg by mouth 2 (two) times daily.  0  . venlafaxine XR (EFFEXOR-XR) 75 MG 24 hr capsule Take 225 mg by mouth at bedtime.  5   No current facility-administered medications for this visit.     REVIEW OF SYSTEMS:   Constitutional: Denies fevers, chills or abnormal night sweats  (+) energy and appetite improved Eyes: Denies blurriness of vision, double vision or watery eyes Ears, nose, mouth, throat, and face: Denies mucositis or sore  throat Respiratory: Denies cough, dyspnea or wheezes Cardiovascular: Denies palpitation, chest discomfort or lower extremity swelling Gastrointestinal:  Denies nausea, heartburn or change in bowel habits Skin: Denies abnormal skin rashes Lymphatics: Denies new lymphadenopathy or easy bruising Neurological:Denies numbness, tingling or new weaknesses Behavioral/Psych: Mood is stable, no new changes  All other systems were reviewed with the patient and are negative.  PHYSICAL EXAMINATION:  ECOG PERFORMANCE STATUS: 0  Vitals:   01/11/18 0822  BP: (!) 143/94  Pulse: 94  Resp: (!) 22  Temp: 98 F (36.7 C)  SpO2: 100%   Filed Weights   01/11/18 2130  Weight: 173 lb 12.8 oz (78.8 kg)    GENERAL:alert, no distress and comfortable SKIN: skin color, texture, turgor are normal, no rashes or significant lesions  EYES: normal, conjunctiva are pink and non-injected, sclera clear OROPHARYNX:no exudate, no erythema and lips, buccal mucosa, and tongue normal  NECK: supple, thyroid normal size, non-tender, without nodularity LYMPH:  no palpable lymphadenopathy in the cervical, axillary or inguinal LUNGS: clear to auscultation and percussion with normal breathing effort HEART: regular rate & rhythm and no murmurs and no lower extremity edema ABDOMEN:abdomen soft, non-tender and normal bowel sounds, Surgical scar has well-healed, no tenderness  Musculoskeletal:no cyanosis of digits and no clubbing  PSYCH: alert & oriented x 3 with fluent speech NEURO: no focal motor/sensory deficits  LABORATORY DATA:  I have reviewed the data as listed CBC Latest Ref Rng & Units 01/09/2018 09/18/2017 05/10/2017  WBC 3.9 - 10.3 K/uL 11.0(H) 8.2 8.7  Hemoglobin 11.6 - 15.9 g/dL 13.5 13.9 13.3  Hematocrit 34.8 - 46.6 % 40.6 41.2 39.9  Platelets 145 - 400 K/uL 287 272 265   CMP Latest Ref Rng & Units 01/09/2018 09/18/2017 05/10/2017  Glucose 70 - 99 mg/dL 200(H) 312(H) 253(H)  BUN 6 - 20 mg/dL 9 8 10.7   Creatinine 0.44 - 1.00 mg/dL 0.79 0.95 0.8  Sodium 135 - 145 mmol/L 140 136 137  Potassium 3.5 - 5.1 mmol/L 3.6 3.7 3.5  Chloride 98 - 111 mmol/L 104 100 -  CO2 22 - 32 mmol/L _0 Calcium 8.9 - 10.3 mg/dL 9.5 10.0 9.3  Total Protein 6.5 - 8.1 g/dL 7.8 8.0 7.7  Total Bilirubin 0.3 - 1.2 mg/dL 0.7 0.7 0.58  Alkaline Phos 38 - 126 U/L 70 85 91  AST 15 - 41 U/L _1 ALT 0 - 44 U/L _2 Tumor Marker CEA Results for SRUTI, AYLLON (MRN [id number]) as of 01/09/2018 10:04  Ref. Range 08/07/2016 09:06 01/04/2017 08:36 05/10/2017 13:32 09/18/2017 11:30 01/09/2018 08:31  CEA ([facility]) Latest Ref Range: 0.00 - 5.00 ng/mL 1.59 1.25 1.72 1.87 1.88    PATHOLOGY REPORT   Diagnosis 01/18/2016 Colon, segmental resection for tumor, Left - INVASIVE ADENOCARCINOMA, WELL DIFFERENTIATED, SPANNING 4.1 CM. - ADENOCARCINOMA EXTENDS INTO PERICOLONIC SOFT TISSUE. - LYMPHOVASCULAR INVASION IS IDENTIFIED. - THE SURGICAL RESECTION MARGINS ARE NEGATIVE FOR CARCINOMA. - METASTATIC CARCINOMA IN 1 OF 27 LYMPH NODES (1/27). - DIVERTICULAR DISEASE (GROSS DIAGNOSIS). - SEE ONCOLOGY TABLE BELOW.  Diagnosis 01/17/2016 Colon, biopsy, descending - POSITIVE FOR ADENOCARCINOMA.  RADIOGRAPHIC STUDIES: I have personally reviewed the radiological images as listed and agreed with the findings in the report.  01/09/2018 CT CAP IMPRESSION: No evidence of recurrent or metastatic carcinoma within the chest, abdomen, or pelvis.  Stable hepatic steatosis.  Pedunculated fibroid in the uterine fundus.  Sigmoidoscopy 01/17/2016 - Malignant appearing, obstructing tumor in the descending colon. Biopsied. - Otherwise normal flex sig however limited by poor prep.  CT Abdomen/Pelvis with contrast 08/07/16 IMPRESSION: 1. No complications identified status post partial colectomy. 2. There is a soft tissue attenuating nodule within the peritoneal cavity which is indeterminate. Although this may  represent postsurgical change peritoneal metastasis cannot be excluded and close interval follow-up is advised.  CT Abdomen/Pelvis with contrast 10/02/16 IMPRESSION: 1. Partial regression of previously suspected soft tissue nodule in the anterior aspect of the peritoneal cavity. Given  the spontaneous regression since March 2018, in the absence of chemotherapy (last administered in December 2017), this is presumably a benign area of resolving postoperative scarring. No other suspicious findings are noted in the abdomen or pelvis on today's examination to suggest metastatic disease. 2. Borderline splenomegaly. 3. Probable mild hepatic steatosis. 4. Aortic atherosclerosis (mild). 5. Additional incidental findings, similar to prior examinations, as Above.    CT CAP IMPRESSION: 05/10/17 1. No findings of metastatic disease. Further resolution of the previous omental nodularity, which was likely previously postinflammatory. 2. Borderline prominent peripancreatic lymph node is chronically stable and likely benign/incidental. 3. Other imaging findings of potential clinical significance: Several tiny stable ground-glass density nodules in the upper lobes, highly likely to be benign. Diffuse hepatic steatosis. Left mid kidney cyst. Aortic Atherosclerosis (ICD10-I70.0). Subserosal fibroid along the uterine fundus. Central disc protrusions in the lumbar spine. Small supraumbilical hernia.    ASSESSMENT & PLAN:   47 y.o.  with past medical history of diabetes, remote history of Hodgkin lymphoma, presented with bowel obstruction.  1. Cancer of left colon, invasive adenocarcinoma, well-differentiated, pT3N1aM0, stage IIIB, MSI-stable  -I previously reviewed her surgical pathology findings with patient and her sisters in great details -I previously reviewed her staging CT scans which showed no evidence of metastasis. -We previously discussed risk of cancer recurrence after complete surgical  resection. Given her positive lymph nodes, stage IIIB disease, LVI (+), she is at high risk for recurrence.  -she is s/p surgical resection and adjuvant CAPOX x3 months completed 04/28/2016. - CT scan from 08/07/16 revealed a new 1.7 cm soft tissue mass in the peritoneum, possible scar tissue versus peritoneal metastasis.  -I reviewed her follow-up CT scan from 10/02/2016, which showed significantly decrease the size of the soft tissue mass in the peritoneum, likely benign. No other evidence of metastasis. -We reviewed 02/2017 colonoscopy; 2 sessile polyps were removed, pathology reveals tubular adenoma, negative for dysplasia or malignancy. Repeat colonoscopy in 2 years per GI.  -04/2017 surveillance CT CAP revealed no evidence of recurrence or metastatic disease. Previous omental nodularity has further decreased, borderline prominent peripancreatic lymph node is chronically stable and likely benign. Incidental finding of ground-glass lung nodules are highly likely to be benign.  -I reviewed her restaging CT scan from January 09, 2018, which showed no evidence of recurrence or metastasis. -Very well, labs and exam were unremarkable, reviewed with patient. -Out of her initial diagnosis, her risk of recurrence has decreased, will continue surveillance for total of 5 years. -Follow up in 4 months  2. Iron deficient anemia -Her initial iron study showed iron deficiency, probably from her previous GI bleeding from her colon cancer. -Her anemia has resolved   3. 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  This deletion is likely pathogenic with respect to EPCAM function, but it IS  NOT expected to cause Lynch syndrome.  Two pathogenic mutations in EPCAM can cause a rare autosomal recessive condition called congenital diarrhea with tufting enteropathy (CTE).  The Colorectal Cancer Panel offered by [facility] includes sequencing and/or duplication/deletion testing of the following 19 genes: APC, ATM, AXIN2, BMPR1A, CDH1, CHEK2, EPCAM, MLH1, MSH2, MSH6, MUTYH, PMS2, POLD1, POLE, PTEN, SCG5/GREM1,  SMAD4, STK11, and TP53. The report date is March 27, 2016.  4. DM and overweight  -She was diagnosed with diabetes in earlier 2017. She is on metformin once daily  -I previously encouraged her to monitor her blood sugar at home -She'll continue follow-up with her PCP -Her sugar was 175 on 01/04/2017 and she returned to taking metformin twice a day.  -Her Sugar is elevated at 200 today, this is not well controlled. I advised her to see her PCP and talk to a dietician  -I recommend she make a log of her BG levels throughout the day.  -She has been working to lose weight. I recommend she continue to lose a few pounds a month -I will refer her to diabetes and nutrition clinic  5. Hypertension  -She denies history of hypertension, and states her blood pressure has been normal at home.  Her BP has been increasing at our clinic visits, 143/94 today. - I suggest she monitor her BP at home and if remains high she should see her PCP. I advised her to lower salt intake and drink plenty of water.    6. Cancer screening  -Pt notes she had a mammogram in 06/2017 at [facility] which showed 2 lesions in her right breast that were biopsied and found to be benign.  I encouraged her to continue annual mammogram.  PLAN -Lab and scan reviewed, no evidence of recurrence  -lab and f/u in 4 months -Refer to diabetes and nutrition clinic     All questions were answered. The patient knows to call the clinic with any problems, questions or concerns. I spent 20 minutes counseling the patient face to face.  The total time spent in the appointment was 25 minutes and more than 50% was on counseling.  Dierdre Searles Dweik am acting as scribe for Dr. Truitt Merle.  I have reviewed the above documentation for accuracy and completeness, and I agree with the above.    Truitt Merle 01/11/2018

## 2018-01-11 ENCOUNTER — Other Ambulatory Visit: Payer: BLUE CROSS/BLUE SHIELD

## 2018-01-11 ENCOUNTER — Inpatient Hospital Stay: Payer: BLUE CROSS/BLUE SHIELD | Admitting: Hematology

## 2018-01-11 ENCOUNTER — Telehealth: Payer: Self-pay | Admitting: Hematology

## 2018-01-11 ENCOUNTER — Encounter: Payer: Self-pay | Admitting: Hematology

## 2018-01-11 VITALS — BP 143/94 | HR 94 | Temp 98.0°F | Resp 22 | Ht 64.0 in | Wt 173.8 lb

## 2018-01-11 DIAGNOSIS — E663 Overweight: Secondary | ICD-10-CM

## 2018-01-11 DIAGNOSIS — Z9221 Personal history of antineoplastic chemotherapy: Secondary | ICD-10-CM

## 2018-01-11 DIAGNOSIS — E119 Type 2 diabetes mellitus without complications: Secondary | ICD-10-CM

## 2018-01-11 DIAGNOSIS — I10 Essential (primary) hypertension: Secondary | ICD-10-CM | POA: Diagnosis not present

## 2018-01-11 DIAGNOSIS — E118 Type 2 diabetes mellitus with unspecified complications: Secondary | ICD-10-CM

## 2018-01-11 DIAGNOSIS — Z8051 Family history of malignant neoplasm of kidney: Secondary | ICD-10-CM

## 2018-01-11 DIAGNOSIS — Z7984 Long term (current) use of oral hypoglycemic drugs: Secondary | ICD-10-CM

## 2018-01-11 DIAGNOSIS — Z8571 Personal history of Hodgkin lymphoma: Secondary | ICD-10-CM | POA: Diagnosis not present

## 2018-01-11 DIAGNOSIS — C186 Malignant neoplasm of descending colon: Secondary | ICD-10-CM

## 2018-01-11 DIAGNOSIS — D509 Iron deficiency anemia, unspecified: Secondary | ICD-10-CM | POA: Diagnosis not present

## 2018-01-11 DIAGNOSIS — Z79899 Other long term (current) drug therapy: Secondary | ICD-10-CM | POA: Diagnosis not present

## 2018-01-11 NOTE — Telephone Encounter (Signed)
Appts scheduled AVS/Calendar printed per 8/16 los °

## 2018-01-11 NOTE — Addendum Note (Signed)
Addended by: Truitt Merle on: 01/11/2018 12:41 PM   Modules accepted: Orders

## 2018-01-15 ENCOUNTER — Telehealth: Payer: Self-pay

## 2018-01-15 NOTE — Telephone Encounter (Signed)
Left voice message for patient per Dr. Burr Medico CEA was normal, no concerns.

## 2018-01-15 NOTE — Telephone Encounter (Signed)
-----   Message from Truitt Merle, MD sent at 01/14/2018  7:33 AM EDT ----- Please let pt know her tumor marker CEA was normal last week, thanks  Truitt Merle  01/14/2018

## 2018-01-16 ENCOUNTER — Telehealth: Payer: Self-pay | Admitting: Hematology

## 2018-01-16 NOTE — Telephone Encounter (Signed)
Left message for patient re cancellation of 9/3 nutrition appointment at [facility] and referral to [facility] - center will contact patient. Also confirmed December lab/fu and provided information to follow up with center is she does not her from them soon. Appointment cancelled per 8/16 schedule message from [facility] - YF entered new referral.

## 2018-01-29 ENCOUNTER — Encounter: Payer: BLUE CROSS/BLUE SHIELD | Admitting: Nutrition

## 2018-05-12 NOTE — Progress Notes (Signed)
[place]   [phone] [phone]   Clinic Follow up Note   Patient Care Team: Donald Prose, MD as PCP - General (Family Medicine) Truitt Merle, MD as Consulting Physician (Hematology) Kinsinger, Arta Bruce, MD as Consulting Physician (General Surgery) 05/13/2018  CHIEF COMPLAINT: F/u on stage IIIB colon cancer   SUMMARY OF ONCOLOGIC HISTORY: Oncology History   Cancer of left colon [facility])   Staging form: Colon and Rectum, AJCC 7th Edition   - Pathologic stage from 01/18/2016: Stage IIIB (T3, N1a, cM0) - Signed by Truitt Merle, MD on 02/01/2016      Cancer of left colon s/p colostomy takedown 05/11/2016   01/17/2016 Initial Diagnosis    Cancer of left colon ([place])    01/17/2016 Procedure    Flexible sigmoidoscopy showed malignant-appearing obstructing tumor in the descending colon, biopsied.    01/18/2016 Surgery    Patient underwent left hemicolectomy     01/18/2016 Pathology Results    Left hemicolectomy showed invasive adenocarcinoma, well differentiated, 4.1 cm, tumor extends into pericolonic soft tissue, lymphovascular invasion is identified, margins were negative, 1 out of 27 lymph nodes were positive.    02/24/2016 - 04/28/2016 Chemotherapy    Adjuvant CAPOX (Xeloda on day 1-14, oxaliplatin on day 1) every 3 weeks, for total of 4 cycles     08/07/2016 Imaging    CT A/P w contrast  IMPRESSION: 1. No complications identified status post partial colectomy. 2. There is a soft tissue attenuating nodule within the peritoneal cavity which is indeterminate. Although this may represent postsurgical change peritoneal metastasis cannot be excluded and close interval follow-up is advised.    10/02/2016 Imaging    CT Abdomen/Pelvis with contrast 10/02/16 IMPRESSION: 1. Partial regression of previously suspected soft tissue nodule in the anterior aspect of the peritoneal cavity. Given the spontaneous regression since March 2018, in the absence of  chemotherapy (last administered in December 2017), this is presumably a benign area of resolving postoperative scarring. No other suspicious findings are noted in the abdomen or pelvis on today's examination to suggest metastatic disease. 2. Borderline splenomegaly. 3. Probable mild hepatic steatosis. 4. Aortic atherosclerosis (mild). 5. Additional incidental findings, similar to prior examinations, as above.     03/08/2017 Procedure    COLONOSCOPY - Two 5 to 7 mm polyps in the transverse colon and in the cecum, removed with a cold snare. Resected and retrieved. - Patent end-to-end colo-colonic anastomosis, characterized by healthy appearing mucosa. - Otherwise normal appearing colon Impression: - Repeat colonoscopy in 2 years for surveillance.    03/08/2017 Pathology Results    Diagnosis (from colonoscopy)  Surgical [P], transverse and cecum, polyp (2) - TUBULAR ADENOMA (TWO FRAGMENTS). - NO HIGH GRADE DYSPLASIA OR MALIGNANCY.    05/11/2017 Imaging    CT CAP w contrast IMPRESSION: 1. No findings of metastatic disease. Further resolution of the previous omental nodularity, which was likely previously postinflammatory. 2. Borderline prominent peripancreatic lymph node is chronically stable and likely benign/incidental. 3. Other imaging findings of potential clinical significance: Several tiny stable ground-glass density nodules in the upper lobes, highly likely to be benign. Diffuse hepatic steatosis. Left mid kidney cyst. Aortic Atherosclerosis (ICD10-I70.0). Subserosal fibroid along the uterine fundus. Central disc protrusions in the lumbar spine. Small supraumbilical hernia.    08/30/2017 Mammogram    08/30/2017 Mammogram and breast US benign breast tissue.The benign pathology is concordant with prebiopsy imaging.    01/09/2018 Imaging    01/09/2018 CT CAP IMPRESSION: No evidence of recurrent  or metastatic carcinoma within the chest, abdomen, or pelvis.  Stable  hepatic steatosis.  Pedunculated fibroid in the uterine fundus.     CURRENT THERAPY Surveillance   INTERVAL HISTORY: Taylor Whitney is a 47 y.o. female who is here for follow-up. Today, she is here alone. She is doing well and denies abdominal pain or changes in BM. Her BG is 130 in the morning, she doesn't check after meals.     Pertinent positives and negatives of review of systems are listed and detailed within the above HPI.  REVIEW OF SYSTEMS:   Constitutional: Denies fevers, chills or abnormal weight loss Eyes: Denies blurriness of vision Ears, nose, mouth, throat, and face: Denies mucositis or sore throat Respiratory: Denies cough, dyspnea or wheezes Cardiovascular: Denies palpitation, chest discomfort or lower extremity swelling Gastrointestinal:  Denies nausea, heartburn or change in bowel habits Skin: Denies abnormal skin rashes Lymphatics: Denies new lymphadenopathy or easy bruising Neurological:Denies numbness, tingling or new weaknesses Behavioral/Psych: Mood is stable, no new changes  All other systems were reviewed with the patient and are negative.  MEDICAL HISTORY:  Past Medical History:  Diagnosis Date  . Adenocarcinoma of descending colon ([place]) 12/2015   Flex sigmoidoscopy biopsy of descending colon mass is adenocarcinoma/notes 01/18/2016  . Anemia   . Cancer [facility])    Hodgkin's lymphoma- Dr. Burr Medico  . Depression   . Family history of kidney cancer   . History of colostomy reversal   . Hodgkin lymphoma ([place]) dx'd 01/1995  . Large bowel obstruction s/p colectomy/colostomy Aug 2017 01/16/2016  . Migraine    "maybe once/month" (01/18/2016)  . Type II diabetes mellitus ([place]) 06/2015    SURGICAL HISTORY: Past Surgical History:  Procedure Laterality Date  . COLON RESECTION N/A 01/18/2016   Procedure: OPEN PARTIAL COLECTOMY WITH END COLOSTOMY;  Surgeon: Mickeal Skinner, MD;  Location: [place];  Service: General;  Laterality: N/A;  . COLOSTOMY   01/18/2016  . COLOSTOMY TAKEDOWN N/A 05/11/2016   Procedure: LAPAROSCOPIC CONVERTED TO OPEN REVERSAL OF END COLOSTOMY;  Surgeon: Arta Bruce Kinsinger, MD;  Location: [facility];  Service: General;  Laterality: N/A;  . DILATION AND CURETTAGE OF UTERUS    . FLEXIBLE SIGMOIDOSCOPY N/A 01/17/2016   Procedure: FLEXIBLE SIGMOIDOSCOPY;  Surgeon: Ladene Artist, MD;  Location[facility] ENDOSCOPY;  Service: Endoscopy;  Laterality: N/A;  . LYMPH NODE BIOPSY  1996  . MANDIBLE SURGERY  1988  . PARTIAL COLECTOMY  01/18/2016  . PROCTOSCOPY  05/11/2016   Procedure: PROCTOSCOPY;  Surgeon: Mickeal Skinner, MD;  Location: [facility];  Service: General;;  . [place]    I have reviewed the social history and family history with the patient and they are unchanged from previous note.  ALLERGIES:  is allergic to sulfa antibiotics.  MEDICATIONS:  Current Outpatient Medications  Medication Sig Dispense Refill  . ibuprofen (ADVIL,MOTRIN) 800 MG tablet Take 1 tablet (800 mg total) by mouth every 8 (eight) hours as needed. 30 tablet 0  . JANUVIA 100 MG tablet TK 1 T PO QD  1  . metFORMIN (GLUCOPHAGE-XR) 500 MG 24 hr tablet Take 500 mg by mouth 2 (two) times daily.  0  . venlafaxine XR (EFFEXOR-XR) 75 MG 24 hr capsule Take 225 mg by mouth at bedtime.  5   No current facility-administered medications for this visit.     PHYSICAL EXAMINATION: ECOG PERFORMANCE STATUS: 0 - Asymptomatic  Vitals:   05/13/18 0935 05/13/18 0937  BP: (!) 142/101 Marland Kitchen)  140/99  Pulse: 97   Resp: 20   Temp: 98.2 F (36.8 C)   SpO2: 100%    Filed Weights   05/13/18 0935  Weight: 172 lb 14.4 oz (78.4 kg)    GENERAL:alert, no distress and comfortable SKIN: skin color, texture, turgor are normal, no rashes or significant lesions EYES: normal, Conjunctiva are pink and non-injected, sclera clear OROPHARYNX:no exudate, no erythema and lips, buccal mucosa, and tongue normal  NECK: supple, thyroid normal size, non-tender,  without nodularity LYMPH:  no palpable lymphadenopathy in the cervical, axillary or inguinal LUNGS: clear to auscultation and percussion with normal breathing effort HEART: regular rate & rhythm and no murmurs and no lower extremity edema ABDOMEN:abdomen soft, non-tender and normal bowel sounds Musculoskeletal:no cyanosis of digits and no clubbing  NEURO: alert & oriented x 3 with fluent speech, no focal motor/sensory deficits  LABORATORY DATA:  I have reviewed the data as listed CBC Latest Ref Rng & Units 05/13/2018 01/09/2018 09/18/2017  WBC 4.0 - 10.5 K/uL 10.1 11.0(H) 8.2  Hemoglobin 12.0 - 15.0 g/dL 13.8 13.5 13.9  Hematocrit 36.0 - 46.0 % 41.5 40.6 41.2  Platelets 150 - 400 K/uL 311 287 272     CMP Latest Ref Rng & Units 05/13/2018 01/09/2018 09/18/2017  Glucose 70 - 99 mg/dL 131(H) 200(H) 312(H)  BUN 6 - 20 mg/dL '10 9 8  ' Creatinine 0.44 - 1.00 mg/dL 0.81 0.79 0.95  Sodium 135 - 145 mmol/L 140 140 136  Potassium 3.5 - 5.1 mmol/L 3.8 3.6 3.7  Chloride 98 - 111 mmol/L 103 104 100  CO2 22 - 32 mmol/L '22 22 22  ' Calcium 8.9 - 10.3 mg/dL 9.8 9.5 10.0  Total Protein 6.5 - 8.1 g/dL 8.0 7.8 8.0  Total Bilirubin 0.3 - 1.2 mg/dL 0.7 0.7 0.7  Alkaline Phos 38 - 126 U/L 71 70 85  AST 15 - 41 U/L '18 19 22  ' ALT 0 - 44 U/L '20 23 27      ' RADIOGRAPHIC STUDIES: I have personally reviewed the radiological images as listed and agreed with the findings in the report. No results found.   08/30/2017 Mammogram and breast US benign breast tissue.The benign pathology is concordant with prebiopsy imaging.   ASSESSMENT & PLAN:  MARCILLE BARMAN is a 47 y.o. female with history of  1. Cancer of left colon, invasive adenocarcinoma, well-differentiated, pT3N1aM0, stage IIIB, MSI-stable  -Diagnosed in 12/2015. Treated with surgery and adjuvant chemo CAPOX for 3 months. Currently on surveillance.  -She is clinically doing very well, asymptomatic, exam was unremarkable.  Lab reviewed, CBC and CMP  are within normal limits, CEA still pending, no clinical concern of disease recurrence so far. -Last surveillance CT scan from August 2019 was negative -She will repeat colonoscopy next year. -f/u in 4 months. Will order last CT scan next visit. -Continue colon cancer surveillance for total 5 years  2. DM and overweight  -Currently on Metformin once daily. -I previously referred her to a diabetes and nutrition clinic. -Again encouraged her to watch her diet, and exercise regularly, and try to lose weight.  3. Hypertension  -She has not been formally diagnosed with hypertension, but blood pressure high today -she is currently not on BP medications. -I advised her to see her PCP for this and educated her about how DM and being overweight can contribute to risk of cardiovascular disease. I also advised her to maintain a healthy diet and exercise.   4. Cancer screening  -we discussed cancer  screening .   -I reviewed and discussed her 08/2017 mammogram and breast US. She is overdue to 6 months recommended f/u. She will call to schedule   5. History of HL, stage III -She was diagnosed and treated in [place] in 1996, status post chemotherapy ABVD 9 months, bleomycin was held earlier due to pulmonary toxicities.  No radiation. -No evidence of recurrence.  Plan  -repeat colonoscopy next year -f/u in 4 months with labs. Will order CT scan next visit. -She will f/u with [facility] for breast US and mammogram  No problem-specific Assessment & Plan notes found for this encounter.   No orders of the defined types were placed in this encounter.  All questions were answered. The patient knows to call the clinic with any problems, questions or concerns. No barriers to learning was detected. I spent 20 minutes counseling the patient face to face. The total time spent in the appointment was 25 minutes and more than 50% was on counseling and review of test results  I, Noor Dweik am acting as scribe for  Dr. Truitt Merle.  I have reviewed the above documentation for accuracy and completeness, and I agree with the above.     Truitt Merle, MD 05/13/2018

## 2018-05-13 ENCOUNTER — Inpatient Hospital Stay: Payer: BLUE CROSS/BLUE SHIELD | Attending: Hematology

## 2018-05-13 ENCOUNTER — Telehealth: Payer: Self-pay | Admitting: Hematology

## 2018-05-13 ENCOUNTER — Inpatient Hospital Stay: Payer: BLUE CROSS/BLUE SHIELD | Admitting: Hematology

## 2018-05-13 VITALS — BP 140/99 | HR 97 | Temp 98.2°F | Resp 20 | Ht 64.0 in | Wt 172.9 lb

## 2018-05-13 DIAGNOSIS — D649 Anemia, unspecified: Secondary | ICD-10-CM | POA: Insufficient documentation

## 2018-05-13 DIAGNOSIS — Z79899 Other long term (current) drug therapy: Secondary | ICD-10-CM | POA: Insufficient documentation

## 2018-05-13 DIAGNOSIS — Z85038 Personal history of other malignant neoplasm of large intestine: Secondary | ICD-10-CM

## 2018-05-13 DIAGNOSIS — E663 Overweight: Secondary | ICD-10-CM | POA: Insufficient documentation

## 2018-05-13 DIAGNOSIS — Z933 Colostomy status: Secondary | ICD-10-CM | POA: Insufficient documentation

## 2018-05-13 DIAGNOSIS — Z9221 Personal history of antineoplastic chemotherapy: Secondary | ICD-10-CM

## 2018-05-13 DIAGNOSIS — I10 Essential (primary) hypertension: Secondary | ICD-10-CM

## 2018-05-13 DIAGNOSIS — Z9049 Acquired absence of other specified parts of digestive tract: Secondary | ICD-10-CM | POA: Diagnosis not present

## 2018-05-13 DIAGNOSIS — C186 Malignant neoplasm of descending colon: Secondary | ICD-10-CM

## 2018-05-13 DIAGNOSIS — E119 Type 2 diabetes mellitus without complications: Secondary | ICD-10-CM | POA: Insufficient documentation

## 2018-05-13 DIAGNOSIS — E118 Type 2 diabetes mellitus with unspecified complications: Secondary | ICD-10-CM

## 2018-05-13 LAB — CBC WITH DIFFERENTIAL/PLATELET
Abs Immature Granulocytes: 0.04 10*3/uL (ref 0.00–0.07)
BASOS ABS: 0.1 10*3/uL (ref 0.0–0.1)
Basophils Relative: 1 %
EOS ABS: 0.2 10*3/uL (ref 0.0–0.5)
EOS PCT: 2 %
HEMATOCRIT: 41.5 % (ref 36.0–46.0)
HEMOGLOBIN: 13.8 g/dL (ref 12.0–15.0)
Immature Granulocytes: 0 %
LYMPHS ABS: 2.5 10*3/uL (ref 0.7–4.0)
Lymphocytes Relative: 25 %
MCH: 29.1 pg (ref 26.0–34.0)
MCHC: 33.3 g/dL (ref 30.0–36.0)
MCV: 87.6 fL (ref 80.0–100.0)
MONO ABS: 0.5 10*3/uL (ref 0.1–1.0)
Monocytes Relative: 4 %
NRBC: 0 % (ref 0.0–0.2)
Neutro Abs: 6.9 10*3/uL (ref 1.7–7.7)
Neutrophils Relative %: 68 %
Platelets: 311 10*3/uL (ref 150–400)
RBC: 4.74 MIL/uL (ref 3.87–5.11)
RDW: 13.6 % (ref 11.5–15.5)
WBC: 10.1 10*3/uL (ref 4.0–10.5)

## 2018-05-13 LAB — COMPREHENSIVE METABOLIC PANEL
ALK PHOS: 71 U/L (ref 38–126)
ALT: 20 U/L (ref 0–44)
AST: 18 U/L (ref 15–41)
Albumin: 4.1 g/dL (ref 3.5–5.0)
Anion gap: 15 (ref 5–15)
BUN: 10 mg/dL (ref 6–20)
CALCIUM: 9.8 mg/dL (ref 8.9–10.3)
CO2: 22 mmol/L (ref 22–32)
Chloride: 103 mmol/L (ref 98–111)
Creatinine, Ser: 0.81 mg/dL (ref 0.44–1.00)
Glucose, Bld: 131 mg/dL — ABNORMAL HIGH (ref 70–99)
Potassium: 3.8 mmol/L (ref 3.5–5.1)
SODIUM: 140 mmol/L (ref 135–145)
Total Bilirubin: 0.7 mg/dL (ref 0.3–1.2)
Total Protein: 8 g/dL (ref 6.5–8.1)

## 2018-05-13 LAB — CEA (IN HOUSE-CHCC): CEA (CHCC-In House): 1.66 ng/mL (ref 0.00–5.00)

## 2018-05-13 NOTE — Telephone Encounter (Signed)
Gave patient avs report and appointments for April  °

## 2018-05-15 ENCOUNTER — Telehealth: Payer: Self-pay

## 2018-05-15 NOTE — Telephone Encounter (Signed)
-----   Message from Truitt Merle, MD sent at 05/15/2018  7:18 AM EST ----- Please let pt know her CEA was normal, thanks   Truitt Merle  05/15/2018

## 2018-05-15 NOTE — Telephone Encounter (Signed)
Left voice message for patient per Dr. Burr Medico that her CEA is normal.

## 2018-07-31 DIAGNOSIS — R102 Pelvic and perineal pain: Secondary | ICD-10-CM | POA: Diagnosis not present

## 2018-07-31 DIAGNOSIS — D219 Benign neoplasm of connective and other soft tissue, unspecified: Secondary | ICD-10-CM | POA: Diagnosis not present

## 2018-08-06 DIAGNOSIS — Z8571 Personal history of Hodgkin lymphoma: Secondary | ICD-10-CM | POA: Diagnosis not present

## 2018-08-06 DIAGNOSIS — E119 Type 2 diabetes mellitus without complications: Secondary | ICD-10-CM | POA: Diagnosis not present

## 2018-08-06 DIAGNOSIS — F325 Major depressive disorder, single episode, in full remission: Secondary | ICD-10-CM | POA: Diagnosis not present

## 2018-08-06 DIAGNOSIS — Z23 Encounter for immunization: Secondary | ICD-10-CM | POA: Diagnosis not present

## 2018-08-06 DIAGNOSIS — G43909 Migraine, unspecified, not intractable, without status migrainosus: Secondary | ICD-10-CM | POA: Diagnosis not present

## 2018-08-14 DIAGNOSIS — D219 Benign neoplasm of connective and other soft tissue, unspecified: Secondary | ICD-10-CM | POA: Diagnosis not present

## 2018-08-14 DIAGNOSIS — R102 Pelvic and perineal pain: Secondary | ICD-10-CM | POA: Diagnosis not present

## 2018-09-11 ENCOUNTER — Telehealth: Payer: Self-pay | Admitting: Hematology

## 2018-09-11 NOTE — Telephone Encounter (Signed)
Called patient regarding upcoming appointment regarding Webex, left patient a voicemail and sent patient Webex e-mail.

## 2018-09-11 NOTE — Progress Notes (Signed)
Taylor Whitney   [phone] [phone]   Clinic Follow up Note   Patient Care Team: Donald Prose, MD as PCP - General (Family Medicine) Truitt Merle, MD as Consulting Physician (Hematology) Kinsinger, Arta Bruce, MD as Consulting Physician (General Surgery)   I connected with Taylor Whitney on 09/12/2018 at  1:00 PM EDT by video enabled telemedicine visit and verified that I am speaking with the correct person using two identifiers.   I discussed the limitations, risks, security and privacy concerns of performing an evaluation and management service by telephone and the availability of in person appointments. I also discussed with the patient that there may be a patient responsible charge related to this service. The patient expressed understanding and agreed to proceed.   Patient's location:  Her home Provider's location:  My Office   CHIEF COMPLAINT: F/u on stage IIIB colon cancer  SUMMARY OF ONCOLOGIC HISTORY: Oncology History   Cancer of left colon [facility])   Staging form: Colon and Rectum, AJCC 7th Edition   - Pathologic stage from 01/18/2016: Stage IIIB (T3, N1a, cM0) - Signed by Truitt Merle, MD on 02/01/2016      Cancer of left colon s/p colostomy takedown 05/11/2016   01/17/2016 Initial Diagnosis    Cancer of left colon ([place])    01/17/2016 Procedure    Flexible sigmoidoscopy showed malignant-appearing obstructing tumor in the descending colon, biopsied.    01/18/2016 Surgery    Patient underwent left hemicolectomy     01/18/2016 Pathology Results    Left hemicolectomy showed invasive adenocarcinoma, well differentiated, 4.1 cm, tumor extends into pericolonic soft tissue, lymphovascular invasion is identified, margins were negative, 1 out of 27 lymph nodes were positive.    02/24/2016 - 04/28/2016 Chemotherapy    Adjuvant CAPOX (Xeloda on day 1-14, oxaliplatin on day 1) every 3 weeks, for total of 4 cycles     08/07/2016 Imaging    CT A/P w  contrast  IMPRESSION: 1. No complications identified status post partial colectomy. 2. There is a soft tissue attenuating nodule within the peritoneal cavity which is indeterminate. Although this may represent postsurgical change peritoneal metastasis cannot be excluded and close interval follow-up is advised.    10/02/2016 Imaging    CT Abdomen/Pelvis with contrast 10/02/16 IMPRESSION: 1. Partial regression of previously suspected soft tissue nodule in the anterior aspect of the peritoneal cavity. Given the spontaneous regression since March 2018, in the absence of chemotherapy (last administered in December 2017), this is presumably a benign area of resolving postoperative scarring. No other suspicious findings are noted in the abdomen or pelvis on today's examination to suggest metastatic disease. 2. Borderline splenomegaly. 3. Probable mild hepatic steatosis. 4. Aortic atherosclerosis (mild). 5. Additional incidental findings, similar to prior examinations, as above.     03/08/2017 Procedure    COLONOSCOPY - Two 5 to 7 mm polyps in the transverse colon and in the cecum, removed with a cold snare. Resected and retrieved. - Patent end-to-end colo-colonic anastomosis, characterized by healthy appearing mucosa. - Otherwise normal appearing colon Impression: - Repeat colonoscopy in 2 years for surveillance.    03/08/2017 Pathology Results    Diagnosis (from colonoscopy)  Surgical [P], transverse and cecum, polyp (2) - TUBULAR ADENOMA (TWO FRAGMENTS). - NO HIGH GRADE DYSPLASIA OR MALIGNANCY.    05/11/2017 Imaging    CT CAP w contrast IMPRESSION: 1. No findings of metastatic disease. Further resolution of the previous omental nodularity, which was likely previously postinflammatory. 2. Borderline prominent  peripancreatic lymph node is chronically stable and likely benign/incidental. 3. Other imaging findings of potential clinical significance: Several tiny stable ground-glass  density nodules in the upper lobes, highly likely to be benign. Diffuse hepatic steatosis. Left mid kidney cyst. Aortic Atherosclerosis (ICD10-I70.0). Subserosal fibroid along the uterine fundus. Central disc protrusions in the lumbar spine. Small supraumbilical hernia.    08/30/2017 Mammogram    08/30/2017 Mammogram and breast US benign breast tissue.The benign pathology is concordant with prebiopsy imaging.    01/09/2018 Imaging    01/09/2018 CT CAP IMPRESSION: No evidence of recurrent or metastatic carcinoma within the chest, abdomen, or pelvis.  Stable hepatic steatosis.  Pedunculated fibroid in the uterine fundus.      CURRENT THERAPY:  Surveillance   INTERVAL HISTORY:  Taylor Whitney is here for a follow up of colon cancer. She was able to identify herself by birth date. She notes she is doing well and working from home. She denies any new changes, she denies pain or stomach issues. Her appetite and eating is doing well. She is abel to exercise at home given social distancing. She notes her BG has been elevated and feel she needs to lose some weight.    REVIEW OF SYSTEMS:   Constitutional: Denies fevers, chills or abnormal weight loss Eyes: Denies blurriness of vision Ears, nose, mouth, throat, and face: Denies mucositis or sore throat Respiratory: Denies cough, dyspnea or wheezes Cardiovascular: Denies palpitation, chest discomfort or lower extremity swelling Gastrointestinal:  Denies nausea, heartburn or change in bowel habits Skin: Denies abnormal skin rashes Lymphatics: Denies new lymphadenopathy or easy bruising Neurological:Denies numbness, tingling or new weaknesses Behavioral/Psych: Mood is stable, no new changes  All other systems were reviewed with the patient and are negative.  MEDICAL HISTORY:  Past Medical History:  Diagnosis Date  . Adenocarcinoma of descending colon ([place]) 12/2015   Flex sigmoidoscopy biopsy of descending colon mass is  adenocarcinoma/notes 01/18/2016  . Anemia   . Cancer [facility])    Hodgkin's lymphoma- Dr. Burr Medico  . Depression   . Family history of kidney cancer   . History of colostomy reversal   . Hodgkin lymphoma ([place]) dx'd 01/1995  . Large bowel obstruction s/p colectomy/colostomy Aug 2017 01/16/2016  . Migraine    "maybe once/month" (01/18/2016)  . Type II diabetes mellitus ([place]) 06/2015    SURGICAL HISTORY: Past Surgical History:  Procedure Laterality Date  . COLON RESECTION N/A 01/18/2016   Procedure: OPEN PARTIAL COLECTOMY WITH END COLOSTOMY;  Surgeon: Mickeal Skinner, MD;  Location: [facility];  Service: General;  Laterality: N/A;  . COLOSTOMY  01/18/2016  . COLOSTOMY TAKEDOWN N/A 05/11/2016   Procedure: LAPAROSCOPIC CONVERTED TO OPEN REVERSAL OF END COLOSTOMY;  Surgeon: Arta Bruce Kinsinger, MD;  Location: [facility];  Service: General;  Laterality: N/A;  . DILATION AND CURETTAGE OF UTERUS    . FLEXIBLE SIGMOIDOSCOPY N/A 01/17/2016   Procedure: FLEXIBLE SIGMOIDOSCOPY;  Surgeon: Ladene Artist, MD;  Location: [facility] ENDOSCOPY;  Service: Endoscopy;  Laterality: N/A;  . LYMPH NODE BIOPSY  1996  . MANDIBLE SURGERY  1988  . PARTIAL COLECTOMY  01/18/2016  . PROCTOSCOPY  05/11/2016   Procedure: PROCTOSCOPY;  Surgeon: Mickeal Skinner, MD;  Location: [facility];  Service: General;;  . [place]    I have reviewed the social history and family history with the patient and they are unchanged from previous note.  ALLERGIES:  is allergic to sulfa antibiotics.  MEDICATIONS:  Current Outpatient Medications  Medication Sig Dispense Refill  . ibuprofen (ADVIL,MOTRIN) 800 MG tablet Take 1 tablet (800 mg total) by mouth every 8 (eight) hours as needed. 30 tablet 0  . JANUVIA 100 MG tablet TK 1 T PO QD  1  . metFORMIN (GLUCOPHAGE-XR) 500 MG 24 hr tablet Take 500 mg by mouth 2 (two) times daily.  0  . venlafaxine XR (EFFEXOR-XR) 75 MG 24 hr capsule Take 225 mg by mouth at bedtime.  5   No  current facility-administered medications for this visit.     PHYSICAL EXAMINATION: ECOG PERFORMANCE STATUS: 0 - Asymptomatic  ** No vitals taken today, Exam not performed today **  LABORATORY DATA:  I have reviewed the data as listed CBC Latest Ref Rng & Units 05/13/2018 01/09/2018 09/18/2017  WBC 4.0 - 10.5 K/uL 10.1 11.0(H) 8.2  Hemoglobin 12.0 - 15.0 g/dL 13.8 13.5 13.9  Hematocrit 36.0 - 46.0 % 41.5 40.6 41.2  Platelets 150 - 400 K/uL 311 287 272     CMP Latest Ref Rng & Units 05/13/2018 01/09/2018 09/18/2017  Glucose 70 - 99 mg/dL 131(H) 200(H) 312(H)  BUN 6 - 20 mg/dL '10 9 8  ' Creatinine 0.44 - 1.00 mg/dL 0.81 0.79 0.95  Sodium 135 - 145 mmol/L 140 140 136  Potassium 3.5 - 5.1 mmol/L 3.8 3.6 3.7  Chloride 98 - 111 mmol/L 103 104 100  CO2 22 - 32 mmol/L '22 22 22  ' Calcium 8.9 - 10.3 mg/dL 9.8 9.5 10.0  Total Protein 6.5 - 8.1 g/dL 8.0 7.8 8.0  Total Bilirubin 0.3 - 1.2 mg/dL 0.7 0.7 0.7  Alkaline Phos 38 - 126 U/L 71 70 85  AST 15 - 41 U/L '18 19 22  ' ALT 0 - 44 U/L '20 23 27      ' RADIOGRAPHIC STUDIES: I have personally reviewed the radiological images as listed and agreed with the findings in the report. No results found.   ASSESSMENT & PLAN:  Taylor Whitney is a 48 y.o. female with    1. Cancer of left colon, invasive adenocarcinoma, well-differentiated, pT3N1aM0, stage IIIB, MSI-stable  -Diagnosed in 12/2015. Treated with surgery and adjuvant chemo CAPOX for 3 months. Currently on surveillance.  -She is clinically doing very well, asymptomatic. There is no clinical concern of disease recurrence so far. -She is almost 3 years since diagnosis. Her risk of recurrence is much lower. Continue colon cancer surveillance for total 5 years -plan to repeat surveillance CT scan in 12/2018, which will be her last scan.  -f/u in 4 months with scan  2. DM and overweight  -Currently on Metformin once daily. -I previously referred her to a diabetes and nutrition clinic.  -Again encouraged her to watch her diet, and exercise regularly, and try to lose weight. -She is willing to workout form home.   3. Hypertension  -She has not been formally diagnosed with hypertension, but blood pressure high today -she is currently not on BP medications. -I advised her to see her PCP for this and educated her about how DM and being overweight can contribute to risk of cardiovascular disease. I also advised her to maintain a healthy diet and exercise.   4. Cancer screening  -we discussed cancer screening .   -I reviewed and discussed her 08/2017 mammogram and breast US. She is overdue to 6 months recommended f/u. She will call to schedule   5. History of HL, stage III -She was diagnosed and treated in [place] in 1996, status post chemotherapy ABVD 9  months, bleomycin was held earlier due to pulmonary toxicities.  No radiation. -No evidence of recurrence.  Plan -She is clinically doing well, continue surveillance -f/u in 4 months with labs with CT CAP a few days before.      No problem-specific Assessment & Plan notes found for this encounter.   Orders Placed This Encounter  Procedures  . CT Abdomen Pelvis W Contrast    Standing Status:   Future    Standing Expiration Date:   09/13/2019    Order Specific Question:   If indicated for the ordered procedure, I authorize the administration of contrast media per Radiology protocol    Answer:   Yes    Order Specific Question:   Is patient pregnant?    Answer:   No    Order Specific Question:   Preferred imaging location?    Answer:   [facility]    Order Specific Question:   Is Oral Contrast requested for this exam?    Answer:   Yes, Per Radiology protocol    Order Specific Question:   Radiology Contrast Protocol - do NOT remove file path    Answer:   [email or web address]   I discussed the assessment and treatment plan with the patient. The patient was provided an opportunity  to ask questions and all were answered. The patient agreed with the plan and demonstrated an understanding of the instructions.  The patient was advised to call back or seek an in-person evaluation if the symptoms worsen or if the condition fails to improve as anticipated.   I provided 10 minutes of face-to-face video visit time during this encounter, and > 50% was spent counseling as documented under my assessment & plan.    Truitt Merle, MD 09/12/2018   I, Joslyn Devon, am acting as scribe for Truitt Merle, MD.   I have reviewed the above documentation for accuracy and completeness, and I agree with the above.

## 2018-09-12 ENCOUNTER — Inpatient Hospital Stay: Payer: BLUE CROSS/BLUE SHIELD | Attending: Hematology | Admitting: Hematology

## 2018-09-12 ENCOUNTER — Other Ambulatory Visit: Payer: BLUE CROSS/BLUE SHIELD

## 2018-09-12 DIAGNOSIS — E118 Type 2 diabetes mellitus with unspecified complications: Secondary | ICD-10-CM | POA: Diagnosis not present

## 2018-09-12 DIAGNOSIS — Z7984 Long term (current) use of oral hypoglycemic drugs: Secondary | ICD-10-CM | POA: Diagnosis not present

## 2018-09-12 DIAGNOSIS — Z79899 Other long term (current) drug therapy: Secondary | ICD-10-CM

## 2018-09-12 DIAGNOSIS — C186 Malignant neoplasm of descending colon: Secondary | ICD-10-CM | POA: Diagnosis not present

## 2018-09-12 DIAGNOSIS — I10 Essential (primary) hypertension: Secondary | ICD-10-CM

## 2018-09-13 ENCOUNTER — Telehealth: Payer: Self-pay | Admitting: Hematology

## 2018-09-13 ENCOUNTER — Encounter: Payer: Self-pay | Admitting: Hematology

## 2018-09-13 NOTE — Telephone Encounter (Signed)
No los per 4/16. °

## 2018-09-13 NOTE — Telephone Encounter (Deleted)
No los per 4/17. °

## 2018-11-14 DIAGNOSIS — R102 Pelvic and perineal pain: Secondary | ICD-10-CM | POA: Diagnosis not present

## 2018-11-14 DIAGNOSIS — D219 Benign neoplasm of connective and other soft tissue, unspecified: Secondary | ICD-10-CM | POA: Diagnosis not present

## 2019-01-03 ENCOUNTER — Telehealth: Payer: Self-pay | Admitting: Hematology

## 2019-01-03 NOTE — Telephone Encounter (Signed)
Scheduled appt per 8/07 sch message - unable to reach pt ;left message with appt date and time

## 2019-01-06 ENCOUNTER — Encounter: Payer: Self-pay | Admitting: Gastroenterology

## 2019-01-08 ENCOUNTER — Encounter (HOSPITAL_COMMUNITY): Payer: Self-pay

## 2019-01-08 ENCOUNTER — Other Ambulatory Visit: Payer: Self-pay

## 2019-01-08 ENCOUNTER — Ambulatory Visit (HOSPITAL_COMMUNITY)
Admission: RE | Admit: 2019-01-08 | Discharge: 2019-01-08 | Disposition: A | Discharge status: Home / Self Care | Payer: BC Managed Care – PPO | Source: Ambulatory Visit | Attending: Hematology | Admitting: Hematology

## 2019-01-08 ENCOUNTER — Telehealth: Payer: Self-pay | Admitting: Hematology

## 2019-01-08 ENCOUNTER — Inpatient Hospital Stay: Payer: BC Managed Care – PPO | Attending: Hematology

## 2019-01-08 DIAGNOSIS — K760 Fatty (change of) liver, not elsewhere classified: Secondary | ICD-10-CM | POA: Diagnosis not present

## 2019-01-08 DIAGNOSIS — Z8571 Personal history of Hodgkin lymphoma: Secondary | ICD-10-CM | POA: Insufficient documentation

## 2019-01-08 DIAGNOSIS — E119 Type 2 diabetes mellitus without complications: Secondary | ICD-10-CM | POA: Diagnosis not present

## 2019-01-08 DIAGNOSIS — N281 Cyst of kidney, acquired: Secondary | ICD-10-CM | POA: Diagnosis not present

## 2019-01-08 DIAGNOSIS — I10 Essential (primary) hypertension: Secondary | ICD-10-CM | POA: Insufficient documentation

## 2019-01-08 DIAGNOSIS — C189 Malignant neoplasm of colon, unspecified: Secondary | ICD-10-CM | POA: Diagnosis not present

## 2019-01-08 DIAGNOSIS — Z9221 Personal history of antineoplastic chemotherapy: Secondary | ICD-10-CM | POA: Diagnosis not present

## 2019-01-08 DIAGNOSIS — E663 Overweight: Secondary | ICD-10-CM | POA: Insufficient documentation

## 2019-01-08 DIAGNOSIS — D123 Benign neoplasm of transverse colon: Secondary | ICD-10-CM | POA: Insufficient documentation

## 2019-01-08 DIAGNOSIS — C186 Malignant neoplasm of descending colon: Secondary | ICD-10-CM | POA: Diagnosis not present

## 2019-01-08 DIAGNOSIS — Z79899 Other long term (current) drug therapy: Secondary | ICD-10-CM | POA: Diagnosis not present

## 2019-01-08 DIAGNOSIS — Z882 Allergy status to sulfonamides status: Secondary | ICD-10-CM | POA: Insufficient documentation

## 2019-01-08 DIAGNOSIS — D259 Leiomyoma of uterus, unspecified: Secondary | ICD-10-CM | POA: Insufficient documentation

## 2019-01-08 DIAGNOSIS — D120 Benign neoplasm of cecum: Secondary | ICD-10-CM | POA: Diagnosis not present

## 2019-01-08 DIAGNOSIS — I700 Atherosclerosis of aorta: Secondary | ICD-10-CM | POA: Diagnosis not present

## 2019-01-08 DIAGNOSIS — Z7984 Long term (current) use of oral hypoglycemic drugs: Secondary | ICD-10-CM | POA: Insufficient documentation

## 2019-01-08 DIAGNOSIS — E876 Hypokalemia: Secondary | ICD-10-CM | POA: Diagnosis not present

## 2019-01-08 DIAGNOSIS — Z9049 Acquired absence of other specified parts of digestive tract: Secondary | ICD-10-CM | POA: Diagnosis not present

## 2019-01-08 LAB — CBC WITH DIFFERENTIAL/PLATELET
Abs Immature Granulocytes: 0.04 10*3/uL (ref 0.00–0.07)
Basophils Absolute: 0 10*3/uL (ref 0.0–0.1)
Basophils Relative: 0 %
Eosinophils Absolute: 0.2 10*3/uL (ref 0.0–0.5)
Eosinophils Relative: 2 %
HCT: 41.8 % (ref 36.0–46.0)
Hemoglobin: 14.1 g/dL (ref 12.0–15.0)
Immature Granulocytes: 0 %
Lymphocytes Relative: 22 %
Lymphs Abs: 2.4 10*3/uL (ref 0.7–4.0)
MCH: 29.3 pg (ref 26.0–34.0)
MCHC: 33.7 g/dL (ref 30.0–36.0)
MCV: 86.7 fL (ref 80.0–100.0)
Monocytes Absolute: 0.6 10*3/uL (ref 0.1–1.0)
Monocytes Relative: 5 %
Neutro Abs: 7.6 10*3/uL (ref 1.7–7.7)
Neutrophils Relative %: 71 %
Platelets: 298 10*3/uL (ref 150–400)
RBC: 4.82 MIL/uL (ref 3.87–5.11)
RDW: 13.7 % (ref 11.5–15.5)
WBC: 10.8 10*3/uL — ABNORMAL HIGH (ref 4.0–10.5)
nRBC: 0 % (ref 0.0–0.2)

## 2019-01-08 LAB — COMPREHENSIVE METABOLIC PANEL
ALT: 25 U/L (ref 0–44)
AST: 35 U/L (ref 15–41)
Albumin: 4 g/dL (ref 3.5–5.0)
Alkaline Phosphatase: 57 U/L (ref 38–126)
Anion gap: 17 — ABNORMAL HIGH (ref 5–15)
BUN: 8 mg/dL (ref 6–20)
CO2: 22 mmol/L (ref 22–32)
Calcium: 9.4 mg/dL (ref 8.9–10.3)
Chloride: 101 mmol/L (ref 98–111)
Creatinine, Ser: 0.83 mg/dL (ref 0.44–1.00)
GFR calc Af Amer: >60 mL/min (ref >60)
GFR calc non Af Amer: >60 mL/min (ref >60)
Glucose, Bld: 204 mg/dL — ABNORMAL HIGH (ref 70–99)
Potassium: 3.3 mmol/L — ABNORMAL LOW (ref 3.5–5.1)
Sodium: 140 mmol/L (ref 135–145)
Total Bilirubin: 0.7 mg/dL (ref 0.3–1.2)
Total Protein: 7.9 g/dL (ref 6.5–8.1)

## 2019-01-08 LAB — CEA (IN HOUSE-CHCC): CEA (CHCC-In House): 1.58 ng/mL (ref 0.00–5.00)

## 2019-01-08 MED ORDER — IOHEXOL 300 MG/ML  SOLN
100.0000 mL | Freq: Once | INTRAMUSCULAR | Status: AC | PRN
  Administered 2019-01-08: 100 mL via INTRAVENOUS

## 2019-01-08 MED ORDER — SODIUM CHLORIDE (PF) 0.9 % IJ SOLN
INTRAMUSCULAR | Status: AC
  Filled 2019-01-08: qty 50

## 2019-01-08 NOTE — Telephone Encounter (Signed)
Scheduled appt per 8/12 schmessage - unable to reach pt - Left message with appt (doximity) date and time

## 2019-01-08 NOTE — Progress Notes (Signed)
[facility]   [phone] Fax:([phone]   Clinic Follow up Note   Patient Care Team: Donald Prose, MD as PCP - General (Family Medicine) Truitt Merle, MD as Consulting Physician (Hematology) Kinsinger, Arta Bruce, MD as Consulting Physician (General Surgery)   I connected with [name] on 01/09/2019 at  3:30 PM EDT by telephone visit and verified that I am speaking with the correct person using two identifiers.  I discussed the limitations, risks, security and privacy concerns of performing an evaluation and management service by telephone and the availability of in person appointments. I also discussed with the patient that there may be a patient responsible charge related to this service. The patient expressed understanding and agreed to proceed.  Patient's location:  Her home  Provider's location:  My Office   CHIEF COMPLAINT:  F/u on stage IIIB colon cancer  SUMMARY OF ONCOLOGIC HISTORY: Oncology History Overview Note  Cancer of left colon [facility])   Staging form: Colon and Rectum, AJCC 7th Edition   - Pathologic stage from 01/18/2016: Stage IIIB (T3, N1a, cM0) - Signed by Truitt Merle, MD on 02/01/2016    Cancer of left colon s/p colostomy takedown 05/11/2016  01/17/2016 Initial Diagnosis   Cancer of left colon ([place])   01/17/2016 Procedure   Flexible sigmoidoscopy showed malignant-appearing obstructing tumor in the descending colon, biopsied.   01/18/2016 Surgery   Patient underwent left hemicolectomy    01/18/2016 Pathology Results   Left hemicolectomy showed invasive adenocarcinoma, well differentiated, 4.1 cm, tumor extends into pericolonic soft tissue, lymphovascular invasion is identified, margins were negative, 1 out of 27 lymph nodes were positive.   02/24/2016 - 04/28/2016 Chemotherapy   Adjuvant CAPOX (Xeloda on day 1-14, oxaliplatin on day 1) every 3 weeks, for total of 4 cycles    08/07/2016 Imaging   CT A/P w contrast  IMPRESSION:  1. No complications identified status post partial colectomy. 2. There is a soft tissue attenuating nodule within the peritoneal cavity which is indeterminate. Although this may represent postsurgical change peritoneal metastasis cannot be excluded and close interval follow-up is advised.   10/02/2016 Imaging   CT Abdomen/Pelvis with contrast 10/02/16 IMPRESSION: 1. Partial regression of previously suspected soft tissue nodule in the anterior aspect of the peritoneal cavity. Given the spontaneous regression since March 2018, in the absence of chemotherapy (last administered in December 2017), this is presumably a benign area of resolving postoperative scarring. No other suspicious findings are noted in the abdomen or pelvis on today's examination to suggest metastatic disease. 2. Borderline splenomegaly. 3. Probable mild hepatic steatosis. 4. Aortic atherosclerosis (mild). 5. Additional incidental findings, similar to prior examinations, as above.    03/08/2017 Procedure   COLONOSCOPY - Two 5 to 7 mm polyps in the transverse colon and in the cecum, removed with a cold snare. Resected and retrieved. - Patent end-to-end colo-colonic anastomosis, characterized by healthy appearing mucosa. - Otherwise normal appearing colon Impression: - Repeat colonoscopy in 2 years for surveillance.   03/08/2017 Pathology Results   Diagnosis (from colonoscopy)  Surgical [P], transverse and cecum, polyp (2) - TUBULAR ADENOMA (TWO FRAGMENTS). - NO HIGH GRADE DYSPLASIA OR MALIGNANCY.   05/11/2017 Imaging   CT CAP w contrast IMPRESSION: 1. No findings of metastatic disease. Further resolution of the previous omental nodularity, which was likely previously postinflammatory. 2. Borderline prominent peripancreatic lymph node is chronically stable and likely benign/incidental. 3. Other imaging findings of potential clinical significance: Several tiny stable ground-glass density nodules  in the upper  lobes, highly likely to be benign. Diffuse hepatic steatosis. Left mid kidney cyst. Aortic Atherosclerosis (ICD10-I70.0). Subserosal fibroid along the uterine fundus. Central disc protrusions in the lumbar spine. Small supraumbilical hernia.   08/30/2017 Mammogram   08/30/2017 Mammogram and breast US benign breast tissue.The benign pathology is concordant with prebiopsy imaging.   01/09/2018 Imaging   01/09/2018 CT CAP IMPRESSION: No evidence of recurrent or metastatic carcinoma within the chest, abdomen, or pelvis.  Stable hepatic steatosis.  Pedunculated fibroid in the uterine fundus.   01/08/2019 Imaging   CT AP W Contrast  IMPRESSION: 1. Stable exam. No evidence of recurrent or metastatic carcinoma within the abdomen or pelvis. 2. Small uterine fibroids. 3. Hepatic steatosis.      CURRENT THERAPY:  Surveillance   INTERVAL HISTORY:  Taylor Whitney is here for a follow up of colon cancer. They identified themselves by birth date. She denies any new changes. She notes she will be returning to in person classes next weeks. She is overall doing well with no complaints.    REVIEW OF SYSTEMS:   Constitutional: Denies fevers, chills or abnormal weight loss Eyes: Denies blurriness of vision Ears, nose, mouth, throat, and face: Denies mucositis or sore throat Respiratory: Denies cough, dyspnea or wheezes Cardiovascular: Denies palpitation, chest discomfort or lower extremity swelling Gastrointestinal:  Denies nausea, heartburn or change in bowel habits Skin: Denies abnormal skin rashes Lymphatics: Denies new lymphadenopathy or easy bruising Neurological:Denies numbness, tingling or new weaknesses Behavioral/Psych: Mood is stable, no new changes  All other systems were reviewed with the patient and are negative.  MEDICAL HISTORY:  Past Medical History:  Diagnosis Date  . Adenocarcinoma of descending colon ([place]) 12/2015   Flex sigmoidoscopy biopsy of descending colon  mass is adenocarcinoma/notes 01/18/2016  . Anemia   . Depression   . Family history of kidney cancer   . History of colostomy reversal   . Hodgkin lymphoma ([place]) dx'd 01/1995   Dr. Burr Medico  . Large bowel obstruction s/p colectomy/colostomy Aug 2017 01/16/2016  . Migraine    "maybe once/month" (01/18/2016)  . Type II diabetes mellitus ([place]) 06/2015    SURGICAL HISTORY: Past Surgical History:  Procedure Laterality Date  . COLON RESECTION N/A 01/18/2016   Procedure: OPEN PARTIAL COLECTOMY WITH END COLOSTOMY;  Surgeon: Mickeal Skinner, MD;  Location: [facility];  Service: General;  Laterality: N/A;  . COLOSTOMY  01/18/2016  . COLOSTOMY TAKEDOWN N/A 05/11/2016   Procedure: LAPAROSCOPIC CONVERTED TO OPEN REVERSAL OF END COLOSTOMY;  Surgeon: Arta Bruce Kinsinger, MD;  Location: [facility];  Service: General;  Laterality: N/A;  . DILATION AND CURETTAGE OF UTERUS    . FLEXIBLE SIGMOIDOSCOPY N/A 01/17/2016   Procedure: FLEXIBLE SIGMOIDOSCOPY;  Surgeon: Ladene Artist, MD;  Location: [facility] ENDOSCOPY;  Service: Endoscopy;  Laterality: N/A;  . LYMPH NODE BIOPSY  1996  . MANDIBLE SURGERY  1988  . PARTIAL COLECTOMY  01/18/2016  . PROCTOSCOPY  05/11/2016   Procedure: PROCTOSCOPY;  Surgeon: Mickeal Skinner, MD;  Location: [facility];  Service: General;;  . [place]    I have reviewed the social history and family history with the patient and they are unchanged from previous note.  ALLERGIES:  is allergic to sulfa antibiotics.  MEDICATIONS:  Current Outpatient Medications  Medication Sig Dispense Refill  . ibuprofen (ADVIL,MOTRIN) 800 MG tablet Take 1 tablet (800 mg total) by mouth every 8 (eight) hours as needed. 30 tablet 0  .  JANUVIA 100 MG tablet TK 1 T PO QD  1  . metFORMIN (GLUCOPHAGE-XR) 500 MG 24 hr tablet Take 500 mg by mouth 2 (two) times daily.  0  . potassium chloride SA (K-DUR) 20 MEQ tablet Take 1 tablet (20 mEq total) by mouth 2 (two) times daily. 7 tablet 0  .  venlafaxine XR (EFFEXOR-XR) 75 MG 24 hr capsule Take 225 mg by mouth at bedtime.  5   No current facility-administered medications for this visit.     PHYSICAL EXAMINATION: ECOG PERFORMANCE STATUS: 0 - Asymptomatic  No vitals taken today, Exam not performed today   LABORATORY DATA:  I have reviewed the data as listed CBC Latest Ref Rng & Units 01/08/2019 05/13/2018 01/09/2018  WBC 4.0 - 10.5 K/uL 10.8(H) 10.1 11.0(H)  Hemoglobin 12.0 - 15.0 g/dL 14.1 13.8 13.5  Hematocrit 36.0 - 46.0 % 41.8 41.5 40.6  Platelets 150 - 400 K/uL 298 311 287     CMP Latest Ref Rng & Units 01/08/2019 05/13/2018 01/09/2018  Glucose 70 - 99 mg/dL 204(H) 131(H) 200(H)  BUN 6 - 20 mg/dL _0 Creatinine 0.44 - 1.00 mg/dL 0.83 0.81 0.79  Sodium 135 - 145 mmol/L 140 140 140  Potassium 3.5 - 5.1 mmol/L 3.3(L) 3.8 3.6  Chloride 98 - 111 mmol/L 101 103 104  CO2 22 - 32 mmol/L _1 Calcium 8.9 - 10.3 mg/dL 9.4 9.8 9.5  Total Protein 6.5 - 8.1 g/dL 7.9 8.0 7.8  Total Bilirubin 0.3 - 1.2 mg/dL 0.7 0.7 0.7  Alkaline Phos 38 - 126 U/L 57 71 70  AST 15 - 41 U/L 35 18 19  ALT 0 - 44 U/L _2 RADIOGRAPHIC STUDIES: I have personally reviewed the radiological images as listed and agreed with the findings in the report. Ct Abdomen Pelvis W Contrast  Result Date: 01/08/2019 CLINICAL DATA:  Follow-up colon carcinoma. Previous surgery in chemotherapy. EXAM: CT ABDOMEN AND PELVIS WITH CONTRAST TECHNIQUE: Multidetector CT imaging of the abdomen and pelvis was performed using the standard protocol following bolus administration of intravenous contrast. CONTRAST:  187m OMNIPAQUE IOHEXOL 300 MG/ML  SOLN COMPARISON:  01/09/2018 FINDINGS: Lower Chest: No acute findings. Hepatobiliary: No hepatic masses identified. Stable diffuse hepatic steatosis. Gallbladder is unremarkable. No evidence of biliary ductal dilatation. Pancreas:  No mass or inflammatory changes. Spleen: Within normal limits in size and appearance.  Adrenals/Urinary Tract: No masses identified. Stable small left renal cyst. No evidence of hydronephrosis. Stomach/Bowel: Stable postop changes from previous left colectomy. No evidence of bowel wall thickening, inflammatory process, or abnormal fluid collections. Vascular/Lymphatic: No pathologically enlarged lymph nodes. No abdominal aortic aneurysm. Reproductive: Several small uterine fibroids are again seen, largest on the right measuring 4.5 cm. No adnexal mass or free fluid. Other: Stable tiny midline epigastric ventral hernia which contains only fat. Musculoskeletal:  No suspicious bone lesions identified. IMPRESSION: 1. Stable exam. No evidence of recurrent or metastatic carcinoma within the abdomen or pelvis. 2. Small uterine fibroids. 3. Hepatic steatosis. Electronically Signed   By: JMarlaine HindM.D.   On: 01/08/2019 12:47     ASSESSMENT & PLAN:  Taylor WOLFGANGis a 48y.o. female with   1. Cancer of left colon, invasive adenocarcinoma, well-differentiated, pT3N1aM0, stage IIIB, MSI-stable -Diagnosed in 12/2015. Treated with surgery andadjuvantchemoCAPOX for 3 months. Currently on surveillance. -I personally reviewed and discussed her CT AP from 01/08/19 which shows no evidence of disease. There is  incidental finding of fatty liver. I recommend she control and lose some weight to prevent further impact on her liver.  -From a cancer standpoint she is clonally doing well. Labs reviewed, CBC and CMP WNL except WBC 10.8, K 3.3, BG 204, Anion gap 17. CEA. I will call in 1 week dose oral potassium.  -She is 3 years since diagnosis. Her risk of recurrence has decreased significantly. I do not plan to repeat surveillance scan unless she develops concerning symptoms. Continue colon cancer surveillance for total 5 years -f/u in 6 months   2. DM and overweight -Currently on Metformin once daily. -I previously referred her to a diabetes and nutrition clinic. -Again encouraged her to watch  her diet, and exercise regularly, and try to lose weight. -She is willing to workout form home.  -She has fatty liver on latest scan I again reviewed weight management with her.   3.Hypertension -She has not been formally diagnosed with hypertension, but blood pressure high today -she is currently not on BP medications. -I advised her to see her PCP forthis and educated her about how DM and being overweight can contribute torisk ofcardiovascular disease. I also advised her to maintain a healthy diet and exercise.  4. Cancer screening  -we discussed cancer screening. -I reviewed and discussed her 08/2017 mammogram and breast US. She is overdue to 6 months recommended f/u.She will call to schedule   5. History of HL, stage III -She was diagnosed and treated in [place] in 1996, status post chemotherapy ABVD 9 months,bleomycin was held earlier due to pulmonary toxicities. No radiation. -No evidence of recurrence.  6. Hypokalemia  -K at 3.3. yesterday (01/08/19) -I will call in 20 mEq oral potassium to take BID daily for a week. Then proceed to increase potassium in her diet while managing her DM.  -She can repeat potassium level with her PCP  Plan -lab and CT AP reviewed, NED  -I prescribed oral potassium today  -Lab and f/u in 6 months    No problem-specific Assessment & Plan notes found for this encounter.   No orders of the defined types were placed in this encounter.  I discussed the assessment and treatment plan with the patient. The patient was provided an opportunity to ask questions and all were answered. The patient agreed with the plan and demonstrated an understanding of the instructions.  The patient was advised to call back or seek an in-person evaluation if the symptoms worsen or if the condition fails to improve as anticipated.  I provided 12 minutes of non face-to-face telephone visit time during this encounter, and > 50% was spent counseling as  documented under my assessment & plan.    Truitt Merle, MD 01/09/2019   I, Joslyn Devon, am acting as scribe for Truitt Merle, MD.   I have reviewed the above documentation for accuracy and completeness, and I agree with the above.

## 2019-01-09 ENCOUNTER — Encounter: Payer: Self-pay | Admitting: Hematology

## 2019-01-09 ENCOUNTER — Telehealth: Payer: Self-pay | Admitting: Hematology

## 2019-01-09 ENCOUNTER — Inpatient Hospital Stay (HOSPITAL_BASED_OUTPATIENT_CLINIC_OR_DEPARTMENT_OTHER): Payer: BC Managed Care – PPO | Admitting: Hematology

## 2019-01-09 DIAGNOSIS — C186 Malignant neoplasm of descending colon: Secondary | ICD-10-CM | POA: Diagnosis not present

## 2019-01-09 MED ORDER — POTASSIUM CHLORIDE CRYS ER 20 MEQ PO TBCR: 20.0000 meq | EXTENDED_RELEASE_TABLET | Freq: Two times a day (BID) | ORAL | 0 refills | Status: DC

## 2019-01-09 NOTE — Telephone Encounter (Signed)
Scheduled appt per 8/13 los.  Left a voice message of appt date and time.

## 2019-02-12 ENCOUNTER — Encounter: Payer: Self-pay | Admitting: Gastroenterology

## 2019-02-26 DIAGNOSIS — E119 Type 2 diabetes mellitus without complications: Secondary | ICD-10-CM | POA: Diagnosis not present

## 2019-03-04 ENCOUNTER — Ambulatory Visit (HOSPITAL_COMMUNITY)
Admission: EM | Admit: 2019-03-04 | Discharge: 2019-03-04 | Disposition: A | Discharge status: Home / Self Care | Payer: BC Managed Care – PPO | Attending: Family Medicine | Admitting: Family Medicine

## 2019-03-04 ENCOUNTER — Encounter (HOSPITAL_COMMUNITY): Payer: Self-pay

## 2019-03-04 DIAGNOSIS — L255 Unspecified contact dermatitis due to plants, except food: Secondary | ICD-10-CM | POA: Diagnosis not present

## 2019-03-04 DIAGNOSIS — R030 Elevated blood-pressure reading, without diagnosis of hypertension: Secondary | ICD-10-CM

## 2019-03-04 MED ORDER — PREDNISONE 10 MG (21) PO TBPK: ORAL_TABLET | Freq: Every day | ORAL | 0 refills | Status: DC

## 2019-03-04 NOTE — ED Provider Notes (Signed)
[facility]   [id number] 03/04/19 Arrival Time: [id number]  ASSESSMENT & PLAN:  1. Rhus dermatitis   2. Elevated blood pressure reading without diagnosis of hypertension     No signs of cellulitis or abscess formation.  May use Benadryl if needed. Meds ordered this encounter  Medications  . predniSONE (STERAPRED UNI-PAK 21 TAB) 10 MG (21) TBPK tablet    Sig: Take by mouth daily. Take as directed.    Dispense:  21 tablet    Refill:  0    Recommend: Follow-up Information    Schedule an appointment as soon as possible for a visit  with Donald Prose, MD.   Specialty: Family Medicine Why: To discuss your blood pressure. Contact information: [address] [place] [address] [phone]           Reviewed expectations re: course of current medical issues. Questions answered. Outlined signs and symptoms indicating need for more acute intervention. Patient verbalized understanding. After Visit Summary given.   SUBJECTIVE:  Taylor Whitney is a 48 y.o. female who presents with a skin complaint.   Location: L inner ankle Onset: gradual Duration: noticed "blistering and itching" over the past 4-5 days; a small scratch at this area 2-3 weeks ago Associated pruritis? significant Associated pain? none Progression: increasing steadily  Drainage? minimal and watery Known trigger? No  New soaps/lotions/topicals/detergents? No  Environmental exposures? No  Contacts with similar? No  Recent travel? No  Other associated symptoms: none Therapies tried thus far: OTC Hydrocortisone cream without much relief. Arthralgia or myalgia? none Recent illness? none Fever? none New medications? none No specific aggravating or alleviating factors reported.  Increased blood pressure noted today. Reports that she has not been treated for hypertension in the past. Has been told BP high in the past.  She reports no chest pain on exertion, no dyspnea on  exertion, no swelling of ankles, no orthostatic dizziness or lightheadedness, no orthopnea or paroxysmal nocturnal dyspnea, no palpitations and no intermittent claudication symptoms.  ROS: As per HPI. All other systems negative.   OBJECTIVE: Vitals:   03/04/19 0956  BP: (!) 161/99  Pulse: 89  Resp: 16  Temp: 98.1 F (36.7 C)  TempSrc: Oral  SpO2: 99%    General appearance: alert; no distress HEENT: ; AT Neck: supple with FROM Lungs: clear to auscultation bilaterally CV: RRR Abd: soft Extremities: no edema; moves all extremities normally Skin: warm and dry; signs of infection: no; areas of linear papules and vesicles with mild surrounding erythema over L inner ankle; slight watery drainage from open areas Psychological: alert and cooperative; normal mood and affect  Allergies  Allergen Reactions  . Sulfa Antibiotics Rash    Past Medical History:  Diagnosis Date  . Adenocarcinoma of descending colon ([place]) 12/2015   Flex sigmoidoscopy biopsy of descending colon mass is adenocarcinoma/notes 01/18/2016  . Anemia   . Depression   . Family history of kidney cancer   . History of colostomy reversal   . Hodgkin lymphoma ([place]) dx'd 01/1995   Dr. Burr Medico  . Large bowel obstruction s/p colectomy/colostomy Aug 2017 01/16/2016  . Migraine    "maybe once/month" (01/18/2016)  . Type II diabetes mellitus ([place]) 06/2015   Social History   Socioeconomic History  . Marital status: Single    Spouse name: Not on file  . Number of children: 0  . Years of education: Not on file  . Highest education level: Not on file  Occupational History  .  Occupation: Professor    Comment: [employer]  . Financial resource strain: Not on file  . Food insecurity    Worry: Not on file    Inability: Not on file  . Transportation needs    Medical: Not on file    Non-medical: Not on file  Tobacco Use  . Smoking status: Never Smoker  . Smokeless tobacco: Never Used   Substance and Sexual Activity  . Alcohol use: Yes    Comment: rare social; once socially  . Drug use: No  . Sexual activity: Not Currently  Lifestyle  . Physical activity    Days per week: Not on file    Minutes per session: Not on file  . Stress: Not on file  Relationships  . Social Herbalist on phone: Not on file    Gets together: Not on file    Attends religious service: Not on file    Active member of club or organization: Not on file    Attends meetings of clubs or organizations: Not on file    Relationship status: Not on file  . Intimate partner violence    Fear of current or ex partner: Not on file    Emotionally abused: Not on file    Physically abused: Not on file    Forced sexual activity: Not on file  Other Topics Concern  . 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 Diabetes Mother   . Cancer Father        prostate cancer  . Cancer Paternal Uncle        renal cancer ; smoker  . Diabetes Maternal Grandmother   . Diabetes Maternal Grandfather   . Clotting disorder Paternal Grandmother   . COPD Paternal Grandfather   . Colon cancer Neg Hx    Past Surgical History:  Procedure Laterality Date  . COLON RESECTION N/A 01/18/2016   Procedure: OPEN PARTIAL COLECTOMY WITH END COLOSTOMY;  Surgeon: Mickeal Skinner, MD;  Location: [facility];  Service: General;  Laterality: N/A;  . COLOSTOMY  01/18/2016  . COLOSTOMY TAKEDOWN N/A 05/11/2016   Procedure: LAPAROSCOPIC CONVERTED TO OPEN REVERSAL OF END COLOSTOMY;  Surgeon: Arta Bruce Kinsinger, MD;  Location[facility];  Service: General;  Laterality: N/A;  . DILATION AND CURETTAGE OF UTERUS    . FLEXIBLE SIGMOIDOSCOPY N/A 01/17/2016   Procedure: FLEXIBLE SIGMOIDOSCOPY;  Surgeon: Ladene Artist, MD;  [facility];  Service: Endoscopy;  Laterality:  N/A;  . LYMPH NODE BIOPSY  1996  . MANDIBLE SURGERY  1988  . PARTIAL COLECTOMY  01/18/2016  . PROCTOSCOPY  05/11/2016   Procedure: PROCTOSCOPY;  Surgeon: Mickeal Skinner, MD;  Location: [facility];  Service: General;;  . [place]     Vanessa Kick, MD 03/04/19 1053

## 2019-03-04 NOTE — ED Triage Notes (Signed)
Pt report she started having a rash in her left ankle 3 weeks ago, and then she develop a blister 3 days ago. Pt is using hydrocortisone cream.

## 2019-03-11 DIAGNOSIS — Z23 Encounter for immunization: Secondary | ICD-10-CM | POA: Diagnosis not present

## 2019-03-11 DIAGNOSIS — E119 Type 2 diabetes mellitus without complications: Secondary | ICD-10-CM | POA: Diagnosis not present

## 2019-04-14 DIAGNOSIS — Z20828 Contact with and (suspected) exposure to other viral communicable diseases: Secondary | ICD-10-CM | POA: Diagnosis not present

## 2019-04-18 IMAGING — CT CT CHEST W/ CM
2 of 5 series · 13 of 36 positions shown, 16 images · IV contrast (ISOVUE 300)
Comparison: Multiple exams, including 10/02/2016 and 02/10/2016

CLINICAL DATA: Restaging of descending colon cancer, original tumor
had lymphovascular invasion and [REDACTED] lymph nodes was positive,
chemotherapy in 5450. Remote history of Hodgkin's lymphoma.
Colostomy takedown 05/11/2016.

EXAM:
CT CHEST, ABDOMEN, AND PELVIS WITH CONTRAST
TECHNIQUE: Multidetector CT imaging of the chest, abdomen and pelvis was
performed following the standard protocol during bolus
administration of intravenous contrast.
CONTRAST:  100mL 9916 IOPAMIDOL ([id number]-FTT) INJECTION 61%

[Series 2: cap with · axial · 0.78mm/px · z∈[-467,+68]mm · 10 of 131 slices shown, 13 images]
[im 12/131  mediastinal]
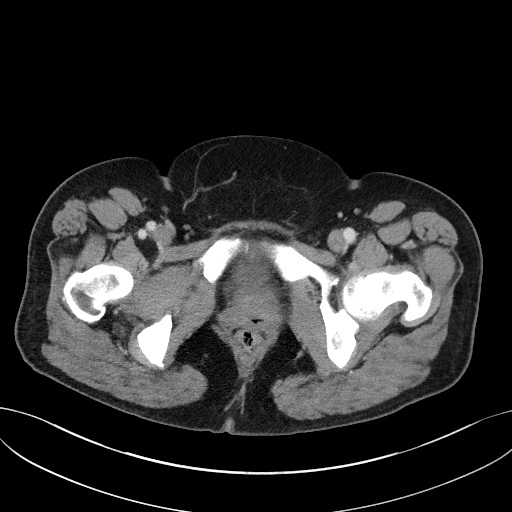
[im 12/131  lung]
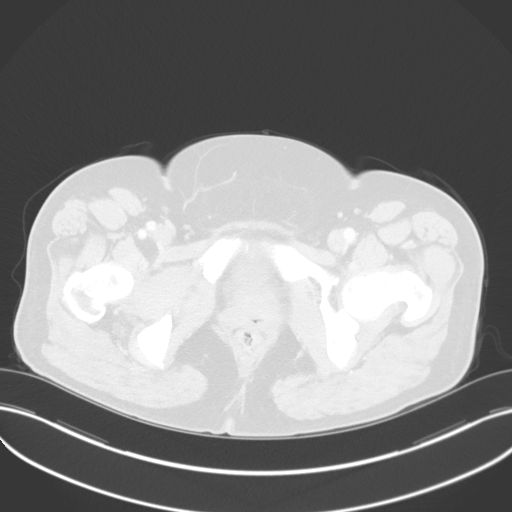
[im 24/131  lung]
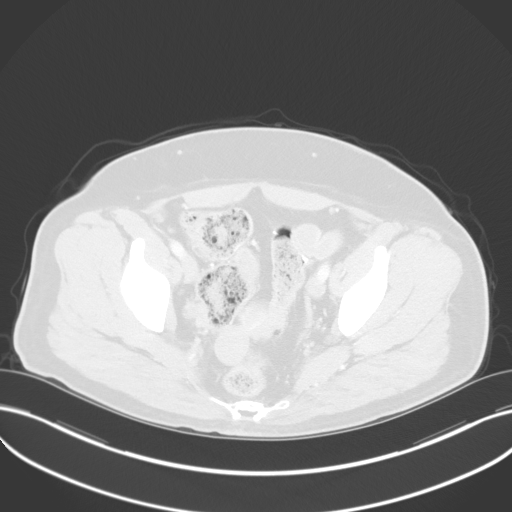
[im 36/131  lung]
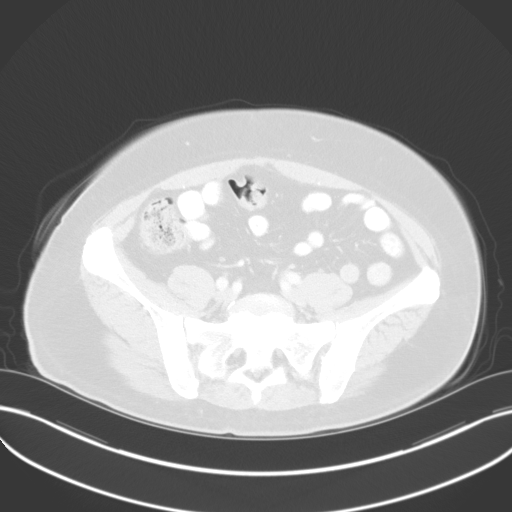
[im 48/131  lung]
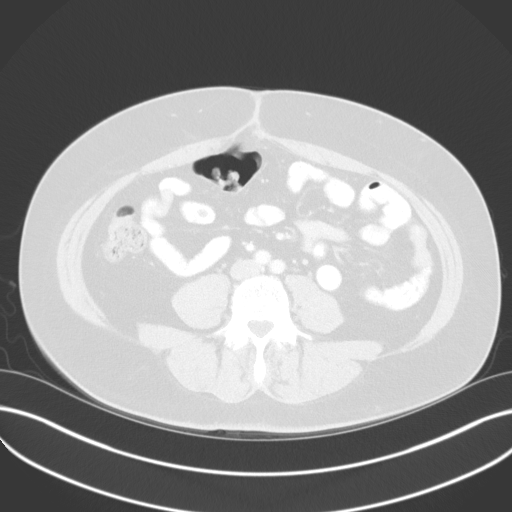
[im 60/131  mediastinal]
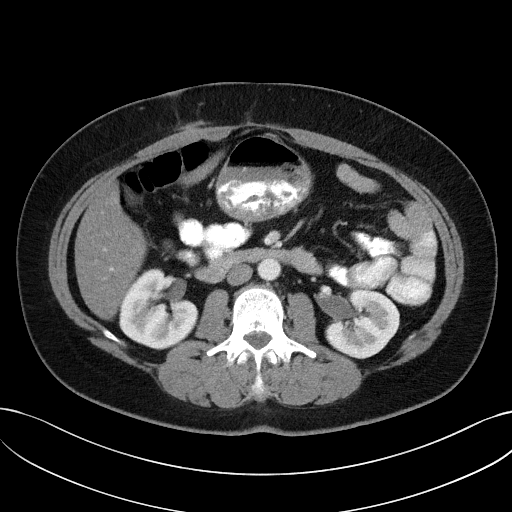
[im 60/131  lung]
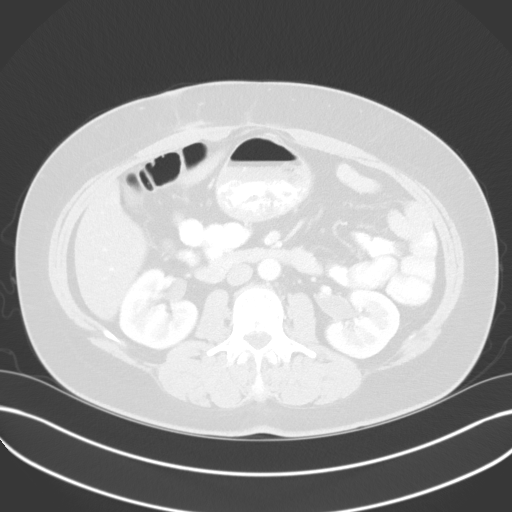
[im 71/131  lung]
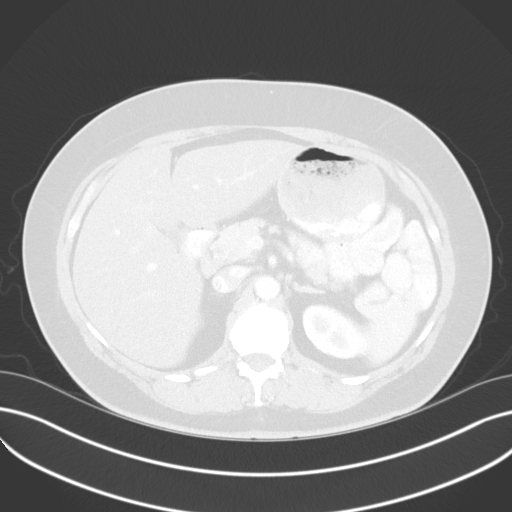
[im 83/131  lung]
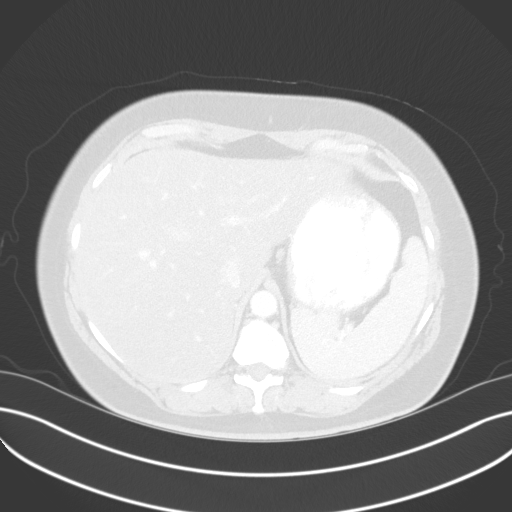
[im 95/131  lung]
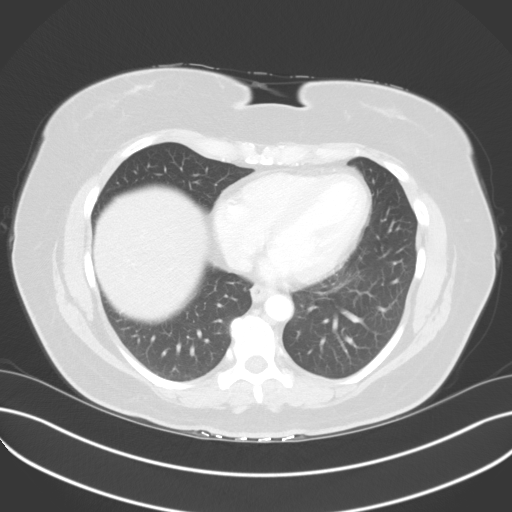
[im 107/131  mediastinal]
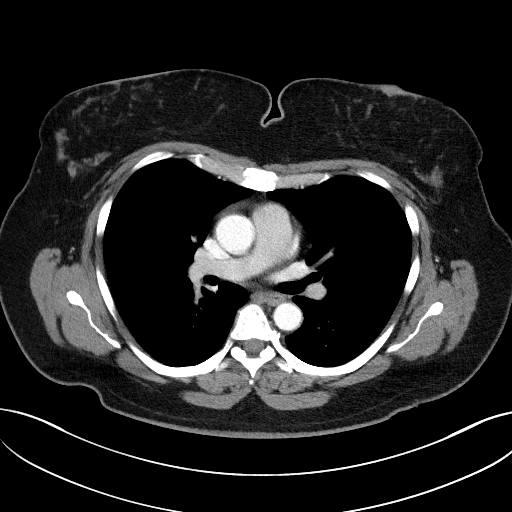
[im 107/131  lung]
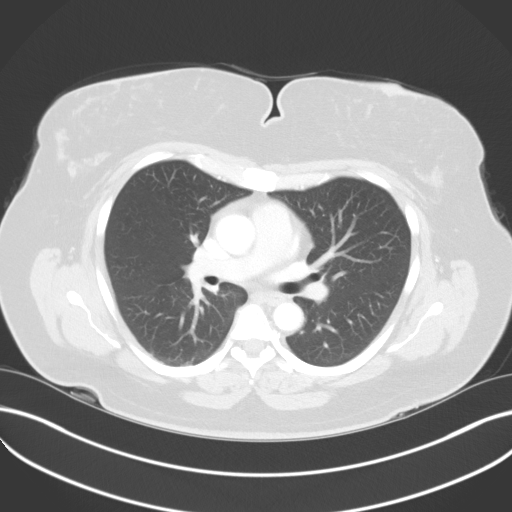
[im 119/131  lung]
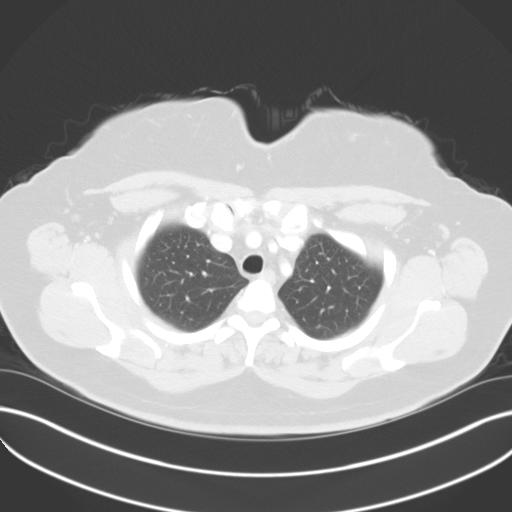

[Series 5: coronals · coronal · 0.81mm/px · 3 of 162 slices shown]
[im 33/162  lung]
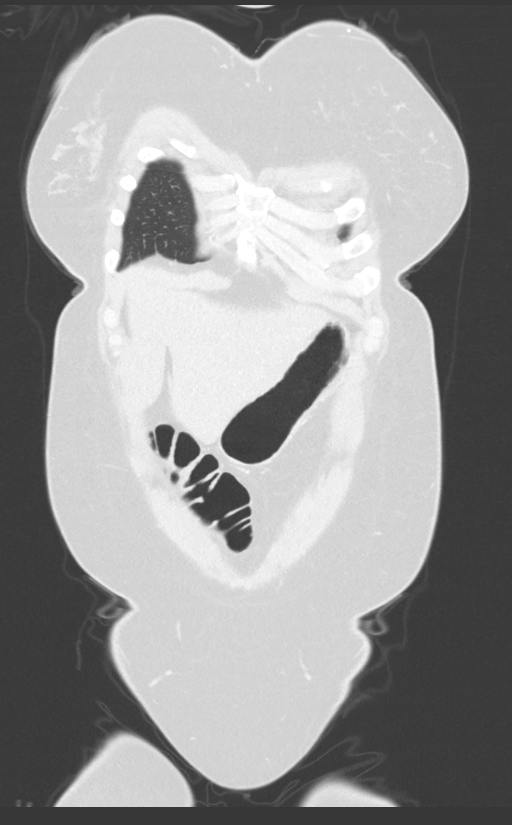
[im 65/162  lung]
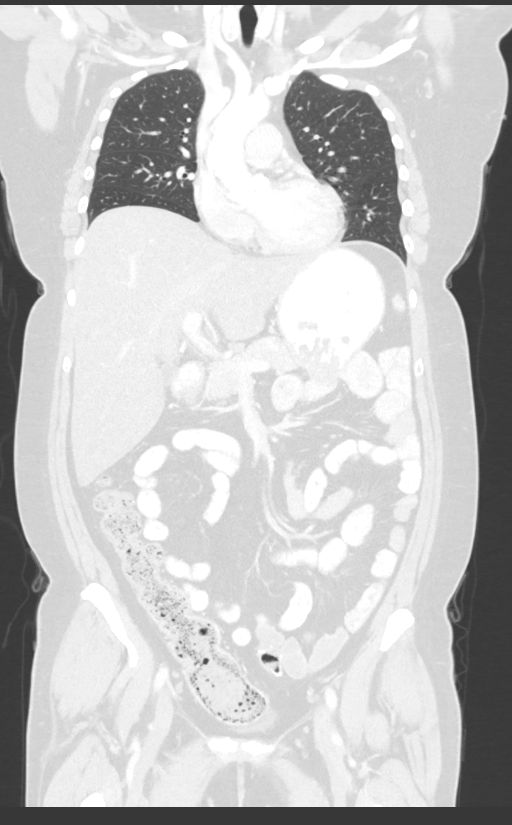
[im 97/162  lung]
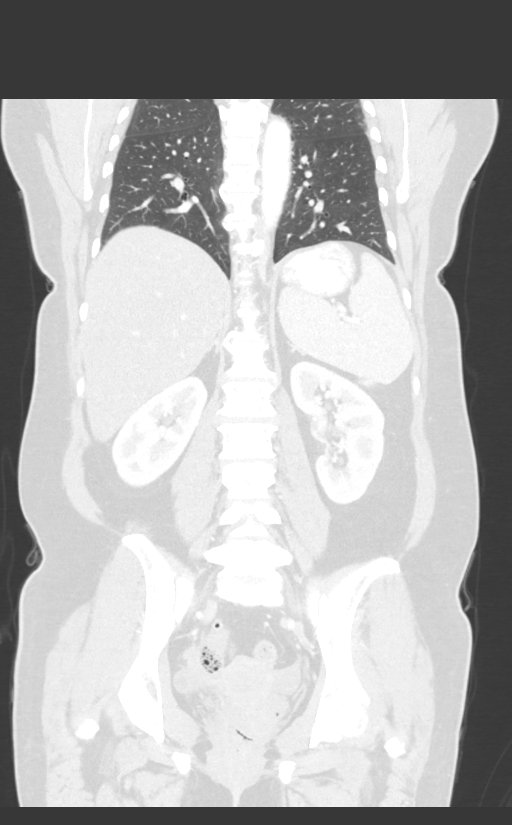

[13 of 36 positions shown; findings below may reference images not displayed]

FINDINGS: CT CHEST FINDINGS

Cardiovascular: Unremarkable

Mediastinum/Nodes: Unremarkable

Lungs/Pleura: Stable single 3 mm ground-glass density nodules in the
apical segment right upper lobe and posterolaterally in the left
upper lobe on image 42/4, highly likely to be benign. No findings of
metastatic disease to the lungs.

Musculoskeletal: Thoracic spondylosis.

CT ABDOMEN PELVIS FINDINGS

Hepatobiliary: Diffuse hepatic steatosis. Mild fatty sparing along
the gallbladder fossa. No appreciable metastatic disease to the
liver.

Pancreas: Unremarkable

Spleen: Unremarkable

Adrenals/Urinary Tract: 1.0 cm hypodense lesion in the left mid
kidney is stable and likely a cyst. Adrenal glands normal.

Stomach/Bowel: Colonic anastomotic staple line appears normal.
Overall unremarkable.

Vascular/Lymphatic: Aortoiliac atherosclerotic vascular disease.
Right gastric lymph node 0.7 cm in short axis on image 46/2, stable.
Peripancreatic node 1.1 cm in short axis on image 57/2, stable by my
measurements.

Reproductive: Suspected subserosal fibroid along the uterine fundus,
4.9 by 3.1 cm in its current orientation, the uterus is currently
retroverted and was previously and departed. Otherwise unremarkable.

Other: Previous omental nodularity in the vicinity of the prior
laparoscopy site has further regressed and is no longer readily
apparent.

Musculoskeletal: Central disc protrusions at L2- 3, L3-4, and L4-5
with calcified margins and probable associated central narrowing of
the thecal sac. Small supraumbilical hernia on image 105/6 contains
adipose tissue.
IMPRESSION: 1. No findings of metastatic disease. Further resolution of the
previous omental nodularity, which was likely previously
postinflammatory.
2. Borderline prominent peripancreatic lymph node is chronically
stable and likely benign/incidental.
3. Other imaging findings of potential clinical significance:
Several tiny stable ground-glass density nodules in the upper lobes,
highly likely to be benign. Diffuse hepatic steatosis. Left mid
kidney cyst. Aortic Atherosclerosis ([id number]-FTT-88X2T). Subserosal
fibroid along the uterine fundus. Central disc protrusions in the
lumbar spine. Small supraumbilical hernia.

## 2019-05-07 DIAGNOSIS — E1165 Type 2 diabetes mellitus with hyperglycemia: Secondary | ICD-10-CM | POA: Diagnosis not present

## 2019-05-07 DIAGNOSIS — J209 Acute bronchitis, unspecified: Secondary | ICD-10-CM | POA: Diagnosis not present

## 2019-07-09 ENCOUNTER — Telehealth: Payer: Self-pay | Admitting: Hematology

## 2019-07-09 NOTE — Telephone Encounter (Signed)
Returned patient's phone call regarding rescheduling an appointment, left a voicemail. 

## 2019-07-11 ENCOUNTER — Inpatient Hospital Stay: Payer: BC Managed Care – PPO | Attending: Hematology

## 2019-07-11 ENCOUNTER — Inpatient Hospital Stay: Payer: BC Managed Care – PPO | Admitting: Hematology

## 2019-08-07 ENCOUNTER — Ambulatory Visit: Payer: BC Managed Care – PPO | Attending: Internal Medicine

## 2019-08-07 DIAGNOSIS — Z23 Encounter for immunization: Secondary | ICD-10-CM

## 2019-08-07 NOTE — Progress Notes (Signed)
   Covid-19 Vaccination Clinic  Name:  Taylor Whitney    MRN: [id number] DOB: 1970/07/22  08/07/2019  Taylor Whitney was observed post Covid-19 immunization for 15 minutes without incident. She was provided with Vaccine Information Sheet and instruction to access the V-Safe system.   Taylor Whitney was instructed to call 911 with any severe reactions post vaccine: Marland Kitchen Difficulty breathing  . Swelling of face and throat  . A fast heartbeat  . A bad rash all over body  . Dizziness and weakness   Immunizations Administered    Name Date Dose VIS Date Route   Pfizer COVID-19 Vaccine 08/07/2019  8:40 AM 0.3 mL 05/09/2019 Intramuscular   Manufacturer: [place]   Lot: [id number]   [address]: [id number]

## 2019-09-01 ENCOUNTER — Ambulatory Visit: Payer: BC Managed Care – PPO | Attending: Internal Medicine

## 2019-09-01 DIAGNOSIS — Z23 Encounter for immunization: Secondary | ICD-10-CM

## 2019-09-01 NOTE — Progress Notes (Signed)
   Covid-19 Vaccination Clinic  Name:  Taylor Whitney    MRN: [id number] DOB: [birth year]  09/01/2019  Taylor Whitney was observed post Covid-19 immunization for 15 minutes without incident. She was provided with Vaccine Information Sheet and instruction to access the V-Safe system.   Taylor Whitney was instructed to call 911 with any severe reactions post vaccine: Marland Kitchen Difficulty breathing  . Swelling of face and throat  . A fast heartbeat  . A bad rash all over body  . Dizziness and weakness   Immunizations Administered    Name Date Dose VIS Date Route   Pfizer COVID-19 Vaccine 09/01/2019  8:59 AM 0.3 mL 05/09/2019 Intramuscular   Manufacturer: [employer]   Lot: [id number]   [place]: [id number]

## 2019-11-28 DIAGNOSIS — Z23 Encounter for immunization: Secondary | ICD-10-CM | POA: Diagnosis not present

## 2019-11-28 DIAGNOSIS — F325 Major depressive disorder, single episode, in full remission: Secondary | ICD-10-CM | POA: Diagnosis not present

## 2019-11-28 DIAGNOSIS — I10 Essential (primary) hypertension: Secondary | ICD-10-CM | POA: Diagnosis not present

## 2019-11-28 DIAGNOSIS — R2230 Localized swelling, mass and lump, unspecified upper limb: Secondary | ICD-10-CM | POA: Diagnosis not present

## 2019-11-28 DIAGNOSIS — E1165 Type 2 diabetes mellitus with hyperglycemia: Secondary | ICD-10-CM | POA: Diagnosis not present

## 2019-12-02 ENCOUNTER — Other Ambulatory Visit: Payer: Self-pay | Admitting: Family Medicine

## 2019-12-02 DIAGNOSIS — R2230 Localized swelling, mass and lump, unspecified upper limb: Secondary | ICD-10-CM

## 2019-12-22 ENCOUNTER — Other Ambulatory Visit: Payer: BC Managed Care – PPO

## 2019-12-30 DIAGNOSIS — E1165 Type 2 diabetes mellitus with hyperglycemia: Secondary | ICD-10-CM | POA: Diagnosis not present

## 2019-12-30 DIAGNOSIS — I10 Essential (primary) hypertension: Secondary | ICD-10-CM | POA: Diagnosis not present

## 2020-01-06 ENCOUNTER — Ambulatory Visit
Admission: RE | Admit: 2020-01-06 | Discharge: 2020-01-06 | Disposition: A | Discharge status: Home / Self Care | Payer: BC Managed Care – PPO | Source: Ambulatory Visit | Attending: Family Medicine | Admitting: Family Medicine

## 2020-01-06 ENCOUNTER — Other Ambulatory Visit: Payer: Self-pay

## 2020-01-06 DIAGNOSIS — R2230 Localized swelling, mass and lump, unspecified upper limb: Secondary | ICD-10-CM

## 2020-01-06 DIAGNOSIS — N6324 Unspecified lump in the left breast, lower inner quadrant: Secondary | ICD-10-CM | POA: Diagnosis not present

## 2020-01-06 DIAGNOSIS — R928 Other abnormal and inconclusive findings on diagnostic imaging of breast: Secondary | ICD-10-CM | POA: Diagnosis not present

## 2020-01-06 DIAGNOSIS — N6322 Unspecified lump in the left breast, upper inner quadrant: Secondary | ICD-10-CM | POA: Diagnosis not present

## 2020-02-12 ENCOUNTER — Encounter: Payer: Self-pay | Admitting: Gastroenterology

## 2020-03-17 ENCOUNTER — Other Ambulatory Visit (HOSPITAL_COMMUNITY)
Admission: RE | Admit: 2020-03-17 | Discharge: 2020-03-17 | Disposition: A | Discharge status: Home / Self Care | Payer: BC Managed Care – PPO | Source: Ambulatory Visit | Attending: Family Medicine | Admitting: Family Medicine

## 2020-03-17 ENCOUNTER — Other Ambulatory Visit: Payer: Self-pay | Admitting: Family Medicine

## 2020-03-17 DIAGNOSIS — I10 Essential (primary) hypertension: Secondary | ICD-10-CM | POA: Diagnosis not present

## 2020-03-17 DIAGNOSIS — Z0000 Encounter for general adult medical examination without abnormal findings: Secondary | ICD-10-CM | POA: Diagnosis not present

## 2020-03-17 DIAGNOSIS — G43909 Migraine, unspecified, not intractable, without status migrainosus: Secondary | ICD-10-CM | POA: Diagnosis not present

## 2020-03-17 DIAGNOSIS — E1165 Type 2 diabetes mellitus with hyperglycemia: Secondary | ICD-10-CM | POA: Diagnosis not present

## 2020-03-17 DIAGNOSIS — F325 Major depressive disorder, single episode, in full remission: Secondary | ICD-10-CM | POA: Diagnosis not present

## 2020-03-19 LAB — CYTOLOGY - PAP
Comment: NEGATIVE
Diagnosis: NEGATIVE
High risk HPV: NEGATIVE

## 2020-04-01 ENCOUNTER — Other Ambulatory Visit: Payer: Self-pay

## 2020-04-01 ENCOUNTER — Ambulatory Visit (AMBULATORY_SURGERY_CENTER): Payer: Self-pay

## 2020-04-01 VITALS — Ht 64.0 in | Wt 170.0 lb

## 2020-04-01 DIAGNOSIS — C186 Malignant neoplasm of descending colon: Secondary | ICD-10-CM

## 2020-04-01 DIAGNOSIS — Z1211 Encounter for screening for malignant neoplasm of colon: Secondary | ICD-10-CM

## 2020-04-01 MED ORDER — NA SULFATE-K SULFATE-MG SULF 17.5-3.13-1.6 GM/177ML PO SOLN: 1.0000 | Freq: Once | ORAL | 0 refills | Status: AC

## 2020-04-01 NOTE — Progress Notes (Signed)
No allergies to soy or egg Pt is not on blood thinners or diet pills Denies issues with sedation/intubation Denies atrial flutter/fib Denies constipation   Emmi instructions given to pt  Pt is aware of Covid safety and care partner requirements.  

## 2020-04-02 ENCOUNTER — Encounter: Payer: Self-pay | Admitting: Gastroenterology

## 2020-04-02 DIAGNOSIS — Z03818 Encounter for observation for suspected exposure to other biological agents ruled out: Secondary | ICD-10-CM | POA: Diagnosis not present

## 2020-04-02 DIAGNOSIS — Z20822 Contact with and (suspected) exposure to covid-19: Secondary | ICD-10-CM | POA: Diagnosis not present

## 2020-04-15 ENCOUNTER — Other Ambulatory Visit: Payer: Self-pay

## 2020-04-15 ENCOUNTER — Ambulatory Visit (AMBULATORY_SURGERY_CENTER): Payer: BC Managed Care – PPO | Admitting: Gastroenterology

## 2020-04-15 ENCOUNTER — Encounter: Payer: Self-pay | Admitting: Gastroenterology

## 2020-04-15 VITALS — BP 130/88 | HR 83 | Temp 97.3°F | Resp 24 | Ht 64.0 in | Wt 170.0 lb

## 2020-04-15 DIAGNOSIS — Z85038 Personal history of other malignant neoplasm of large intestine: Secondary | ICD-10-CM | POA: Diagnosis not present

## 2020-04-15 DIAGNOSIS — D123 Benign neoplasm of transverse colon: Secondary | ICD-10-CM

## 2020-04-15 DIAGNOSIS — Z1211 Encounter for screening for malignant neoplasm of colon: Secondary | ICD-10-CM | POA: Diagnosis not present

## 2020-04-15 MED ORDER — SODIUM CHLORIDE 0.9 % IV SOLN: 500.0000 mL | INTRAVENOUS | Status: DC

## 2020-04-15 NOTE — Patient Instructions (Signed)
HANDOUTS PROVIDED ON: POLYPS  The polyps removed today have been sent for pathology.  The results can take 1-3 weeks to receive.  When your next colonoscopy should occur will be based on the pathology results.    You may resume your previous diet and medication schedule.  Thank you for allowing [place] to care for you today!!!   YOU HAD AN ENDOSCOPIC PROCEDURE TODAY AT [facility]:   Refer to the procedure report that was given to you for any specific questions about what was found during the examination.  If the procedure report does not answer your questions, please call your gastroenterologist to clarify.  If you requested that your care partner not be given the details of your procedure findings, then the procedure report has been included in a sealed envelope for you to review at your convenience later.  YOU SHOULD EXPECT: Some feelings of bloating in the abdomen. Passage of more gas than usual.  Walking can help get rid of the air that was put into your GI tract during the procedure and reduce the bloating. If you had a lower endoscopy (such as a colonoscopy or flexible sigmoidoscopy) you may notice spotting of blood in your stool or on the toilet paper. If you underwent a bowel prep for your procedure, you may not have a normal bowel movement for a few days.  Please Note:  You might notice some irritation and congestion in your nose or some drainage.  This is from the oxygen used during your procedure.  There is no need for concern and it should clear up in a day or so.  SYMPTOMS TO REPORT IMMEDIATELY:   Following lower endoscopy (colonoscopy or flexible sigmoidoscopy):  Excessive amounts of blood in the stool  Significant tenderness or worsening of abdominal pains  Swelling of the abdomen that is new, acute  Fever of 100F or higher  For urgent or emergent issues, a gastroenterologist can be reached at any hour by calling [phone]. Do not use MyChart messaging for  urgent concerns.    DIET:  We do recommend a small meal at first, but then you may proceed to your regular diet.  Drink plenty of fluids but you should avoid alcoholic beverages for 24 hours.  ACTIVITY:  You should plan to take it easy for the rest of today and you should NOT DRIVE or use heavy machinery until tomorrow (because of the sedation medicines used during the test).    FOLLOW UP: Our staff will call the number listed on your records Monday morning between 7:15 am and 8:15 am to check on you and address any questions or concerns that you may have regarding the information given to you following your procedure. If we do not reach you, we will leave a message.  We will attempt to reach you two times.  During this call, we will ask if you have developed any symptoms of COVID 19. If you develop any symptoms (ie: fever, flu-like symptoms, shortness of breath, cough etc.) before then, please call [phone].  If you test positive for Covid 19 in the 2 weeks post procedure, please call and report this information to [place].    If any biopsies were taken you will be contacted by phone or by letter within the next 1-3 weeks.  Please call us at [phone] if you have not heard about the biopsies in 3 weeks.    SIGNATURES/CONFIDENTIALITY: You and/or your care partner have signed paperwork which will  be entered into your electronic medical record.  These signatures attest to the fact that that the information above on your After Visit Summary has been reviewed and is understood.  Full responsibility of the confidentiality of this discharge information lies with you and/or your care-partner.

## 2020-04-15 NOTE — Progress Notes (Signed)
Vs CW  Pt's states no medical or surgical changes since previsit or office visit.  

## 2020-04-15 NOTE — Progress Notes (Signed)
Called to room to assist during endoscopic procedure.  Patient ID and intended procedure confirmed with present staff. Received instructions for my participation in the procedure from the performing physician.  

## 2020-04-15 NOTE — Op Note (Signed)
[facility] Patient Name: Taylor Whitney Procedure Date: 04/15/2020 8:20 AM MRN: [id number] Endoscopist: Ladene Artist , MD Age: 49 Referring MD:  Date of Birth: 09/13/1970 Gender: Female Account #: [id number] Procedure:                Colonoscopy Indications:              High risk colon cancer surveillance: Personal                            history of colon cancer Medicines:                Monitored Anesthesia Care Procedure:                Pre-Anesthesia Assessment:                           - Prior to the procedure, a History and Physical                            was performed, and patient medications and                            allergies were reviewed. The patient's tolerance of                            previous anesthesia was also reviewed. The risks                            and benefits of the procedure and the sedation                            options and risks were discussed with the patient.                            All questions were answered, and informed consent                            was obtained. Prior Anticoagulants: The patient has                            taken no previous anticoagulant or antiplatelet                            agents. ASA Grade Assessment: II - A patient with                            mild systemic disease. After reviewing the risks                            and benefits, the patient was deemed in                            satisfactory condition to undergo the procedure.  After obtaining informed consent, the colonoscope                            was passed under direct vision. Throughout the                            procedure, the patient's blood pressure, pulse, and                            oxygen saturations were monitored continuously. The                            Colonoscope was introduced through the anus and                            advanced to the the cecum,  identified by                            appendiceal orifice and ileocecal valve. The                            ileocecal valve, appendiceal orifice, and rectum                            were photographed. The quality of the bowel                            preparation was good. The colonoscopy was performed                            without difficulty. The patient tolerated the                            procedure well. Scope In: 8:30:21 AM Scope Out: 8:43:23 AM Scope Withdrawal Time: 0 hours 12 minutes 1 second  Total Procedure Duration: 0 hours 13 minutes 2 seconds  Findings:                 The perianal and digital rectal examinations were                            normal.                           Four sessile polyps were found in the transverse                            colon. The polyps were 6 to 8 mm in size. These                            polyps were removed with a cold snare. Resection                            and retrieval were complete.  There was evidence of a prior end-to-end                            colo-colonic anastomosis in the sigmoid colon. This                            was patent and was characterized by healthy                            appearing mucosa. The anastomosis was traversed.                           The exam was otherwise without abnormality on                            direct and retroflexion views. Complications:            No immediate complications. Estimated blood loss:                            None. Estimated Blood Loss:     Estimated blood loss: none. Estimated blood loss:                            none. Impression:               - Four 6 to 8 mm polyps in the transverse colon,                            removed with a cold snare. Resected and retrieved.                           - Patent end-to-end colo-colonic anastomosis,                            characterized by healthy appearing mucosa.                            - The examination was otherwise normal on direct                            and retroflexion views. Recommendation:           - Repeat colonoscopy, likely in 3 years, for                            surveillance based on pathology results.                           - Patient has a contact number available for                            emergencies. The signs and symptoms of potential                            delayed complications were discussed with  the                            patient. Return to normal activities tomorrow.                            Written discharge instructions were provided to the                            patient.                           - Resume previous diet.                           - Continue present medications.                           - Await pathology results. Ladene Artist, MD 04/15/2020 8:47:13 AM This report has been signed electronically.

## 2020-04-15 NOTE — Progress Notes (Signed)
Report to PACU, RN, vss, BBS= Clear.  

## 2020-04-19 ENCOUNTER — Telehealth: Payer: Self-pay

## 2020-04-19 NOTE — Telephone Encounter (Signed)
LVM

## 2020-04-19 NOTE — Telephone Encounter (Signed)
°  Follow up Call-  Call back number 04/15/2020  Post procedure Call Back phone  # [phone]  Permission to leave phone message Yes  Some recent data might be hidden    1st follow up call made. NALM

## 2020-05-04 ENCOUNTER — Encounter: Payer: Self-pay | Admitting: Gastroenterology

## 2020-05-31 DIAGNOSIS — E1169 Type 2 diabetes mellitus with other specified complication: Secondary | ICD-10-CM | POA: Diagnosis not present

## 2020-05-31 DIAGNOSIS — I10 Essential (primary) hypertension: Secondary | ICD-10-CM | POA: Diagnosis not present

## 2020-05-31 DIAGNOSIS — Z7984 Long term (current) use of oral hypoglycemic drugs: Secondary | ICD-10-CM | POA: Diagnosis not present

## 2020-06-08 DIAGNOSIS — Z20828 Contact with and (suspected) exposure to other viral communicable diseases: Secondary | ICD-10-CM | POA: Diagnosis not present

## 2020-07-08 DIAGNOSIS — R040 Epistaxis: Secondary | ICD-10-CM | POA: Diagnosis not present

## 2020-07-12 DIAGNOSIS — K921 Melena: Secondary | ICD-10-CM | POA: Diagnosis not present

## 2020-07-12 DIAGNOSIS — R519 Headache, unspecified: Secondary | ICD-10-CM | POA: Diagnosis not present

## 2020-07-12 DIAGNOSIS — R040 Epistaxis: Secondary | ICD-10-CM | POA: Diagnosis not present

## 2020-07-12 DIAGNOSIS — I10 Essential (primary) hypertension: Secondary | ICD-10-CM | POA: Diagnosis not present

## 2020-07-29 DIAGNOSIS — D649 Anemia, unspecified: Secondary | ICD-10-CM | POA: Diagnosis not present

## 2020-08-04 DIAGNOSIS — D649 Anemia, unspecified: Secondary | ICD-10-CM | POA: Diagnosis not present

## 2020-11-02 DIAGNOSIS — I10 Essential (primary) hypertension: Secondary | ICD-10-CM | POA: Diagnosis not present

## 2020-11-02 DIAGNOSIS — E1169 Type 2 diabetes mellitus with other specified complication: Secondary | ICD-10-CM | POA: Diagnosis not present

## 2020-11-02 DIAGNOSIS — Z7984 Long term (current) use of oral hypoglycemic drugs: Secondary | ICD-10-CM | POA: Diagnosis not present

## 2020-11-02 DIAGNOSIS — E611 Iron deficiency: Secondary | ICD-10-CM | POA: Diagnosis not present

## 2020-11-02 DIAGNOSIS — F325 Major depressive disorder, single episode, in full remission: Secondary | ICD-10-CM | POA: Diagnosis not present

## 2020-12-01 DIAGNOSIS — N946 Dysmenorrhea, unspecified: Secondary | ICD-10-CM | POA: Diagnosis not present

## 2020-12-01 DIAGNOSIS — Z86018 Personal history of other benign neoplasm: Secondary | ICD-10-CM | POA: Diagnosis not present

## 2020-12-07 DIAGNOSIS — N946 Dysmenorrhea, unspecified: Secondary | ICD-10-CM | POA: Diagnosis not present

## 2020-12-07 DIAGNOSIS — D259 Leiomyoma of uterus, unspecified: Secondary | ICD-10-CM | POA: Diagnosis not present

## 2021-01-07 DIAGNOSIS — R1030 Lower abdominal pain, unspecified: Secondary | ICD-10-CM | POA: Diagnosis not present

## 2021-02-08 DIAGNOSIS — N83202 Unspecified ovarian cyst, left side: Secondary | ICD-10-CM | POA: Diagnosis not present

## 2021-03-10 DIAGNOSIS — Z23 Encounter for immunization: Secondary | ICD-10-CM | POA: Diagnosis not present

## 2021-03-21 DIAGNOSIS — I10 Essential (primary) hypertension: Secondary | ICD-10-CM | POA: Diagnosis not present

## 2021-03-21 DIAGNOSIS — E1165 Type 2 diabetes mellitus with hyperglycemia: Secondary | ICD-10-CM | POA: Diagnosis not present

## 2021-03-21 DIAGNOSIS — E611 Iron deficiency: Secondary | ICD-10-CM | POA: Diagnosis not present

## 2021-03-21 DIAGNOSIS — Z0000 Encounter for general adult medical examination without abnormal findings: Secondary | ICD-10-CM | POA: Diagnosis not present

## 2021-03-21 DIAGNOSIS — Z7984 Long term (current) use of oral hypoglycemic drugs: Secondary | ICD-10-CM | POA: Diagnosis not present

## 2021-03-21 DIAGNOSIS — F325 Major depressive disorder, single episode, in full remission: Secondary | ICD-10-CM | POA: Diagnosis not present

## 2021-07-05 DIAGNOSIS — M5442 Lumbago with sciatica, left side: Secondary | ICD-10-CM | POA: Diagnosis not present

## 2021-07-19 ENCOUNTER — Other Ambulatory Visit: Payer: Self-pay

## 2021-07-19 ENCOUNTER — Ambulatory Visit
Admission: RE | Admit: 2021-07-19 | Discharge: 2021-07-19 | Disposition: A | Discharge status: Home / Self Care | Payer: BC Managed Care – PPO | Source: Ambulatory Visit | Attending: Emergency Medicine | Admitting: Emergency Medicine

## 2021-07-19 VITALS — BP 153/92 | HR 98 | Temp 98.4°F | Resp 18

## 2021-07-19 DIAGNOSIS — N390 Urinary tract infection, site not specified: Secondary | ICD-10-CM | POA: Diagnosis not present

## 2021-07-19 DIAGNOSIS — R300 Dysuria: Secondary | ICD-10-CM | POA: Insufficient documentation

## 2021-07-19 LAB — POCT URINALYSIS DIP (MANUAL ENTRY)
Bilirubin, UA: NEGATIVE
Glucose, UA: NEGATIVE mg/dL
Ketones, POC UA: NEGATIVE mg/dL
Nitrite, UA: POSITIVE — AB
Protein Ur, POC: 100 mg/dL — AB
Spec Grav, UA: 1.025 (ref 1.010–1.025)
Urobilinogen, UA: 0.2 E.U./dL
pH, UA: 6 (ref 5.0–8.0)

## 2021-07-19 NOTE — ED Provider Notes (Signed)
[facility]    CSN: [id number] Arrival date & time: 07/19/21  0849    HISTORY   Chief Complaint  Patient presents with   Urinary Frequency   Appointment   HPI Taylor Whitney is a 51 y.o. female. Pt c/o increased frequency of urination and pelvic pressure that began last week.  Patient denies burning with urination, fever, aches, chills, nausea, vomiting, diarrhea, genital lesion, excessive vaginal discharge, vulvovaginitis, no exposure to STD.  Patient reports a history of type 2 diabetes.  The history is provided by the patient.  Past Medical History:  Diagnosis Date   Adenocarcinoma of descending colon ([place]) 12/2015   Flex sigmoidoscopy biopsy of descending colon mass is adenocarcinoma/notes 01/18/2016   Anemia    Depression    Family history of kidney cancer    History of colostomy reversal    Hodgkin lymphoma ([place]) dx'd 01/1995   Dr. Burr Medico   Large bowel obstruction s/p colectomy/colostomy Aug 2017 01/16/2016   Migraine    "maybe once/month" (01/18/2016)   Type II diabetes mellitus ([place]) 06/2015   Patient Active Problem List   Diagnosis Date Noted   Genetic testing 03/28/2016   Iron deficiency anemia 03/16/2016   Family history of kidney cancer    Cancer of left colon s/p colostomy takedown 05/11/2016    Diabetes mellitus with complication ([place])    Hypokalemia    Large bowel obstruction s/p colectomy/colostomy Aug 2017 01/16/2016   DM (dermatomyositis) 01/16/2016   Depression 01/16/2016   Electrolyte abnormality 01/16/2016   Past Surgical History:  Procedure Laterality Date   COLON RESECTION N/A 01/18/2016   Procedure: OPEN PARTIAL COLECTOMY WITH END COLOSTOMY;  Surgeon: Mickeal Skinner, MD;  Location: [facility];  Service: General;  Laterality: N/A;   COLOSTOMY  01/18/2016   also 2018   COLOSTOMY TAKEDOWN N/A 05/11/2016   Procedure: LAPAROSCOPIC CONVERTED TO OPEN REVERSAL OF END COLOSTOMY;  Surgeon: Mickeal Skinner, MD;  Location: [facility];   Service: General;  Laterality: N/A;   DILATION AND CURETTAGE OF UTERUS     FLEXIBLE SIGMOIDOSCOPY N/A 01/17/2016   Procedure: FLEXIBLE SIGMOIDOSCOPY;  Surgeon: Ladene Artist, MD;  [facility];  Service: Endoscopy;  Laterality: N/A;   LYMPH NODE [place]   PARTIAL COLECTOMY  01/18/2016   PROCTOSCOPY  05/11/2016   Procedure: PROCTOSCOPY;  Surgeon: Mickeal Skinner, MD;  Location: [facility];  Service: General;;   [place]   OB History   No obstetric history on file.    Home Medications    Prior to Admission medications   Medication Sig Start Date End Date Taking? Authorizing Provider  lisinopril (ZESTRIL) 40 MG tablet Take 40 mg by mouth daily. 02/03/21   [provider]  metFORMIN (GLUCOPHAGE-XR) 500 MG 24 hr tablet Take 500 mg by mouth 2 (two) times daily. 10/27/15   [provider]  RYBELSUS 14 MG TABS Take 1 tablet by mouth every morning. 12/02/19   [provider]  simvastatin (ZOCOR) 5 MG tablet TK 1 T PO QD IN THE EVE 02/05/19   [provider]  venlafaxine XR (EFFEXOR-XR) 75 MG 24 hr capsule Take 225 mg by mouth at bedtime. 12/28/15   [provider]  prochlorperazine (COMPAZINE) 10 MG tablet Take 1 tablet (10 mg total) by mouth every 6 (six) hours as needed (Nausea or vomiting). 02/01/16 10/05/16  Truitt Merle, MD   Family History Family History  Problem Relation Age of Onset   Diabetes Mother    Cancer Father        prostate cancer   Cancer Paternal Uncle        renal cancer ; smoker   Diabetes Maternal Grandmother    Diabetes Maternal Grandfather    Clotting disorder Paternal Grandmother    COPD Paternal Grandfather    Colon cancer Neg Hx    Colon polyps Neg Hx    Esophageal cancer Neg Hx    Stomach cancer Neg Hx    Rectal cancer Neg Hx    Social History Social History   Tobacco Use   Smoking status: Never   Smokeless tobacco: Never  Vaping Use   Vaping Use: Never  used  Substance Use Topics   Alcohol use: Yes    Comment: rare social; once socially   Drug use: No   Allergies   Sulfa antibiotics  Review of Systems Review of Systems Pertinent findings noted in history of present illness.   Physical Exam Triage Vital Signs ED Triage Vitals  Enc Vitals Group     BP 03/25/21 0827 (!) 147/82     Pulse Rate 03/25/21 0827 72     Resp 03/25/21 0827 18     Temp 03/25/21 0827 98.3 F (36.8 C)     Temp Source 03/25/21 0827 Oral     SpO2 03/25/21 0827 98 %     Weight --      Height --      Head Circumference --      Peak Flow --      Pain Score 03/25/21 0826 5     Pain Loc --      Pain Edu? --      Excl. in [place]? --   No data found.  Updated Vital Signs BP (!) 153/92 (BP Location: Right Arm)    Pulse 98    Temp 98.4 F (36.9 C) (Oral)    Resp 18    LMP 07/05/2021 (Approximate)    SpO2 99%   Physical Exam Vitals and nursing note reviewed.  Constitutional:      General: She is not in acute distress.    Appearance: Normal appearance. She is not ill-appearing.  HENT:     Head: Normocephalic and atraumatic.  Eyes:     General: Lids are normal.        Right eye: No discharge.        Left eye: No discharge.     Extraocular Movements: Extraocular movements intact.     Conjunctiva/sclera: Conjunctivae normal.     Right eye: Right conjunctiva is not injected.     Left eye: Left conjunctiva is not injected.  Neck:     Trachea: Trachea and phonation normal.  Cardiovascular:     Rate and Rhythm: Normal rate and regular rhythm.     Pulses: Normal pulses.     Heart sounds: Normal heart sounds. No murmur heard.   No friction rub. No gallop.  Pulmonary:     Effort: Pulmonary effort is normal. No accessory muscle usage, prolonged expiration or respiratory distress.     Breath sounds: Normal breath sounds. No stridor, decreased air movement or transmitted upper airway sounds. No decreased breath sounds, wheezing, rhonchi or rales.  Chest:      Chest wall: No tenderness.  Abdominal:     General: Abdomen is flat. Bowel sounds are normal.     Palpations: Abdomen is soft.     Tenderness:  There is no abdominal tenderness.  Musculoskeletal:        General: Normal range of motion.     Cervical back: Normal range of motion and neck supple. Normal range of motion.  Lymphadenopathy:     Cervical: No cervical adenopathy.  Skin:    General: Skin is warm and dry.     Findings: No erythema or rash.  Neurological:     General: No focal deficit present.     Mental Status: She is alert and oriented to person, place, and time.  Psychiatric:        Mood and Affect: Mood normal.        Behavior: Behavior normal.    Visual Acuity Right Eye Distance:   Left Eye Distance:   Bilateral Distance:    Right Eye Near:   Left Eye Near:    Bilateral Near:     UC Couse / Diagnostics / Procedures:    EKG  Radiology No results found.  Procedures Procedures (including critical care time)  UC Diagnoses / Final Clinical Impressions(s)   I have reviewed the triage vital signs and the nursing notes.  Pertinent labs & imaging results that were available during my care of the patient were reviewed by me and considered in my medical decision making (see chart for details).    Final diagnoses:  Dysuria  Acute lower urinary tract infection   Patient provided with a prescription for fosfomycin given lack of systemic complaints suggestive of complicated urinary tract infection and patient's known sulfa allergy.  Urine culture will be performed and antibiotic treatment will be adjusted as indicated based on that result.  Return precautions advised.  ED Prescriptions   None    PDMP not reviewed this encounter.  Pending results:  Labs Reviewed  POCT URINALYSIS DIP (MANUAL ENTRY) - Abnormal; Notable for the following components:      Result Value   Clarity, UA cloudy (*)    Blood, UA moderate (*)    Protein Ur, POC =100 (*)    Nitrite, UA  Positive (*)    Leukocytes, UA Small (1+) (*)    All other components within normal limits  URINE CULTURE    Medications Ordered in UC: Medications - No data to display  Disposition Upon Discharge:  Condition: stable for discharge home  Patient presented with concern for an acute illness with associated systemic symptoms and significant discomfort requiring urgent management. In my opinion, this is a condition that a prudent lay person (someone who possesses an average knowledge of health and medicine) may potentially expect to result in complications if not addressed urgently such as respiratory distress, impairment of bodily function or dysfunction of bodily organs.   As such, the patient has been evaluated and assessed, work-up was performed and treatment was provided in alignment with urgent care protocols and evidence based medicine.  Patient/parent/caregiver has been advised that the patient may require follow up for further testing and/or treatment if the symptoms continue in spite of treatment, as clinically indicated and appropriate.  Routine symptom specific, illness specific and/or disease specific instructions were discussed with the patient and/or caregiver at length.  Prevention strategies for avoiding STD exposure were also discussed.  The patient will follow up with their current PCP if and as advised. If the patient does not currently have a PCP we will assist them in obtaining one.   The patient may need specialty follow up if the symptoms continue, in spite of conservative treatment and management,  for further workup, evaluation, consultation and treatment as clinically indicated and appropriate.  Patient/parent/caregiver verbalized understanding and agreement of plan as discussed.  All questions were addressed during visit.  Please see discharge instructions below for further details of plan.  Discharge Instructions:   Discharge Instructions      The urinalysis that  we performed in the clinic today was abnormal, there were nitrates present in your urine which is a positive finding for bacterial urinary tract infection.  I have sent a prescription for an antibiotic called fosfomycin to your pharmacy, it is a single dose that can be taken with or without food.    Urine culture will be performed to help notify the bacterial organism causing your infection.  The result of the urine culture will be available in the next 3 to 5 days and will be posted to your MyChart account.  If there is a concern that the bacterial organism isolated in your urine is not susceptible to fosfomycin, you will be contacted by phone and advised of further treatment recommendations, if any.   If you have not had complete resolution of your symptoms after completing treatment as prescribed, please return to urgent care for repeat evaluation or follow-up with your primary care provider.   Thank you for visiting urgent care today.  I appreciate the opportunity to participate in your care.     This office note has been dictated using Museum/gallery curator.  Unfortunately, and despite my best efforts, this method of dictation can sometimes lead to occasional typographical or grammatical errors.  I apologize in advance if this occurs.      Lynden Oxford Scales, [place] 07/19/21 [phone]

## 2021-07-19 NOTE — Discharge Instructions (Addendum)
The urinalysis that we performed in the clinic today was abnormal, there were nitrates present in your urine which is a positive finding for bacterial urinary tract infection.  I have sent a prescription for an antibiotic called fosfomycin to your pharmacy, it is a single dose that can be taken with or without food.    Urine culture will be performed to help notify the bacterial organism causing your infection.  The result of the urine culture will be available in the next 3 to 5 days and will be posted to your MyChart account.  If there is a concern that the bacterial organism isolated in your urine is not susceptible to fosfomycin, you will be contacted by phone and advised of further treatment recommendations, if any.   If you have not had complete resolution of your symptoms after completing treatment as prescribed, please return to urgent care for repeat evaluation or follow-up with your primary care provider.   Thank you for visiting urgent care today.  I appreciate the opportunity to participate in your care.

## 2021-07-19 NOTE — ED Triage Notes (Signed)
Pt c/o frequent urination and pressure that began last week.

## 2021-07-21 ENCOUNTER — Telehealth: Payer: Self-pay | Admitting: Emergency Medicine

## 2021-07-21 LAB — URINE CULTURE: Culture: >=100000 — AB

## 2021-07-21 MED ORDER — FOSFOMYCIN TROMETHAMINE 3 G PO PACK: 3.0000 g | PACK | Freq: Once | ORAL | 0 refills | Status: AC

## 2021-07-21 NOTE — Telephone Encounter (Signed)
Urine culture positive for E. coli, prescription for fosfomycin sent to pharmacy.

## 2021-08-01 DIAGNOSIS — M5442 Lumbago with sciatica, left side: Secondary | ICD-10-CM | POA: Diagnosis not present

## 2021-08-02 ENCOUNTER — Ambulatory Visit
Admission: RE | Admit: 2021-08-02 | Discharge: 2021-08-02 | Disposition: A | Discharge status: Home / Self Care | Payer: BC Managed Care – PPO | Source: Ambulatory Visit | Attending: Sports Medicine | Admitting: Sports Medicine

## 2021-08-02 ENCOUNTER — Other Ambulatory Visit: Payer: Self-pay | Admitting: Sports Medicine

## 2021-08-02 ENCOUNTER — Other Ambulatory Visit: Payer: Self-pay | Admitting: Family Medicine

## 2021-08-02 DIAGNOSIS — M25552 Pain in left hip: Secondary | ICD-10-CM

## 2021-08-02 DIAGNOSIS — M545 Low back pain, unspecified: Secondary | ICD-10-CM | POA: Diagnosis not present

## 2021-08-02 DIAGNOSIS — Z1231 Encounter for screening mammogram for malignant neoplasm of breast: Secondary | ICD-10-CM

## 2021-08-09 ENCOUNTER — Ambulatory Visit
Admission: RE | Admit: 2021-08-09 | Discharge: 2021-08-09 | Disposition: A | Discharge status: Home / Self Care | Payer: BC Managed Care – PPO | Source: Ambulatory Visit

## 2021-08-09 DIAGNOSIS — Z1231 Encounter for screening mammogram for malignant neoplasm of breast: Secondary | ICD-10-CM

## 2021-08-09 DIAGNOSIS — M2569 Stiffness of other specified joint, not elsewhere classified: Secondary | ICD-10-CM | POA: Diagnosis not present

## 2021-08-09 DIAGNOSIS — M5416 Radiculopathy, lumbar region: Secondary | ICD-10-CM | POA: Diagnosis not present

## 2021-08-09 DIAGNOSIS — M62552 Muscle wasting and atrophy, not elsewhere classified, left thigh: Secondary | ICD-10-CM | POA: Diagnosis not present

## 2021-08-09 DIAGNOSIS — R269 Unspecified abnormalities of gait and mobility: Secondary | ICD-10-CM | POA: Diagnosis not present

## 2021-08-18 DIAGNOSIS — R269 Unspecified abnormalities of gait and mobility: Secondary | ICD-10-CM | POA: Diagnosis not present

## 2021-08-18 DIAGNOSIS — M62552 Muscle wasting and atrophy, not elsewhere classified, left thigh: Secondary | ICD-10-CM | POA: Diagnosis not present

## 2021-08-18 DIAGNOSIS — M5416 Radiculopathy, lumbar region: Secondary | ICD-10-CM | POA: Diagnosis not present

## 2021-08-18 DIAGNOSIS — M2569 Stiffness of other specified joint, not elsewhere classified: Secondary | ICD-10-CM | POA: Diagnosis not present

## 2021-08-23 DIAGNOSIS — M62552 Muscle wasting and atrophy, not elsewhere classified, left thigh: Secondary | ICD-10-CM | POA: Diagnosis not present

## 2021-08-23 DIAGNOSIS — M2569 Stiffness of other specified joint, not elsewhere classified: Secondary | ICD-10-CM | POA: Diagnosis not present

## 2021-08-23 DIAGNOSIS — R269 Unspecified abnormalities of gait and mobility: Secondary | ICD-10-CM | POA: Diagnosis not present

## 2021-08-23 DIAGNOSIS — M5416 Radiculopathy, lumbar region: Secondary | ICD-10-CM | POA: Diagnosis not present

## 2021-08-30 DIAGNOSIS — M5442 Lumbago with sciatica, left side: Secondary | ICD-10-CM | POA: Diagnosis not present

## 2021-09-01 DIAGNOSIS — M2569 Stiffness of other specified joint, not elsewhere classified: Secondary | ICD-10-CM | POA: Diagnosis not present

## 2021-09-01 DIAGNOSIS — M5416 Radiculopathy, lumbar region: Secondary | ICD-10-CM | POA: Diagnosis not present

## 2021-09-01 DIAGNOSIS — M62552 Muscle wasting and atrophy, not elsewhere classified, left thigh: Secondary | ICD-10-CM | POA: Diagnosis not present

## 2021-09-01 DIAGNOSIS — R269 Unspecified abnormalities of gait and mobility: Secondary | ICD-10-CM | POA: Diagnosis not present

## 2021-09-08 DIAGNOSIS — R269 Unspecified abnormalities of gait and mobility: Secondary | ICD-10-CM | POA: Diagnosis not present

## 2021-09-08 DIAGNOSIS — M62552 Muscle wasting and atrophy, not elsewhere classified, left thigh: Secondary | ICD-10-CM | POA: Diagnosis not present

## 2021-09-08 DIAGNOSIS — M5416 Radiculopathy, lumbar region: Secondary | ICD-10-CM | POA: Diagnosis not present

## 2021-09-08 DIAGNOSIS — M2569 Stiffness of other specified joint, not elsewhere classified: Secondary | ICD-10-CM | POA: Diagnosis not present

## 2021-09-15 DIAGNOSIS — R269 Unspecified abnormalities of gait and mobility: Secondary | ICD-10-CM | POA: Diagnosis not present

## 2021-09-15 DIAGNOSIS — M62552 Muscle wasting and atrophy, not elsewhere classified, left thigh: Secondary | ICD-10-CM | POA: Diagnosis not present

## 2021-09-15 DIAGNOSIS — M5416 Radiculopathy, lumbar region: Secondary | ICD-10-CM | POA: Diagnosis not present

## 2021-09-15 DIAGNOSIS — M2569 Stiffness of other specified joint, not elsewhere classified: Secondary | ICD-10-CM | POA: Diagnosis not present

## 2021-09-20 DIAGNOSIS — E1169 Type 2 diabetes mellitus with other specified complication: Secondary | ICD-10-CM | POA: Diagnosis not present

## 2021-09-20 DIAGNOSIS — I10 Essential (primary) hypertension: Secondary | ICD-10-CM | POA: Diagnosis not present

## 2021-09-20 DIAGNOSIS — E611 Iron deficiency: Secondary | ICD-10-CM | POA: Diagnosis not present

## 2021-09-22 DIAGNOSIS — M62552 Muscle wasting and atrophy, not elsewhere classified, left thigh: Secondary | ICD-10-CM | POA: Diagnosis not present

## 2021-09-22 DIAGNOSIS — R269 Unspecified abnormalities of gait and mobility: Secondary | ICD-10-CM | POA: Diagnosis not present

## 2021-09-22 DIAGNOSIS — M2569 Stiffness of other specified joint, not elsewhere classified: Secondary | ICD-10-CM | POA: Diagnosis not present

## 2021-09-22 DIAGNOSIS — M5416 Radiculopathy, lumbar region: Secondary | ICD-10-CM | POA: Diagnosis not present

## 2021-09-29 ENCOUNTER — Other Ambulatory Visit: Payer: Self-pay | Admitting: Sports Medicine

## 2021-09-29 DIAGNOSIS — M5442 Lumbago with sciatica, left side: Secondary | ICD-10-CM

## 2021-09-30 DIAGNOSIS — M62552 Muscle wasting and atrophy, not elsewhere classified, left thigh: Secondary | ICD-10-CM | POA: Diagnosis not present

## 2021-09-30 DIAGNOSIS — R269 Unspecified abnormalities of gait and mobility: Secondary | ICD-10-CM | POA: Diagnosis not present

## 2021-09-30 DIAGNOSIS — M5416 Radiculopathy, lumbar region: Secondary | ICD-10-CM | POA: Diagnosis not present

## 2021-09-30 DIAGNOSIS — M2569 Stiffness of other specified joint, not elsewhere classified: Secondary | ICD-10-CM | POA: Diagnosis not present

## 2021-10-07 ENCOUNTER — Ambulatory Visit
Admission: RE | Admit: 2021-10-07 | Discharge: 2021-10-07 | Disposition: A | Discharge status: Home / Self Care | Payer: BC Managed Care – PPO | Source: Ambulatory Visit | Attending: Sports Medicine | Admitting: Sports Medicine

## 2021-10-07 DIAGNOSIS — M5136 Other intervertebral disc degeneration, lumbar region: Secondary | ICD-10-CM | POA: Diagnosis not present

## 2021-10-07 DIAGNOSIS — M48061 Spinal stenosis, lumbar region without neurogenic claudication: Secondary | ICD-10-CM | POA: Diagnosis not present

## 2021-10-07 DIAGNOSIS — M5416 Radiculopathy, lumbar region: Secondary | ICD-10-CM | POA: Diagnosis not present

## 2021-10-07 DIAGNOSIS — M545 Low back pain, unspecified: Secondary | ICD-10-CM | POA: Diagnosis not present

## 2021-10-07 DIAGNOSIS — M5442 Lumbago with sciatica, left side: Secondary | ICD-10-CM

## 2021-10-07 DIAGNOSIS — M62552 Muscle wasting and atrophy, not elsewhere classified, left thigh: Secondary | ICD-10-CM | POA: Diagnosis not present

## 2021-10-07 DIAGNOSIS — M2569 Stiffness of other specified joint, not elsewhere classified: Secondary | ICD-10-CM | POA: Diagnosis not present

## 2021-10-07 DIAGNOSIS — R269 Unspecified abnormalities of gait and mobility: Secondary | ICD-10-CM | POA: Diagnosis not present

## 2021-10-18 DIAGNOSIS — E1169 Type 2 diabetes mellitus with other specified complication: Secondary | ICD-10-CM | POA: Diagnosis not present

## 2021-10-18 DIAGNOSIS — I10 Essential (primary) hypertension: Secondary | ICD-10-CM | POA: Diagnosis not present

## 2021-10-26 ENCOUNTER — Other Ambulatory Visit: Payer: Self-pay | Admitting: Family Medicine

## 2021-10-26 DIAGNOSIS — R1900 Intra-abdominal and pelvic swelling, mass and lump, unspecified site: Secondary | ICD-10-CM

## 2021-10-31 ENCOUNTER — Other Ambulatory Visit: Payer: BC Managed Care – PPO

## 2021-11-01 ENCOUNTER — Ambulatory Visit
Admission: RE | Admit: 2021-11-01 | Discharge: 2021-11-01 | Disposition: A | Discharge status: Home / Self Care | Payer: BC Managed Care – PPO | Source: Ambulatory Visit | Attending: Family Medicine | Admitting: Family Medicine

## 2021-11-01 DIAGNOSIS — D259 Leiomyoma of uterus, unspecified: Secondary | ICD-10-CM | POA: Diagnosis not present

## 2021-11-01 DIAGNOSIS — R1900 Intra-abdominal and pelvic swelling, mass and lump, unspecified site: Secondary | ICD-10-CM | POA: Diagnosis not present

## 2021-11-14 DIAGNOSIS — R269 Unspecified abnormalities of gait and mobility: Secondary | ICD-10-CM | POA: Diagnosis not present

## 2021-11-14 DIAGNOSIS — M62552 Muscle wasting and atrophy, not elsewhere classified, left thigh: Secondary | ICD-10-CM | POA: Diagnosis not present

## 2021-11-14 DIAGNOSIS — M5416 Radiculopathy, lumbar region: Secondary | ICD-10-CM | POA: Diagnosis not present

## 2021-11-14 DIAGNOSIS — M2569 Stiffness of other specified joint, not elsewhere classified: Secondary | ICD-10-CM | POA: Diagnosis not present

## 2021-11-21 DIAGNOSIS — E1165 Type 2 diabetes mellitus with hyperglycemia: Secondary | ICD-10-CM | POA: Diagnosis not present

## 2021-11-21 DIAGNOSIS — D219 Benign neoplasm of connective and other soft tissue, unspecified: Secondary | ICD-10-CM | POA: Diagnosis not present

## 2021-11-21 DIAGNOSIS — N920 Excessive and frequent menstruation with regular cycle: Secondary | ICD-10-CM | POA: Diagnosis not present

## 2021-11-21 DIAGNOSIS — R102 Pelvic and perineal pain: Secondary | ICD-10-CM | POA: Diagnosis not present

## 2021-11-22 DIAGNOSIS — M2569 Stiffness of other specified joint, not elsewhere classified: Secondary | ICD-10-CM | POA: Diagnosis not present

## 2021-11-22 DIAGNOSIS — M62552 Muscle wasting and atrophy, not elsewhere classified, left thigh: Secondary | ICD-10-CM | POA: Diagnosis not present

## 2021-11-22 DIAGNOSIS — M5416 Radiculopathy, lumbar region: Secondary | ICD-10-CM | POA: Diagnosis not present

## 2021-11-22 DIAGNOSIS — R269 Unspecified abnormalities of gait and mobility: Secondary | ICD-10-CM | POA: Diagnosis not present

## 2021-11-28 ENCOUNTER — Ambulatory Visit
Admission: RE | Admit: 2021-11-28 | Discharge: 2021-11-28 | Disposition: A | Discharge status: Home / Self Care | Payer: BC Managed Care – PPO | Source: Ambulatory Visit

## 2021-11-28 VITALS — BP 102/70 | HR 105 | Temp 98.4°F | Resp 20

## 2021-11-28 DIAGNOSIS — L299 Pruritus, unspecified: Secondary | ICD-10-CM | POA: Diagnosis not present

## 2021-11-28 DIAGNOSIS — I10 Essential (primary) hypertension: Secondary | ICD-10-CM

## 2021-11-28 DIAGNOSIS — L568 Other specified acute skin changes due to ultraviolet radiation: Secondary | ICD-10-CM

## 2021-11-28 MED ORDER — HYDROXYZINE HCL 25 MG PO TABS: 12.5000 mg | ORAL_TABLET | Freq: Three times a day (TID) | ORAL | 0 refills | Status: DC | PRN

## 2021-11-28 MED ORDER — PREDNISONE 20 MG PO TABS: ORAL_TABLET | ORAL | 0 refills | Status: DC

## 2021-11-28 NOTE — ED Provider Notes (Signed)
[place]   MRN: [id number] DOB: 1971-03-24  Subjective:   Taylor Whitney is a 51 y.o. female presenting for 2-week history of persistent itchy rash over the forearms and superiormost portion of her forehead.  Patient has been on lisinopril and just had hydrochlorothiazide added about a month ago.  She did not notice anything initially but after the rash started she looked up if this was a possible side effect and found that photosensitivity was a possibility.  She has in the past 2 days started covering up better to sun exposed areas.  Has been using multiple over-the-counter creams without much relief.  She does have type 2 diabetes treated without insulin as well.  Has not had any other new exposures including poisonous plants, laundry detergents, hygiene products.  No rash anywhere else.  She previously had a sulfa drug rash but reports that it was very different and was diffusely spread over her entire body not like what she has now.  No current facility-administered medications for this encounter.  Current Outpatient Medications:    lisinopril (ZESTRIL) 40 MG tablet, Take 40 mg by mouth daily., Disp: , Rfl:    lisinopril-hydrochlorothiazide (ZESTORETIC) 20-12.5 MG tablet, Take 2 tablets by mouth daily., Disp: , Rfl:    metFORMIN (GLUCOPHAGE-XR) 500 MG 24 hr tablet, Take 500 mg by mouth 2 (two) times daily., Disp: , Rfl: 0   OZEMPIC, 0.25 OR 0.5 MG/DOSE, 2 MG/3ML SOPN, Inject 0.5 mg into the skin once a week., Disp: , Rfl:    RYBELSUS 14 MG TABS, Take 1 tablet by mouth every morning., Disp: , Rfl:    simvastatin (ZOCOR) 5 MG tablet, TK 1 T PO QD IN THE EVE, Disp: , Rfl:    venlafaxine XR (EFFEXOR-XR) 75 MG 24 hr capsule, Take 225 mg by mouth at bedtime., Disp: , Rfl: 5   Allergies  Allergen Reactions   Sulfa Antibiotics Rash    Past Medical History:  Diagnosis Date   Adenocarcinoma of descending colon ([place]) 12/2015   Flex sigmoidoscopy biopsy of  descending colon mass is adenocarcinoma/notes 01/18/2016   Anemia    Depression    Family history of kidney cancer    History of colostomy reversal    Hodgkin lymphoma ([place]) dx'd 01/1995   Dr. Burr Medico   Large bowel obstruction s/p colectomy/colostomy Aug 2017 01/16/2016   Migraine    "maybe once/month" (01/18/2016)   Type II diabetes mellitus ([place]) 06/2015     Past Surgical History:  Procedure Laterality Date   COLON RESECTION N/A 01/18/2016   Procedure: OPEN PARTIAL COLECTOMY WITH END COLOSTOMY;  Surgeon: Mickeal Skinner, MD;  Location: [facility];  Service: General;  Laterality: N/A;   COLOSTOMY  01/18/2016   also 2018   COLOSTOMY TAKEDOWN N/A 05/11/2016   Procedure: LAPAROSCOPIC CONVERTED TO OPEN REVERSAL OF END COLOSTOMY;  Surgeon: Mickeal Skinner, MD;  Location: [facility];  Service: General;  Laterality: N/A;   DILATION AND CURETTAGE OF UTERUS     FLEXIBLE SIGMOIDOSCOPY N/A 01/17/2016   Procedure: FLEXIBLE SIGMOIDOSCOPY;  Surgeon: Ladene Artist, MD;  Location[facility];  Service: Endoscopy;  Laterality: N/A;   LYMPH NODE [place]   PARTIAL COLECTOMY  01/18/2016   PROCTOSCOPY  05/11/2016   Procedure: PROCTOSCOPY;  Surgeon: Mickeal Skinner, MD;  Location: [facility];  Service: General;;   WISDOM TOOTH EXTRACTION  1990    Family History  Problem Relation Age  of Onset   Diabetes Mother    Cancer Father        prostate cancer   Cancer Paternal Uncle        renal cancer ; smoker   Diabetes Maternal Grandmother    Diabetes Maternal Grandfather    Clotting disorder Paternal Grandmother    COPD Paternal Grandfather    Colon cancer Neg Hx    Colon polyps Neg Hx    Esophageal cancer Neg Hx    Stomach cancer Neg Hx    Rectal cancer Neg Hx     Social History   Tobacco Use   Smoking status: Never   Smokeless tobacco: Never  Vaping Use   Vaping Use: Never used  Substance Use Topics   Alcohol use: Yes    Comment: rare social; once socially    Drug use: No    ROS   Objective:   Vitals: BP 102/70   Pulse (!) 105   Temp 98.4 F (36.9 C) (Oral)   Resp 20   SpO2 100%   Physical Exam Constitutional:      General: She is not in acute distress.    Appearance: Normal appearance. She is well-developed. She is not ill-appearing, toxic-appearing or diaphoretic.  HENT:     Head: Normocephalic and atraumatic.     Nose: Nose normal.     Mouth/Throat:     Mouth: Mucous membranes are moist.     Pharynx: No pharyngeal swelling, oropharyngeal exudate, posterior oropharyngeal erythema or uvula swelling.     Tonsils: No tonsillar exudate or tonsillar abscesses. 0 on the right. 0 on the left.     Comments: Airway is patent, no facial or oral swelling.  Patient is speaking in full sentences and controlling secretions. Eyes:     General: No scleral icterus.       Right eye: No discharge.        Left eye: No discharge.     Extraocular Movements: Extraocular movements intact.  Cardiovascular:     Rate and Rhythm: Normal rate and regular rhythm.     Heart sounds: Normal heart sounds. No murmur heard.    No friction rub. No gallop.  Pulmonary:     Effort: Pulmonary effort is normal. No respiratory distress.     Breath sounds: No stridor. No wheezing, rhonchi or rales.  Chest:     Chest wall: No tenderness.  Skin:    General: Skin is warm and dry.     Findings: Rash (macular papular rash over dorsal aspect of hands, forearms up to the elbow and also the superiormost portion of her forehead) present.  Neurological:     General: No focal deficit present.     Mental Status: She is alert and oriented to person, place, and time.  Psychiatric:        Mood and Affect: Mood normal.        Behavior: Behavior normal.        Thought Content: Thought content normal.        Judgment: Judgment normal.     Assessment and Plan :   PDMP not reviewed this encounter.  1. Photodermatitis   2. Essential hypertension   3. Itching    Given  distribution, will manage with prednisone and hydroxyzine.  I have lower suspicion for medication reaction but advised that this is possible.  She is to follow-up with her PCP soon as possible.  Otherwise no signs of angioedema, anaphylactic reaction, erythema multiforme, Stevens-Johnson syndrome.  Advised that she maintain her medications for now.  Counseled patient on potential for adverse effects with medications prescribed/recommended today, ER and return-to-clinic precautions discussed, patient verbalized understanding.    Jaynee Eagles, PA-C 11/28/21 1100

## 2021-11-28 NOTE — ED Triage Notes (Signed)
Pt here with slightly itchy rash on both arms and scalp x 2 weeks.

## 2021-12-01 DIAGNOSIS — M2569 Stiffness of other specified joint, not elsewhere classified: Secondary | ICD-10-CM | POA: Diagnosis not present

## 2021-12-01 DIAGNOSIS — M62552 Muscle wasting and atrophy, not elsewhere classified, left thigh: Secondary | ICD-10-CM | POA: Diagnosis not present

## 2021-12-01 DIAGNOSIS — R269 Unspecified abnormalities of gait and mobility: Secondary | ICD-10-CM | POA: Diagnosis not present

## 2021-12-01 DIAGNOSIS — M5416 Radiculopathy, lumbar region: Secondary | ICD-10-CM | POA: Diagnosis not present

## 2021-12-22 DIAGNOSIS — E119 Type 2 diabetes mellitus without complications: Secondary | ICD-10-CM | POA: Diagnosis not present

## 2021-12-29 DIAGNOSIS — M5416 Radiculopathy, lumbar region: Secondary | ICD-10-CM | POA: Diagnosis not present

## 2021-12-29 DIAGNOSIS — R269 Unspecified abnormalities of gait and mobility: Secondary | ICD-10-CM | POA: Diagnosis not present

## 2021-12-29 DIAGNOSIS — M62552 Muscle wasting and atrophy, not elsewhere classified, left thigh: Secondary | ICD-10-CM | POA: Diagnosis not present

## 2021-12-29 DIAGNOSIS — M2569 Stiffness of other specified joint, not elsewhere classified: Secondary | ICD-10-CM | POA: Diagnosis not present

## 2022-01-06 DIAGNOSIS — M5416 Radiculopathy, lumbar region: Secondary | ICD-10-CM | POA: Diagnosis not present

## 2022-01-06 DIAGNOSIS — M2569 Stiffness of other specified joint, not elsewhere classified: Secondary | ICD-10-CM | POA: Diagnosis not present

## 2022-01-06 DIAGNOSIS — M62552 Muscle wasting and atrophy, not elsewhere classified, left thigh: Secondary | ICD-10-CM | POA: Diagnosis not present

## 2022-01-06 DIAGNOSIS — R269 Unspecified abnormalities of gait and mobility: Secondary | ICD-10-CM | POA: Diagnosis not present

## 2022-01-12 DIAGNOSIS — R269 Unspecified abnormalities of gait and mobility: Secondary | ICD-10-CM | POA: Diagnosis not present

## 2022-01-12 DIAGNOSIS — M62552 Muscle wasting and atrophy, not elsewhere classified, left thigh: Secondary | ICD-10-CM | POA: Diagnosis not present

## 2022-01-12 DIAGNOSIS — M2569 Stiffness of other specified joint, not elsewhere classified: Secondary | ICD-10-CM | POA: Diagnosis not present

## 2022-01-12 DIAGNOSIS — M5416 Radiculopathy, lumbar region: Secondary | ICD-10-CM | POA: Diagnosis not present

## 2022-01-16 DIAGNOSIS — R269 Unspecified abnormalities of gait and mobility: Secondary | ICD-10-CM | POA: Diagnosis not present

## 2022-01-16 DIAGNOSIS — M2569 Stiffness of other specified joint, not elsewhere classified: Secondary | ICD-10-CM | POA: Diagnosis not present

## 2022-01-16 DIAGNOSIS — M62552 Muscle wasting and atrophy, not elsewhere classified, left thigh: Secondary | ICD-10-CM | POA: Diagnosis not present

## 2022-01-16 DIAGNOSIS — M5416 Radiculopathy, lumbar region: Secondary | ICD-10-CM | POA: Diagnosis not present

## 2022-01-23 DIAGNOSIS — M2569 Stiffness of other specified joint, not elsewhere classified: Secondary | ICD-10-CM | POA: Diagnosis not present

## 2022-01-23 DIAGNOSIS — M5416 Radiculopathy, lumbar region: Secondary | ICD-10-CM | POA: Diagnosis not present

## 2022-01-23 DIAGNOSIS — R269 Unspecified abnormalities of gait and mobility: Secondary | ICD-10-CM | POA: Diagnosis not present

## 2022-01-23 DIAGNOSIS — M62552 Muscle wasting and atrophy, not elsewhere classified, left thigh: Secondary | ICD-10-CM | POA: Diagnosis not present

## 2022-02-01 DIAGNOSIS — M2569 Stiffness of other specified joint, not elsewhere classified: Secondary | ICD-10-CM | POA: Diagnosis not present

## 2022-02-01 DIAGNOSIS — M62552 Muscle wasting and atrophy, not elsewhere classified, left thigh: Secondary | ICD-10-CM | POA: Diagnosis not present

## 2022-02-01 DIAGNOSIS — M5416 Radiculopathy, lumbar region: Secondary | ICD-10-CM | POA: Diagnosis not present

## 2022-02-01 DIAGNOSIS — R269 Unspecified abnormalities of gait and mobility: Secondary | ICD-10-CM | POA: Diagnosis not present

## 2022-03-24 DIAGNOSIS — E119 Type 2 diabetes mellitus without complications: Secondary | ICD-10-CM | POA: Diagnosis not present

## 2022-04-18 DIAGNOSIS — E1165 Type 2 diabetes mellitus with hyperglycemia: Secondary | ICD-10-CM | POA: Diagnosis not present

## 2022-04-18 DIAGNOSIS — Z0000 Encounter for general adult medical examination without abnormal findings: Secondary | ICD-10-CM | POA: Diagnosis not present

## 2022-04-18 DIAGNOSIS — D509 Iron deficiency anemia, unspecified: Secondary | ICD-10-CM | POA: Diagnosis not present

## 2022-04-18 DIAGNOSIS — F325 Major depressive disorder, single episode, in full remission: Secondary | ICD-10-CM | POA: Diagnosis not present

## 2022-04-18 DIAGNOSIS — I10 Essential (primary) hypertension: Secondary | ICD-10-CM | POA: Diagnosis not present

## 2022-08-28 HISTORY — PX: ABDOMINAL HYSTERECTOMY: SHX81

## 2022-08-29 DIAGNOSIS — N920 Excessive and frequent menstruation with regular cycle: Secondary | ICD-10-CM | POA: Diagnosis not present

## 2022-08-29 DIAGNOSIS — D219 Benign neoplasm of connective and other soft tissue, unspecified: Secondary | ICD-10-CM | POA: Diagnosis not present

## 2022-08-29 DIAGNOSIS — R102 Pelvic and perineal pain: Secondary | ICD-10-CM | POA: Diagnosis not present

## 2022-08-29 DIAGNOSIS — E1165 Type 2 diabetes mellitus with hyperglycemia: Secondary | ICD-10-CM | POA: Diagnosis not present

## 2022-09-04 NOTE — Pre-Procedure Instructions (Signed)
Surgical Instructions    Your procedure is scheduled on Wednesday 09/13/22.   Report to [facility] Main Entrance "A" at 06:30 A.M., then check in with the Admitting office.  Call this number if you have problems the morning of surgery:  [phone]   If you have any questions prior to your surgery date call [phone]: Open Monday-Friday 8am-4pm If you experience any cold or flu symptoms such as cough, fever, chills, shortness of breath, etc. between now and your scheduled surgery, please notify us at the above number     Remember:  Do not eat after midnight the night before your surgery  You may drink clear liquids until 05:30 A.M. the morning of your surgery.   Clear liquids allowed are: Water, Non-Citrus Juices (without pulp), Carbonated Beverages, Clear Tea, Black Coffee ONLY (NO MILK, CREAM OR POWDERED CREAMER of any kind), and Gatorade    Take these medicines the morning of surgery with A SIP OF WATER:    acetaminophen (TYLENOL)- If needed  As of today, STOP taking any Aspirin (unless otherwise instructed by your surgeon) Aleve, Naproxen, Ibuprofen, Motrin, Advil, Goody's, BC's, all herbal medications, fish oil, and all vitamins.  WHAT DO I DO ABOUT MY DIABETES MEDICATION?   Do not take oral diabetes medicines (pills) the morning of surgery.  DO NOT TAKE metFORMIN (GLUCOPHAGE-XR) the morning of surgery.  DO NOT TAKE Semaglutide, 7 days prior to surgery. Last dose should be taken on 09/03/22.  The day of surgery, do not take other diabetes injectables, including Byetta (exenatide), Bydureon (exenatide ER), Victoza (liraglutide), or Trulicity (dulaglutide).   HOW TO MANAGE YOUR DIABETES BEFORE AND AFTER SURGERY  Why is it important to control my blood sugar before and after surgery? Improving blood sugar levels before and after surgery helps healing and can limit problems. A way of improving blood sugar control is eating a healthy diet by:  Eating less sugar and  carbohydrates  Increasing activity/exercise  Talking with your doctor about reaching your blood sugar goals High blood sugars (greater than 180 mg/dL) can raise your risk of infections and slow your recovery, so you will need to focus on controlling your diabetes during the weeks before surgery. Make sure that the doctor who takes care of your diabetes knows about your planned surgery including the date and location.  How do I manage my blood sugar before surgery? Check your blood sugar at least 4 times a day, starting 2 days before surgery, to make sure that the level is not too high or low.  Check your blood sugar the morning of your surgery when you wake up and every 2 hours until you get to the Short Stay unit.  If your blood sugar is less than 70 mg/dL, you will need to treat for low blood sugar: Do not take insulin. Treat a low blood sugar (less than 70 mg/dL) with  cup of clear juice (cranberry or apple), 4 glucose tablets, OR glucose gel. Recheck blood sugar in 15 minutes after treatment (to make sure it is greater than 70 mg/dL). If your blood sugar is not greater than 70 mg/dL on recheck, call [phone] for further instructions. Report your blood sugar to the short stay nurse when you get to Short Stay.  If you are admitted to the hospital after surgery: Your blood sugar will be checked by the staff and you will probably be given insulin after surgery (instead of oral diabetes medicines) to make sure you have good blood sugar levels.  The goal for blood sugar control after surgery is 80-180 mg/dL.            Do not wear jewelry or makeup. Do not wear lotions, powders, perfumes/cologne or deodorant. Do not shave 48 hours prior to surgery.  Men may shave face and neck. Do not bring valuables to the hospital. Do not wear nail polish, gel polish, artificial nails, or any other type of covering on natural nails (fingers and toes) If you have artificial nails or gel coating that  need to be removed by a nail salon, please have this removed prior to surgery. Artificial nails or gel coating may interfere with anesthesia's ability to adequately monitor your vital signs.  [facility] is not responsible for any belongings or valuables.    Do NOT Smoke (Tobacco/Vaping)  24 hours prior to your procedure  If you use a CPAP at night, you may bring your mask for your overnight stay.   Contacts, glasses, hearing aids, dentures or partials may not be worn into surgery, please bring cases for these belongings   For patients admitted to the hospital, discharge time will be determined by your treatment team.   Patients discharged the day of surgery will not be allowed to drive home, and someone needs to stay with them for 24 hours.   SURGICAL WAITING ROOM VISITATION Patients having surgery or a procedure may have no more than 2 support people in the waiting area - these visitors may rotate.   Children under the age of 10516 must have an adult with them who is not the patient. If the patient needs to stay at the hospital during part of their recovery, the visitor guidelines for inpatient rooms apply. Pre-op nurse will coordinate an appropriate time for 1 support person to accompany patient in pre-op.  This support person may not rotate.   Please refer to [email or web address] for the visitor guidelines for Inpatients (after your surgery is over and you are in a regular room).    Special instructions:    Oral Hygiene is also important to reduce your risk of infection.  Remember - BRUSH YOUR TEETH THE MORNING OF SURGERY WITH YOUR REGULAR TOOTHPASTE   [place]- Preparing For Surgery  Before surgery, you can play an important role. Because skin is not sterile, your skin needs to be as free of germs as possible. You can reduce the number of germs on your skin by washing with CHG (chlorahexidine gluconate) Soap before surgery.  CHG is  an antiseptic cleaner which kills germs and bonds with the skin to continue killing germs even after washing.     Please do not use if you have an allergy to CHG or antibacterial soaps. If your skin becomes reddened/irritated stop using the CHG.  Do not shave (including legs and underarms) for at least 48 hours prior to first CHG shower. It is OK to shave your face.  Please follow these instructions carefully.     Shower the NIGHT BEFORE SURGERY and the MORNING OF SURGERY with CHG Soap.   If you chose to wash your hair, wash your hair first as usual with your normal shampoo. After you shampoo, rinse your hair and body thoroughly to remove the shampoo.  Then [employer]ucor CorporationWash Face and genitals (private parts) with your normal soap and rinse thoroughly to remove soap.  After that Use CHG Soap as you would any other liquid soap. You can apply CHG directly to the skin and wash gently with a scrungie or  a clean washcloth.   Apply the CHG Soap to your body ONLY FROM THE NECK DOWN.  Do not use on open wounds or open sores. Avoid contact with your eyes, ears, mouth and genitals (private parts). Wash Face and genitals (private parts)  with your normal soap.   Wash thoroughly, paying special attention to the area where your surgery will be performed.  Thoroughly rinse your body with warm water from the neck down.  DO NOT shower/wash with your normal soap after using and rinsing off the CHG Soap.  Pat yourself dry with a CLEAN TOWEL.  Wear CLEAN PAJAMAS to bed the night before surgery  Place CLEAN SHEETS on your bed the night before your surgery  DO NOT SLEEP WITH PETS.   Day of Surgery:  Take a shower with CHG soap. Wear Clean/Comfortable clothing the morning of surgery Do not apply any deodorants/lotions.   Remember to brush your teeth WITH YOUR REGULAR TOOTHPASTE.    If you received a COVID test during your pre-op visit, it is requested that you wear a mask when out in public, stay away from  anyone that may not be feeling well, and notify your surgeon if you develop symptoms. If you have been in contact with anyone that has tested positive in the last 10 days, please notify your surgeon.    Please read over the following fact sheets that you were given.

## 2022-09-05 ENCOUNTER — Encounter (HOSPITAL_COMMUNITY)
Admission: RE | Admit: 2022-09-05 | Discharge: 2022-09-05 | Disposition: A | Discharge status: Home / Self Care | Payer: BC Managed Care – PPO | Source: Ambulatory Visit | Attending: Obstetrics and Gynecology | Admitting: Obstetrics and Gynecology

## 2022-09-05 ENCOUNTER — Other Ambulatory Visit: Payer: Self-pay

## 2022-09-05 ENCOUNTER — Encounter (HOSPITAL_COMMUNITY): Payer: Self-pay

## 2022-09-05 VITALS — BP 104/65 | HR 102 | Temp 98.5°F | Resp 18 | Ht 64.0 in | Wt 149.9 lb

## 2022-09-05 DIAGNOSIS — Z01818 Encounter for other preprocedural examination: Secondary | ICD-10-CM

## 2022-09-05 DIAGNOSIS — E118 Type 2 diabetes mellitus with unspecified complications: Secondary | ICD-10-CM | POA: Diagnosis not present

## 2022-09-05 DIAGNOSIS — R000 Tachycardia, unspecified: Secondary | ICD-10-CM | POA: Insufficient documentation

## 2022-09-05 DIAGNOSIS — I444 Left anterior fascicular block: Secondary | ICD-10-CM | POA: Diagnosis not present

## 2022-09-05 HISTORY — DX: Essential (primary) hypertension: I10

## 2022-09-05 LAB — BASIC METABOLIC PANEL
Anion gap: 12 (ref 5–15)
BUN: 28 mg/dL — ABNORMAL HIGH (ref 6–20)
CO2: 24 mmol/L (ref 22–32)
Calcium: 9.3 mg/dL (ref 8.9–10.3)
Chloride: 101 mmol/L (ref 98–111)
Creatinine, Ser: 1.27 mg/dL — ABNORMAL HIGH (ref 0.44–1.00)
GFR, Estimated: 52 mL/min — ABNORMAL LOW (ref >60)
Glucose, Bld: 112 mg/dL — ABNORMAL HIGH (ref 70–99)
Potassium: 4.1 mmol/L (ref 3.5–5.1)
Sodium: 137 mmol/L (ref 135–145)

## 2022-09-05 LAB — TYPE AND SCREEN
ABO/RH(D): AB POS
Antibody Screen: NEGATIVE

## 2022-09-05 LAB — HEMOGLOBIN A1C
Hgb A1c MFr Bld: 6.3 % — ABNORMAL HIGH (ref 4.8–5.6)
Mean Plasma Glucose: 134.11 mg/dL

## 2022-09-05 LAB — CBC
HCT: 34.7 % — ABNORMAL LOW (ref 36.0–46.0)
Hemoglobin: 11.5 g/dL — ABNORMAL LOW (ref 12.0–15.0)
MCH: 28.2 pg (ref 26.0–34.0)
MCHC: 33.1 g/dL (ref 30.0–36.0)
MCV: 85 fL (ref 80.0–100.0)
Platelets: 421 10*3/uL — ABNORMAL HIGH (ref 150–400)
RBC: 4.08 MIL/uL (ref 3.87–5.11)
RDW: 14.3 % (ref 11.5–15.5)
WBC: 18.6 10*3/uL — ABNORMAL HIGH (ref 4.0–10.5)
nRBC: 0 % (ref 0.0–0.2)

## 2022-09-05 LAB — GLUCOSE, CAPILLARY: Glucose-Capillary: 101 mg/dL — ABNORMAL HIGH (ref 70–99)

## 2022-09-05 NOTE — Progress Notes (Signed)
Notified Dr. Richardson Dopp of abnormal WBC.

## 2022-09-05 NOTE — Progress Notes (Signed)
PCP - Deatra James Cardiologist - denies  PPM/ICD - denies  Chest x-ray - n/a EKG - 09/05/22 Stress Test - denies ECHO - denies Cardiac Cath - denies  Sleep Study - denies   Fasting Blood Sugar - 90s Checks Blood Sugar multiple times a day- has a continuous glucose monitor on left upper arm  Last dose of GLP1 agonist-  Ozempic GLP1 instructions: stop one week prior to surgery   ERAS Protcol -yes PRE-SURGERY Ensure or G2- none ordered  COVID TEST- not needed   Anesthesia review: no  Patient denies shortness of breath, fever, cough and chest pain at PAT appointment   All instructions explained to the patient, with a verbal understanding of the material. Patient agrees to go over the instructions while at home for a better understanding. Patient also instructed to self quarantine after being tested for COVID-19. The opportunity to ask questions was provided.

## 2022-09-11 ENCOUNTER — Other Ambulatory Visit: Payer: Self-pay | Admitting: Obstetrics and Gynecology

## 2022-09-11 DIAGNOSIS — D219 Benign neoplasm of connective and other soft tissue, unspecified: Secondary | ICD-10-CM

## 2022-09-11 NOTE — Anesthesia Preprocedure Evaluation (Signed)
Anesthesia Evaluation  Patient identified by MRN, date of birth, ID band Patient awake    Reviewed: Allergy & Precautions, H&P , NPO status , Patient's Chart, lab work & pertinent test results  Airway Mallampati: II   Neck ROM: full    Dental   Pulmonary neg pulmonary ROS   breath sounds clear to auscultation       Cardiovascular hypertension,  Rhythm:regular Rate:Normal     Neuro/Psych  Headaches PSYCHIATRIC DISORDERS  Depression     Neuromuscular disease    GI/Hepatic   Endo/Other  diabetes, Type 2    Renal/GU      Musculoskeletal   Abdominal   Peds  Hematology   Anesthesia Other Findings   Reproductive/Obstetrics Fibroid uterus                             Anesthesia Physical Anesthesia Plan  ASA: 2  Anesthesia Plan: General   Post-op Pain Management: Regional block*   Induction: Intravenous  PONV Risk Score and Plan: 3 and Ondansetron, Dexamethasone, Midazolam and Treatment may vary due to age or medical condition  Airway Management Planned: Oral ETT  Additional Equipment:   Intra-op Plan:   Post-operative Plan: Extubation in OR  Informed Consent: I have reviewed the patients History and Physical, chart, labs and discussed the procedure including the risks, benefits and alternatives for the proposed anesthesia with the patient or authorized representative who has indicated his/her understanding and acceptance.     Dental advisory given  Plan Discussed with: CRNA, Anesthesiologist and Surgeon  Anesthesia Plan Comments: (Dr. Richardson Dopp requests bilateral TAP block)       Anesthesia Quick Evaluation

## 2022-09-11 NOTE — H&P (Signed)
Reason for Appointment 1. Preop Visit/A1C recheck per Dr.Carmie Lanpher History of Present Illness General:         52 y/o presents for preop visit. Pt is schdule for a total abdominal hysterectomy with bilateral salpingectomy on 09/13/2022 for the management of fibroids menorrhagia and dysmenorrhea. .  --- IN REVIEW: TVUS performed 11/01/2021 for further evaluation of pelvic mass incidental finding on lumbar MRI. TVUS notable for uterus measuring 15.7 x 8.4 x 12.7 cm heterogenous in appearance. Exophytic anterior wall leiomyoma upper left uterus 6.2 cm. Large central to posterior transmural leiomyoma upper to mid uterus measuring 10.7 cm. Endometrium 5 mm without fluid or mass. Bilateral ovaries normal. To note, TVUS 02/08/2021 notable for uterus measuring 10.64 x 8.24 x 9.62 cm with 6 uterine fibroids: Posterior pedunculated 8.4 cm, left pedunculated 4.4 cm, anterior submucosal 2.2 cm, anterior 2.1 cm, posterior midbody submucosal 2.6 cm, and right fundal pedunculated 5.7 cm. Patient c/o low back and pelvic pain since 05/2021. She reports left leg numbness that is intermittent. Has been evaluated by Dr. Lyn Hollingshead. No increased urinary frequency or incontinence. Reports LMP heavy and painful. Reports regular monthly menses without intermenstrual bleeding, last 7-8 days with 2-3 heavy days, changing pad up to 3-4 times on heaviest days. She is interested in surgical management only.  11/21/2021. pt reports pain during her menses. last menses was 11/05/2021 lasting 10 days. she canges a pad 3 times on her heaviest day. she had a very heavy menses in April 2023 lasting 10 days. changing a pad 4 times on her heaviest day. she denies intermenstrual bleeding.  she rates pain 9 out of 10 during her menses. she has taken ibuprofen without relief. Her menses have been painful for the last 12 months.  Current Medications Taking Effexor XR 75 MG Capsule Extended Release 24 Hour 3 capsule with food Orally Once a  day Lisinopril-hydroCHLOROthiazide 20-12.5 MG Tablet 2 tablets Orally Once a day metFORMIN HCl ER 500 MG Tablet Extended Release 24 Hour 2 tablets Orally twice a day Ozempic (1 MG/DOSE)(Semaglutide (1 MG/DOSE)) 4 MG/3ML Solution Pen-injector 1 mg Subcutaneous once a week Relpax(Eletriptan Hydrobromide) 40 MG Tablet 1 tablet as needed one time by mouth once at the start of the migraine, may repeat x 1 in two hours if the headache returns as needed Simvastatin 5 MG Tablet 1 tablet in the evening Orally Once a day FreeStyle Libre 2 Sensor - Miscellaneous as directed Medication List reviewed and reconciled with the patient Past Medical History      Depression.      Hypertension.      Diabetes mellitus.      Hodgkins lymphoma s/p chemo and in remission since 1997.      Fibroids.      Migraine headaches.      stage III B adenocarcinoma of the colon (12/2015).      Mild aortic atherosclerosis seen on imaging (2019). Surgical History       jaw surgery 1988       lymph node biopsy 1996       D&C with myomectomy 2005       left hemicolectomy for malignant obstructing mass (adenocarcinoma)with end colostomy 12/2015       colostomy takedown 12/17 Family History Father: alive 30 yrs, Prostate cancer Mother: deceased, diabetes, hypertension, hypercholesterolemia, Lewy body disease Paternal Grand Father: deceased, COPD Paternal Grand Mother: deceased, died from a blood clot Maternal Grand Father: deceased, diabetes Maternal Grand Mother: deceased, hypercholesterolemia, hypertension, diabetes Sister 1: alive 45  yrs, hypercholesterolemia, diabetes, hypertension, Sister 2: alive 50 yrs, Hypertension denies any GYN family cancer hx, No Family History of Colon Cancer,. Social History General:  Tobacco use  cigarettes:  Never smoked, Tobacco history last updated  08/29/2022, Vaping  No. EXPOSURE TO PASSIVE SMOKE: no. Alcohol: rare: beer, wine, liquor. Recreational drug use: no. Exercise: 3 times per  week: walking. Marital Status: single. Children: none. 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 Religion: none. Seat belt use: yes. Gyn History Sexual activity not currently sexually active.  Periods : every month.  LMP 08/29/2022.  Denies H/O Birth control.  Last pap smear date 02/2020.  Last mammogram date 07/2021- BI-RADS 1: neg..  Denies H/O Abnormal pap smear.  Denies H/O STD.  OB History Never been pregnant  per patient.  Allergies Sulfa drugs (for allergy): rash Rybelsus: nausea above 3 mg Hospitalization/Major Diagnostic Procedure large bowel obstruction 12/2015 Review of Systems CONSTITUTIONAL:         Chills No.  Fatigue No.  Fever No.  Night sweats No.  Recent travel outside [place] No.  Sweats No.  Weight change No.     OPHTHALMOLOGY:         Blurring of vision no.  Change in vision no.  Double vision no.     ENT:         Dizziness no.  Nose bleeds no.  Sore throat no.  Teeth pain no.     ALLERGY:         Hives no.     CARDIOLOGY:         Chest pain no.  High blood pressure no.  Irregular heart beat no.  Leg edema no.  Palpitations no.     RESPIRATORY:         Shortness of breath no.  Cough no.  Wheezing no.     UROLOGY:         Pain with urination no.  Urinary urgency no.  Urinary frequency no.  Urinary incontinence no.  Difficulty urinating No.  Blood in urine No.     GASTROENTEROLOGY:         Abdominal pain no.  Appetite change no.  Bloating/belching no.  Blood in stool or on toilet paper no.  Change in bowel movements no.  Constipation no.  Diarrhea no.  Difficulty swallowing no.  Nausea no.     FEMALE REPRODUCTIVE:         Vulvar pain no.  Vulvar rash no.  Abnormal vaginal bleeding , heavy menses.  Breast pain no.  Nipple discharge no.  Pain with intercourse no.  Pelvic pain , yes.  Unusual vaginal discharge no.  Vaginal itching no.     MUSCULOSKELETAL:         Muscle aches no.     NEUROLOGY:         Headache no.  Tingling/numbness no.  Weakness no.      PSYCHOLOGY:         Depression no.  Anxiety no.  Nervousness no.  Sleep disturbances no.  Suicidal ideation no .     ENDOCRINOLOGY:         Excessive thirst no.  Excessive urination no.  Hair loss no.  Heat or cold intolerance no.     HEMATOLOGY/LYMPH:         Abnormal bleeding no.  Easy bruising no.  Swollen glands no.     DERMATOLOGY:         New/changing skin lesion no.  Rash no.  Sores no.   Vital Signs Wt: 150.8, Wt change: -6.4 lbs, Ht: 65, BMI: 25.09, Pulse sitting: 101, BP sitting: 103/68. Examination General Examination:        CONSTITUTIONAL: alert, oriented, NAD. SKIN:  moist, warm. EYES:  Conjunctiva clear. LUNGS: good I:E efffort noted, clear to auscultation bilaterally. HEART: regular rate and rhythm. ABDOMEN: soft, non-tender/non-distended, bowel sounds present. FEMALE GENITOURINARY: normal external genitalia, labia - unremarkable, vagina - pink moist mucosa, no lesions or abnormal discharge, cervix - no discharge or lesions or CMT, adnexa - no masses or tenderness, uterus - nontender and14-16 week size  on palpation. EXTREMITIES: no edema present. PSYCH:  affect normal, good eye contact.  Physical Examination Chaperone present:         Chaperone present  Debby Freiberg 08/29/2022 02:44:02 PM >, for pelvic exam.      Assessments 1. Fibroids - D21.9 (Primary) 2. Pelvic pain - R10.2 3. Menorrhagia with regular cycle - N92.0 4. Type 2 diabetes mellitus with hyperglycemia - E11.65 Treatment 1. Fibroids  Notes: pt is interested in definitive therapy r/b/a of hysterectomy discussed including but not limited to infection , bleeding damage to bowel bladder ureters and surrouding organs with the need for further surgery . pt voiced understanding. she has a h/o partial colectomy .. given her past surgical history and size of her uterus if hysterectomy is performed it would need to be a total abdominal hysterectomy with bilateral salpingectomy. She is advised if adhesions are too dense at  the time of surgery the procedure may be aborted to avoid injury. d/w her r/o transfusion including transfusion reaction/ HIV/ hep B&C . she voiced understanding and desries to proceed wtih TAH/BS . she is agreeable to transfusion if it is deemed necessary at the time of surgery as a life saving measure. she is advised to avoid eating or drinking after midnight the night prior to surgery. avoid NSAIDS 10 day prior to surgery.Marland Kitchen avoid ozempic 1 week prior to surgery. last dose will be April 7th as ths takes the medication on Sundays'... she is advised she will not be able to drive for 2 weeks after surgery. she will be required to avoid lifting over 10 lbs and sex for at least 6 weeks post surgery.     2. Pelvic pain  Notes: pt is interested in definitive therapy r/b/a of hysterectomy discussed including but not limited to infection , bleeding damage to bowel bladder ureters and surrouding organs with the need for further surgery . pt voiced understanding. she has a h/o partial colectomy .. given her past surgical history and size of her uterus if hysterectomy is performed it would need to be a total abdominal hysterectomy with bilateral salpingectomy. She is advised if adhesions are too dense at the time of surgery the procedure may be aborted to avoid injury. d/w her r/o transfusion including transfusion reaction/ HIV/ hep B&C . she voiced understanding and desries to proceed wtih TAH/BS . she is agreeable to transfusion if it is deemed necessary at the time of surgery as a life saving measure. she is advised to avoid eating or drinking after midnight the night prior to surgery. avoid NSAIDS 10 day prior to surgery.Marland Kitchen avoid ozempic 1 week prior to surgery. last dose will be April 7th as ths takes the medication on Sundays'... she is advised she will not be able to drive for 2 weeks after surgery. she will be required to avoid lifting over 10 lbs and sex for at least 6 weeks  post surgery.     3. Menorrhagia with  regular cycle  Notes: pt is interested in definitive therapy r/b/a of hysterectomy discussed including but not limited to infection , bleeding damage to bowel bladder ureters and surrouding organs with the need for further surgery . pt voiced understanding. she has a h/o partial colectomy .. given her past surgical history and size of her uterus if hysterectomy is performed it would need to be a total abdominal hysterectomy with bilateral salpingectomy. She is advised if adhesions are too dense at the time of surgery the procedure may be aborted to avoid injury. d/w her r/o transfusion including transfusion reaction/ HIV/ hep B&C . she voiced understanding and desries to proceed wtih TAH/BS . she is agreeable to transfusion if it is deemed necessary at the time of surgery as a life saving measure. she is advised to avoid eating or drinking after midnight the night prior to surgery. avoid NSAIDS 10 day prior to surgery.Marland Kitchen avoid ozempic 1 week prior to surgery. last dose will be April 7th as ths takes the medication on Sundays'... she is advised she will not be able to drive for 2 weeks after surgery. she will be required to avoid lifting over 10 lbs and sex for at least 6 weeks post surgery.     4. Type 2 diabetes mellitus with hyperglycemia        LAB: Hemoglobin A1c (Collection Date & Time - 08/29/2022 02:53 PM) Notes: pt is interested in definitive therapy r/b/a of hysterectomy discussed including but not limited to infection , bleeding damage to bowel bladder ureters and surrouding organs with the need for further surgery . pt voiced understanding. she has a h/o partial colectomy .. given her past surgical history and size of her uterus if hysterectomy is performed it would need to be a total abdominal hysterectomy with bilateral salpingectomy. She is advised if adhesions are too dense at the time of surgery the procedure may be aborted to avoid injury. d/w her r/o transfusion including transfusion reaction/  HIV/ hep B&C . she voiced understanding and desries to proceed wtih TAH/BS . she is agreeable to transfusion if it is deemed necessary at the time of surgery as a life saving measure. she is advised to avoid eating or drinking after midnight the night prior to surgery. avoid NSAIDS 10 day prior to surgery.Marland Kitchen avoid ozempic 1 week prior to surgery. last dose will be April 7th as ths takes the medication on Sundays'... she is advised she will not be able to drive for 2 weeks after surgery. she will be required to avoid lifting over 10 lbs and sex for at least 6 weeks post surgery.    Procedures Venipuncture:         Venipuncture:  Reynolds, Kennith Center 08/29/2022 02:54:17 PM >, performed in right arm.  Labs         Lab: Hemoglobin A1c (Collection Date & Time - 08/29/2022 02:53 PM)                     Value Reference Range      HGB A1C 6.7 H 4.8-5.6 - %      Estimated Ave Glucose 146  - mg/dL Dynasty Holquin 16/02/9603 54:09:81 PM > decreasing from previous value . Great news.. Sent to Dr. Wynelle Link for recommendations regarding SUN, Charise Carwin 08/30/2022 02:23:14 PM > Excellent response to the Ozempic. Please advise patient, with this good level of control, I am fine with her missing the dose  on 09/10/22 for surgery and just resuming the Ozempic after surgery (4/17) This lab was reviewed by Ellin Goodie on 08/31/2022 at 09:57 AM EDT

## 2022-09-11 NOTE — H&P (Deleted)
  The note originally documented on this encounter has been moved the the encounter in which it belongs.  

## 2022-09-13 ENCOUNTER — Encounter (HOSPITAL_COMMUNITY): Payer: Self-pay | Admitting: Obstetrics and Gynecology

## 2022-09-13 ENCOUNTER — Inpatient Hospital Stay (HOSPITAL_COMMUNITY): Payer: BC Managed Care – PPO | Admitting: Anesthesiology

## 2022-09-13 ENCOUNTER — Other Ambulatory Visit: Payer: Self-pay

## 2022-09-13 ENCOUNTER — Inpatient Hospital Stay (HOSPITAL_COMMUNITY)
Admission: AD | Admit: 2022-09-13 | Discharge: 2022-09-15 | DRG: 742 | Disposition: A | Discharge status: Home / Self Care | Payer: BC Managed Care – PPO | Attending: Obstetrics and Gynecology | Admitting: Obstetrics and Gynecology

## 2022-09-13 ENCOUNTER — Encounter (HOSPITAL_COMMUNITY)
Admission: AD | Disposition: A | Discharge status: Home / Self Care | Payer: Self-pay | Source: Home / Self Care | Attending: Obstetrics and Gynecology

## 2022-09-13 DIAGNOSIS — I700 Atherosclerosis of aorta: Secondary | ICD-10-CM | POA: Diagnosis not present

## 2022-09-13 DIAGNOSIS — Z825 Family history of asthma and other chronic lower respiratory diseases: Secondary | ICD-10-CM | POA: Diagnosis not present

## 2022-09-13 DIAGNOSIS — N920 Excessive and frequent menstruation with regular cycle: Secondary | ICD-10-CM | POA: Diagnosis present

## 2022-09-13 DIAGNOSIS — I10 Essential (primary) hypertension: Secondary | ICD-10-CM | POA: Diagnosis present

## 2022-09-13 DIAGNOSIS — N840 Polyp of corpus uteri: Secondary | ICD-10-CM | POA: Diagnosis not present

## 2022-09-13 DIAGNOSIS — Z90710 Acquired absence of both cervix and uterus: Secondary | ICD-10-CM | POA: Diagnosis present

## 2022-09-13 DIAGNOSIS — E1165 Type 2 diabetes mellitus with hyperglycemia: Secondary | ICD-10-CM | POA: Diagnosis present

## 2022-09-13 DIAGNOSIS — D251 Intramural leiomyoma of uterus: Secondary | ICD-10-CM | POA: Diagnosis not present

## 2022-09-13 DIAGNOSIS — Z8249 Family history of ischemic heart disease and other diseases of the circulatory system: Secondary | ICD-10-CM

## 2022-09-13 DIAGNOSIS — D259 Leiomyoma of uterus, unspecified: Secondary | ICD-10-CM | POA: Diagnosis not present

## 2022-09-13 DIAGNOSIS — Z833 Family history of diabetes mellitus: Secondary | ICD-10-CM

## 2022-09-13 DIAGNOSIS — F32A Depression, unspecified: Secondary | ICD-10-CM | POA: Diagnosis not present

## 2022-09-13 DIAGNOSIS — D62 Acute posthemorrhagic anemia: Secondary | ICD-10-CM | POA: Diagnosis present

## 2022-09-13 DIAGNOSIS — Z9221 Personal history of antineoplastic chemotherapy: Secondary | ICD-10-CM

## 2022-09-13 DIAGNOSIS — D509 Iron deficiency anemia, unspecified: Secondary | ICD-10-CM | POA: Diagnosis not present

## 2022-09-13 DIAGNOSIS — Z7984 Long term (current) use of oral hypoglycemic drugs: Secondary | ICD-10-CM | POA: Diagnosis not present

## 2022-09-13 DIAGNOSIS — D219 Benign neoplasm of connective and other soft tissue, unspecified: Principal | ICD-10-CM | POA: Diagnosis present

## 2022-09-13 DIAGNOSIS — Z79899 Other long term (current) drug therapy: Secondary | ICD-10-CM

## 2022-09-13 DIAGNOSIS — Z85038 Personal history of other malignant neoplasm of large intestine: Secondary | ICD-10-CM

## 2022-09-13 DIAGNOSIS — G8918 Other acute postprocedural pain: Secondary | ICD-10-CM | POA: Diagnosis not present

## 2022-09-13 DIAGNOSIS — E118 Type 2 diabetes mellitus with unspecified complications: Secondary | ICD-10-CM

## 2022-09-13 DIAGNOSIS — N8500 Endometrial hyperplasia, unspecified: Secondary | ICD-10-CM | POA: Diagnosis not present

## 2022-09-13 DIAGNOSIS — Z882 Allergy status to sulfonamides status: Secondary | ICD-10-CM

## 2022-09-13 DIAGNOSIS — N870 Mild cervical dysplasia: Secondary | ICD-10-CM | POA: Diagnosis not present

## 2022-09-13 DIAGNOSIS — Z8571 Personal history of Hodgkin lymphoma: Secondary | ICD-10-CM

## 2022-09-13 DIAGNOSIS — Z01818 Encounter for other preprocedural examination: Secondary | ICD-10-CM

## 2022-09-13 HISTORY — PX: HYSTERECTOMY ABDOMINAL WITH SALPINGECTOMY: SHX6725

## 2022-09-13 LAB — GLUCOSE, CAPILLARY
Glucose-Capillary: 115 mg/dL — ABNORMAL HIGH (ref 70–99)
Glucose-Capillary: 143 mg/dL — ABNORMAL HIGH (ref 70–99)
Glucose-Capillary: 121 mg/dL — ABNORMAL HIGH (ref 70–99)
Glucose-Capillary: 138 mg/dL — ABNORMAL HIGH (ref 70–99)

## 2022-09-13 LAB — HCG, SERUM, QUALITATIVE: Preg, Serum: NEGATIVE

## 2022-09-13 SURGERY — HYSTERECTOMY, TOTAL, ABDOMINAL, WITH SALPINGECTOMY
Anesthesia: General | Laterality: Bilateral

## 2022-09-13 MED ORDER — ONDANSETRON HCL 4 MG/2ML IJ SOLN
INTRAMUSCULAR | Status: DC | PRN
  Administered 2022-09-13: 4 mg via INTRAVENOUS

## 2022-09-13 MED ORDER — SIMETHICONE 80 MG PO CHEW: 80.0000 mg | CHEWABLE_TABLET | Freq: Four times a day (QID) | ORAL | Status: DC | PRN

## 2022-09-13 MED ORDER — FENTANYL CITRATE (PF) 250 MCG/5ML IJ SOLN
INTRAMUSCULAR | Status: AC
  Filled 2022-09-13: qty 5

## 2022-09-13 MED ORDER — POVIDONE-IODINE 10 % EX SWAB
2.0000 | Freq: Once | CUTANEOUS | Status: AC
  Administered 2022-09-13: 2 via TOPICAL

## 2022-09-13 MED ORDER — ROCURONIUM BROMIDE 10 MG/ML (PF) SYRINGE
PREFILLED_SYRINGE | INTRAVENOUS | Status: AC
  Filled 2022-09-13: qty 10

## 2022-09-13 MED ORDER — FENTANYL CITRATE (PF) 100 MCG/2ML IJ SOLN
25.0000 ug | INTRAMUSCULAR | Status: DC | PRN
  Administered 2022-09-13 (×3): 50 ug via INTRAVENOUS

## 2022-09-13 MED ORDER — LISINOPRIL-HYDROCHLOROTHIAZIDE 20-12.5 MG PO TABS: 2.0000 | ORAL_TABLET | Freq: Every day | ORAL | Status: DC

## 2022-09-13 MED ORDER — IBUPROFEN 600 MG PO TABS
600.0000 mg | ORAL_TABLET | Freq: Four times a day (QID) | ORAL | Status: DC
  Administered 2022-09-14 – 2022-09-15 (×4): 600 mg via ORAL
  Filled 2022-09-13 (×4): qty 1

## 2022-09-13 MED ORDER — DEXAMETHASONE SODIUM PHOSPHATE 10 MG/ML IJ SOLN
INTRAMUSCULAR | Status: DC | PRN
  Administered 2022-09-13: 6 mg via INTRAVENOUS

## 2022-09-13 MED ORDER — INSULIN ASPART 100 UNIT/ML IJ SOLN
0.0000 [IU] | INTRAMUSCULAR | Status: DC
  Administered 2022-09-13 – 2022-09-14 (×2): 2 [IU] via SUBCUTANEOUS

## 2022-09-13 MED ORDER — PHENYLEPHRINE 80 MCG/ML (10ML) SYRINGE FOR IV PUSH (FOR BLOOD PRESSURE SUPPORT)
PREFILLED_SYRINGE | INTRAVENOUS | Status: AC
  Filled 2022-09-13: qty 10

## 2022-09-13 MED ORDER — VASOPRESSIN 20 UNIT/ML IV SOLN
INTRAVENOUS | Status: DC | PRN
  Administered 2022-09-13: 30 mL via INTRAMUSCULAR

## 2022-09-13 MED ORDER — ORAL CARE MOUTH RINSE: 15.0000 mL | Freq: Once | OROMUCOSAL | Status: AC

## 2022-09-13 MED ORDER — ROPIVACAINE HCL 5 MG/ML IJ SOLN
INTRAMUSCULAR | Status: DC | PRN
  Administered 2022-09-13 (×2): 20 mL via PERINEURAL

## 2022-09-13 MED ORDER — FENTANYL CITRATE (PF) 100 MCG/2ML IJ SOLN
INTRAMUSCULAR | Status: DC | PRN
  Administered 2022-09-13: 50 ug via INTRAVENOUS
  Administered 2022-09-13: 100 ug via INTRAVENOUS

## 2022-09-13 MED ORDER — ONDANSETRON HCL 4 MG PO TABS: 4.0000 mg | ORAL_TABLET | Freq: Four times a day (QID) | ORAL | Status: DC | PRN

## 2022-09-13 MED ORDER — SODIUM CHLORIDE 0.9 % IV SOLN
2.0000 g | INTRAVENOUS | Status: AC
  Administered 2022-09-13: 2 g via INTRAVENOUS
  Filled 2022-09-13: qty 2

## 2022-09-13 MED ORDER — KETOROLAC TROMETHAMINE 30 MG/ML IJ SOLN
INTRAMUSCULAR | Status: AC
  Administered 2022-09-13: 30 mg
  Filled 2022-09-13: qty 1

## 2022-09-13 MED ORDER — LIDOCAINE HCL (CARDIAC) PF 100 MG/5ML IV SOSY
PREFILLED_SYRINGE | INTRAVENOUS | Status: DC | PRN
  Administered 2022-09-13: 60 mg via INTRAVENOUS

## 2022-09-13 MED ORDER — ROCURONIUM BROMIDE 100 MG/10ML IV SOLN
INTRAVENOUS | Status: DC | PRN
  Administered 2022-09-13: 50 mg via INTRAVENOUS
  Administered 2022-09-13: 20 mg via INTRAVENOUS

## 2022-09-13 MED ORDER — LIDOCAINE 2% (20 MG/ML) 5 ML SYRINGE
INTRAMUSCULAR | Status: AC
  Filled 2022-09-13: qty 5

## 2022-09-13 MED ORDER — KETAMINE HCL 50 MG/5ML IJ SOSY
PREFILLED_SYRINGE | INTRAMUSCULAR | Status: AC
  Filled 2022-09-13: qty 5

## 2022-09-13 MED ORDER — SIMVASTATIN 5 MG PO TABS
5.0000 mg | ORAL_TABLET | Freq: Every evening | ORAL | Status: DC
  Administered 2022-09-14: 5 mg via ORAL
  Filled 2022-09-13 (×3): qty 1

## 2022-09-13 MED ORDER — ACETAMINOPHEN 500 MG PO TABS
1000.0000 mg | ORAL_TABLET | ORAL | Status: AC
  Administered 2022-09-13: 1000 mg via ORAL
  Filled 2022-09-13: qty 2

## 2022-09-13 MED ORDER — SODIUM CHLORIDE (PF) 0.9 % IJ SOLN
INTRAMUSCULAR | Status: AC
  Filled 2022-09-13: qty 50

## 2022-09-13 MED ORDER — ONDANSETRON HCL 4 MG/2ML IJ SOLN: 4.0000 mg | Freq: Four times a day (QID) | INTRAMUSCULAR | Status: DC | PRN

## 2022-09-13 MED ORDER — FENTANYL CITRATE (PF) 100 MCG/2ML IJ SOLN
INTRAMUSCULAR | Status: AC
  Filled 2022-09-13: qty 2

## 2022-09-13 MED ORDER — MENTHOL 3 MG MT LOZG: 1.0000 | LOZENGE | OROMUCOSAL | Status: DC | PRN

## 2022-09-13 MED ORDER — EPHEDRINE SULFATE (PRESSORS) 50 MG/ML IJ SOLN
INTRAMUSCULAR | Status: DC | PRN
  Administered 2022-09-13 (×2): 10 mg via INTRAVENOUS
  Administered 2022-09-13: 5 mg via INTRAVENOUS

## 2022-09-13 MED ORDER — OXYCODONE HCL 5 MG PO TABS
5.0000 mg | ORAL_TABLET | ORAL | Status: DC | PRN
  Administered 2022-09-14: 5 mg via ORAL
  Administered 2022-09-14: 10 mg via ORAL
  Administered 2022-09-15: 5 mg via ORAL
  Filled 2022-09-13 (×2): qty 2
  Filled 2022-09-13: qty 1

## 2022-09-13 MED ORDER — KETOROLAC TROMETHAMINE 30 MG/ML IJ SOLN
30.0000 mg | Freq: Once | INTRAMUSCULAR | Status: AC
  Administered 2022-09-13: 30 mg via INTRAVENOUS

## 2022-09-13 MED ORDER — MIDAZOLAM HCL 5 MG/5ML IJ SOLN
INTRAMUSCULAR | Status: DC | PRN
  Administered 2022-09-13: 2 mg via INTRAVENOUS

## 2022-09-13 MED ORDER — CHLORHEXIDINE GLUCONATE 0.12 % MT SOLN
15.0000 mL | Freq: Once | OROMUCOSAL | Status: AC
  Administered 2022-09-13: 15 mL via OROMUCOSAL
  Filled 2022-09-13: qty 15

## 2022-09-13 MED ORDER — ALUM & MAG HYDROXIDE-SIMETH 200-200-20 MG/5ML PO SUSP: 30.0000 mL | ORAL | Status: DC | PRN

## 2022-09-13 MED ORDER — LACTATED RINGERS IV BOLUS
500.0000 mL | Freq: Once | INTRAVENOUS | Status: AC
  Administered 2022-09-14: 500 mL via INTRAVENOUS

## 2022-09-13 MED ORDER — OXYCODONE HCL 5 MG/5ML PO SOLN
ORAL | Status: AC
  Filled 2022-09-13: qty 5

## 2022-09-13 MED ORDER — PROPOFOL 10 MG/ML IV BOLUS
INTRAVENOUS | Status: AC
  Filled 2022-09-13: qty 20

## 2022-09-13 MED ORDER — LACTATED RINGERS IV SOLN: INTRAVENOUS | Status: DC

## 2022-09-13 MED ORDER — OXYCODONE HCL 5 MG PO TABS: 5.0000 mg | ORAL_TABLET | Freq: Once | ORAL | Status: AC | PRN

## 2022-09-13 MED ORDER — HEMOSTATIC AGENTS (NO CHARGE) OPTIME
TOPICAL | Status: DC | PRN
  Administered 2022-09-13: 1 via TOPICAL

## 2022-09-13 MED ORDER — PHENYLEPHRINE HCL (PRESSORS) 10 MG/ML IV SOLN
INTRAVENOUS | Status: AC
  Filled 2022-09-13: qty 1

## 2022-09-13 MED ORDER — 0.9 % SODIUM CHLORIDE (POUR BTL) OPTIME
TOPICAL | Status: DC | PRN
  Administered 2022-09-13: 1000 mL

## 2022-09-13 MED ORDER — OXYCODONE HCL 5 MG/5ML PO SOLN
5.0000 mg | Freq: Once | ORAL | Status: AC | PRN
  Administered 2022-09-13: 5 mg via ORAL

## 2022-09-13 MED ORDER — SUGAMMADEX SODIUM 500 MG/5ML IV SOLN
INTRAVENOUS | Status: DC | PRN
  Administered 2022-09-13: 400 mg via INTRAVENOUS

## 2022-09-13 MED ORDER — PHENYLEPHRINE HCL (PRESSORS) 10 MG/ML IV SOLN
INTRAVENOUS | Status: DC | PRN
  Administered 2022-09-13 (×5): 160 ug via INTRAVENOUS

## 2022-09-13 MED ORDER — KETAMINE HCL 10 MG/ML IJ SOLN
INTRAMUSCULAR | Status: DC | PRN
  Administered 2022-09-13: 20 mg via INTRAVENOUS

## 2022-09-13 MED ORDER — HYDROMORPHONE HCL 1 MG/ML IJ SOLN
INTRAMUSCULAR | Status: AC
  Filled 2022-09-13: qty 1

## 2022-09-13 MED ORDER — VASOPRESSIN 20 UNIT/ML IV SOLN
INTRAVENOUS | Status: AC
  Filled 2022-09-13: qty 1

## 2022-09-13 MED ORDER — KETOROLAC TROMETHAMINE 30 MG/ML IJ SOLN
30.0000 mg | Freq: Four times a day (QID) | INTRAMUSCULAR | Status: AC
  Administered 2022-09-14: 30 mg via INTRAVENOUS
  Filled 2022-09-13 (×3): qty 1

## 2022-09-13 MED ORDER — LISINOPRIL 20 MG PO TABS
20.0000 mg | ORAL_TABLET | Freq: Every day | ORAL | Status: DC
  Administered 2022-09-15: 20 mg via ORAL
  Filled 2022-09-13 (×2): qty 1

## 2022-09-13 MED ORDER — ONDANSETRON HCL 4 MG/2ML IJ SOLN
INTRAMUSCULAR | Status: AC
  Filled 2022-09-13: qty 2

## 2022-09-13 MED ORDER — PHENYLEPHRINE HCL-NACL 20-0.9 MG/250ML-% IV SOLN
INTRAVENOUS | Status: DC | PRN
  Administered 2022-09-13: 50 ug/min via INTRAVENOUS

## 2022-09-13 MED ORDER — PANTOPRAZOLE SODIUM 40 MG PO TBEC
40.0000 mg | DELAYED_RELEASE_TABLET | Freq: Every day | ORAL | Status: DC
  Administered 2022-09-13 – 2022-09-15 (×3): 40 mg via ORAL
  Filled 2022-09-13 (×3): qty 1

## 2022-09-13 MED ORDER — ZOLPIDEM TARTRATE 5 MG PO TABS: 5.0000 mg | ORAL_TABLET | Freq: Every evening | ORAL | Status: DC | PRN

## 2022-09-13 MED ORDER — ROPIVACAINE HCL 5 MG/ML IJ SOLN: INTRAMUSCULAR | Status: DC | PRN

## 2022-09-13 MED ORDER — DEXAMETHASONE SODIUM PHOSPHATE 10 MG/ML IJ SOLN
INTRAMUSCULAR | Status: AC
  Filled 2022-09-13: qty 1

## 2022-09-13 MED ORDER — HYDROMORPHONE HCL 1 MG/ML IJ SOLN
0.2000 mg | INTRAMUSCULAR | Status: DC | PRN
  Administered 2022-09-13: 0.6 mg via INTRAVENOUS

## 2022-09-13 MED ORDER — GABAPENTIN 300 MG PO CAPS
300.0000 mg | ORAL_CAPSULE | ORAL | Status: AC
  Administered 2022-09-13: 300 mg via ORAL
  Filled 2022-09-13: qty 1

## 2022-09-13 MED ORDER — MIDAZOLAM HCL 2 MG/2ML IJ SOLN
INTRAMUSCULAR | Status: AC
  Filled 2022-09-13: qty 2

## 2022-09-13 MED ORDER — ACETAMINOPHEN 500 MG PO TABS
1000.0000 mg | ORAL_TABLET | Freq: Four times a day (QID) | ORAL | Status: DC
  Administered 2022-09-13 – 2022-09-15 (×8): 1000 mg via ORAL
  Filled 2022-09-13 (×8): qty 2

## 2022-09-13 MED ORDER — EPHEDRINE 5 MG/ML INJ
INTRAVENOUS | Status: AC
  Filled 2022-09-13: qty 5

## 2022-09-13 MED ORDER — VENLAFAXINE HCL ER 75 MG PO CP24
225.0000 mg | ORAL_CAPSULE | Freq: Every day | ORAL | Status: DC
  Administered 2022-09-13 – 2022-09-14 (×2): 225 mg via ORAL
  Filled 2022-09-13 (×2): qty 3

## 2022-09-13 MED ORDER — PROPOFOL 10 MG/ML IV BOLUS
INTRAVENOUS | Status: DC | PRN
  Administered 2022-09-13: 150 mg via INTRAVENOUS

## 2022-09-13 MED ORDER — INSULIN ASPART 100 UNIT/ML IJ SOLN: 0.0000 [IU] | INTRAMUSCULAR | Status: DC | PRN

## 2022-09-13 MED ORDER — SENNA 8.6 MG PO TABS
1.0000 | ORAL_TABLET | Freq: Two times a day (BID) | ORAL | Status: DC
  Administered 2022-09-13 – 2022-09-15 (×4): 8.6 mg via ORAL
  Filled 2022-09-13 (×4): qty 1

## 2022-09-13 MED ORDER — ALBUMIN HUMAN 5 % IV SOLN: INTRAVENOUS | Status: DC | PRN

## 2022-09-13 MED ORDER — HYDROCHLOROTHIAZIDE 12.5 MG PO TABS
12.5000 mg | ORAL_TABLET | Freq: Every day | ORAL | Status: DC
  Administered 2022-09-14 – 2022-09-15 (×2): 12.5 mg via ORAL
  Filled 2022-09-13 (×2): qty 1

## 2022-09-13 SURGICAL SUPPLY — 45 items
APL SKNCLS STERI-STRIP NONHPOA (GAUZE/BANDAGES/DRESSINGS) ×1
BAG COUNTER SPONGE SURGICOUNT (BAG) ×1 IMPLANT
BAG SPNG CNTER NS LX DISP (BAG) ×1
BENZOIN TINCTURE PRP APPL 2/3 (GAUZE/BANDAGES/DRESSINGS) IMPLANT
DRAPE CESAREAN BIRTH W POUCH (DRAPES) ×2 IMPLANT
DRAPE WARM FLUID 44X44 (DRAPES) IMPLANT
DRSG OPSITE POSTOP 4X10 (GAUZE/BANDAGES/DRESSINGS) ×1 IMPLANT
DURAPREP 26ML APPLICATOR (WOUND CARE) ×1 IMPLANT
GAUZE 4X4 16PLY ~~LOC~~+RFID DBL (SPONGE) ×1 IMPLANT
GLOVE BIOGEL M 7.0 STRL (GLOVE) ×1 IMPLANT
GLOVE BIOGEL PI IND STRL 7.0 (GLOVE) ×3 IMPLANT
GOWN STRL REUS W/ TWL LRG LVL3 (GOWN DISPOSABLE) ×3 IMPLANT
GOWN STRL REUS W/TWL LRG LVL3 (GOWN DISPOSABLE) ×3
HEMOSTAT ARISTA ABSORB 3G PWDR (HEMOSTASIS) IMPLANT
HIBICLENS CHG 4% 4OZ BTL (MISCELLANEOUS) ×2 IMPLANT
KIT TURNOVER KIT B (KITS) ×1 IMPLANT
LIGASURE IMPACT 36 18CM CVD LR (INSTRUMENTS) IMPLANT
MANIFOLD NEPTUNE II (INSTRUMENTS) ×1 IMPLANT
NDL HYPO 22X1.5 SAFETY MO (MISCELLANEOUS) IMPLANT
NEEDLE HYPO 22X1.5 SAFETY MO (MISCELLANEOUS) IMPLANT
NS IRRIG 1000ML POUR BTL (IV SOLUTION) ×1 IMPLANT
PACK ABDOMINAL GYN (CUSTOM PROCEDURE TRAY) ×1 IMPLANT
PAD ARMBOARD 7.5X6 YLW CONV (MISCELLANEOUS) ×1 IMPLANT
PAD OB MATERNITY 4.3X12.25 (PERSONAL CARE ITEMS) ×1 IMPLANT
PENCIL SMOKE EVACUATOR (MISCELLANEOUS) ×1 IMPLANT
RTRCTR C-SECT PINK 25CM LRG (MISCELLANEOUS) IMPLANT
SPECIMEN JAR MEDIUM (MISCELLANEOUS) ×1 IMPLANT
SPIKE FLUID TRANSFER (MISCELLANEOUS) IMPLANT
SPONGE T-LAP 18X18 ~~LOC~~+RFID (SPONGE) ×2 IMPLANT
STAPLER VISISTAT 35W (STAPLE) IMPLANT
STRIP CLOSURE SKIN 1/2X4 (GAUZE/BANDAGES/DRESSINGS) IMPLANT
SUT PDS AB 0 CT1 27 (SUTURE) ×2 IMPLANT
SUT VIC AB 0 CT1 27 (SUTURE) ×1
SUT VIC AB 0 CT1 27XCR 8 STRN (SUTURE) ×2 IMPLANT
SUT VIC AB 0 CT1 36 (SUTURE) ×1 IMPLANT
SUT VIC AB 2-0 CT1 (SUTURE) IMPLANT
SUT VIC AB 2-0 CT1 27 (SUTURE)
SUT VIC AB 2-0 CT1 TAPERPNT 27 (SUTURE) ×1 IMPLANT
SUT VIC AB 2-0 SH 27 (SUTURE)
SUT VIC AB 2-0 SH 27XBRD (SUTURE) ×1 IMPLANT
SUT VIC AB 4-0 KS 27 (SUTURE) IMPLANT
SUT VICRYL 0 TIES 12 18 (SUTURE) ×1 IMPLANT
SYR CONTROL 10ML LL (SYRINGE) IMPLANT
TOWEL GREEN STERILE FF (TOWEL DISPOSABLE) ×2 IMPLANT
TRAY FOLEY W/BAG SLVR 14FR (SET/KITS/TRAYS/PACK) ×1 IMPLANT

## 2022-09-13 NOTE — Op Note (Signed)
09/13/2022  11:27 AM  PATIENT:  Taylor Whitney  52 y.o. female  PRE-OPERATIVE DIAGNOSIS:  Fibroids  POST-OPERATIVE DIAGNOSIS:  Fibroids  PROCEDURE:  Procedure(s): TOTAL HYSTERECTOMY ABDOMINAL WITH BILATERAL  SALPINGECTOMY (Bilateral)  SURGEON:  Surgeon(s) and Role:    Gerald Leitz, MD - Primary    Connye Burkitt, Efraim Kaufmann, DO - Assisting due to complexity of the anatomy and concern for adhesive disease   PHYSICIAN ASSISTANT:   ASSISTANTS: Dr. Steva Ready assisted due to complexity of the anatomy and concern for adhesive disease    ANESTHESIA:   general  EBL:  100 mL   BLOOD ADMINISTERED:none  DRAINS: Urinary Catheter (Foley)   LOCAL MEDICATIONS USED:  OTHER tap block performed by the anesthesiologist   SPECIMEN:  Source of Specimen:  uterus cervix and bilateral fallopian tubes   DISPOSITION OF SPECIMEN:  PATHOLOGY  COUNTS:  YES  TOURNIQUET:  * No tourniquets in log *  DICTATION: .Note written in EPIC  PLAN OF CARE: Admit to inpatient   PATIENT DISPOSITION:  PACU - hemodynamically stable.   Delay start of Pharmacological VTE agent (>24hrs) due to surgical blood loss or risk of bleeding: not applicable  Findings: Enlarged fibroid uterus/ normal appearing ovaries bilaterally. Shortened left fallopian tube. Normal appearing right fallopian tube. Normal appearing cervix.   Hysterectomy Procedure Note   Procedure Details  The patient was seen in the Holding Room. The risks, benefits, complications, treatment options, and expected outcomes were discussed with the patient.  The patient concurred with the proposed plan, giving informed consent.  The site of surgery properly noted/marked. The patient was taken to Operating Room # 6, identified as Taylor Whitney and the procedure verified as Total abdominal hysterectomy with bilateral salpingectomy . A Time Out was held and the above information confirmed.  After induction of anesthesia, the patient was draped and  prepped in the usual sterile manner. Pt was placed in supine position after anesthesia and draped and prepped in the usual sterile manner. Foley catheter was placed.  A pfannestiehl incision was made and carried through the subcutaneous tissue to the fascia. Fascial incision was made and extended laterally . The rectus muscles were separated. The peritoneum was identified and entered. Peritoneal incision was extended longitudinally.  The above findings were noted. Alexis retractor was placed and bowel was packed away from the surgical site.   The round ligaments were identified, cut, and ligated with 0-Vicryl. The anterior peritoneal reflection was incised and the bladder was dissected off the lower uterine segment. The retroperitoneal space was explored and the ureters were identified bilaterally. The right mesosalpinx and uteroovarian ligament  grasped , cauterized  and cut with the ligasure.  The left mesosalpinx and uteroovarian ligament were grasped, cauterized and cut  with ligasure. Hemostasis  was observed. The uterine vessels were skeletonized, then clamped, cauterized and cut with the ligasure . Serial pedicles of the cardinal and utero-sacral ligaments were clamped, cut, and suture ligated with 0-Vicryl. Entrance was made into the vagina and the uterus removed. Vaginal cuff angle sutures were placed incorporating the utero-sacral ligaments for support. The vaginal cuff was then closed with a running stitch of 0- Vicryl. Lavage was carried out until clear. Hemostasis was observed.Arista was placed along the vaginal cuff and adnexa   Retractor and all packing was removed from the abdomen. The fascia was approximated with running sutures of 0-PDS. Lavage was again carried out. Hemostasis was observed. The skin was approximated with staples.  Instrument, sponge, and  needle counts were correct prior to abdominal closure and at the conclusion of the case.

## 2022-09-13 NOTE — H&P (Signed)
Date of Initial H&P: 09/11/2022  History reviewed, patient examined, no change in status, stable for surgery.

## 2022-09-13 NOTE — Anesthesia Procedure Notes (Signed)
Anesthesia Regional Block: TAP block   Pre-Anesthetic Checklist: , timeout performed,  Correct Patient, Correct Site, Correct Laterality,  Correct Procedure, Correct Position, site marked,  Risks and benefits discussed,  Surgical consent,  Pre-op evaluation,  At surgeon's request and post-op pain management  Laterality: Right  Prep: chloraprep       Needles:  Injection technique: Single-shot  Needle Type: Echogenic Needle     Needle Length: 9cm  Needle Gauge: 21     Additional Needles:   Narrative:  Start time: 09/13/2022 9:08 AM End time: 09/13/2022 9:14 AM Injection made incrementally with aspirations every 5 mL.  Performed by: Personally  Anesthesiologist: Achille Rich, MD  Additional Notes: Pt tolerated the procedure well.

## 2022-09-13 NOTE — Transfer of Care (Signed)
Immediate Anesthesia Transfer of Care Note  Patient: Taylor Whitney  Procedure(s) Performed: TOTAL HYSTERECTOMY ABDOMINAL WITH BILATERAL  SALPINGECTOMY (Bilateral)  Patient Location: PACU  Anesthesia Type:General  Level of Consciousness: awake, drowsy, and patient cooperative  Airway & Oxygen Therapy: Patient Spontanous Breathing and Patient connected to face mask oxygen  Post-op Assessment: Report given to RN, Post -op Vital signs reviewed and stable, and Patient moving all extremities X 4  Post vital signs: Reviewed and stable  Last Vitals:  Vitals Value Taken Time  BP 104/70 09/13/22 1115  Temp 36.5 C 09/13/22 1112  Pulse 106 09/13/22 1120  Resp 29 09/13/22 1120  SpO2 99 % 09/13/22 1120  Vitals shown include unvalidated device data.  Last Pain:  Vitals:   09/13/22 0712  TempSrc:   PainSc: 0-No pain         Complications:  Encounter Notable Events  Notable Event Outcome Phase Comment  Difficult to intubate - expected  Intraprocedure Filed from anesthesia note documentation.

## 2022-09-13 NOTE — Anesthesia Procedure Notes (Signed)
Anesthesia Regional Block: TAP block   Pre-Anesthetic Checklist: , timeout performed,  Correct Patient, Correct Site, Correct Laterality,  Correct Procedure, Correct Position, site marked,  Risks and benefits discussed,  Surgical consent,  Pre-op evaluation,  At surgeon's request and post-op pain management  Laterality: Left  Prep: chloraprep       Needles:  Injection technique: Single-shot  Needle Type: Echogenic Needle     Needle Length: 9cm  Needle Gauge: 21     Additional Needles:   Narrative:  Start time: 09/13/2022 9:00 AM End time: 09/13/2022 9:06 AM Injection made incrementally with aspirations every 5 mL.  Performed by: Personally  Anesthesiologist: Achille Rich, MD  Additional Notes: Pt tolerated the procedure well.

## 2022-09-13 NOTE — Anesthesia Procedure Notes (Signed)
Procedure Name: Intubation Date/Time: 09/13/2022 9:04 AM  Performed by: Shanon Payor, CRNAPre-anesthesia Checklist: Patient identified, Emergency Drugs available, Suction available, Patient being monitored and Timeout performed Patient Re-evaluated:Patient Re-evaluated prior to induction Oxygen Delivery Method: Circle system utilized Preoxygenation: Pre-oxygenation with 100% oxygen Induction Type: IV induction Ventilation: Mask ventilation without difficulty Laryngoscope Size: Glidescope and 3 Grade View: Grade II Tube type: Oral Tube size: 7.0 mm Number of attempts: 1 Airway Equipment and Method: Rigid stylet Placement Confirmation: ETT inserted through vocal cords under direct vision, CO2 detector, positive ETCO2 and breath sounds checked- equal and bilateral Secured at: 22 cm Tube secured with: Tape Dental Injury: Teeth and Oropharynx as per pre-operative assessment  Difficulty Due To: Difficulty was anticipated and Difficult Airway- due to anterior larynx

## 2022-09-13 NOTE — Progress Notes (Signed)
Day of Surgery Procedure(s) (LRB): TOTAL HYSTERECTOMY ABDOMINAL WITH BILATERAL  SALPINGECTOMY (Bilateral)  Subjective: Patient reports tolerating PO.  Patient is tolerating clear liquids. Pain is 3 out of 10. No nausea or emesis   Objective: I have reviewed patient's vital signs, intake and output, and medications.  General: alert, cooperative, and no distress Resp: No distress  GI: soft appropriately tender nondistended  Extremities: extremities normal, atraumatic, no cyanosis or edema  Assessment: s/p Procedure(s): TOTAL HYSTERECTOMY ABDOMINAL WITH BILATERAL  SALPINGECTOMY (Bilateral): stable POD #0  Plan: Advance diet Decreased urine output- concentrated appearing urine  plan 500 cc bolus of LR D/C foley at 700 am if uop is greater than 30 cc every hour.  Dr. Connye Burkitt covering tomorrow 09/14/2022 starting at 7 am.   LOS: 0 days    Gerald Leitz, MD 09/13/2022, 10:42 PM

## 2022-09-14 ENCOUNTER — Encounter (HOSPITAL_COMMUNITY): Payer: Self-pay | Admitting: Obstetrics and Gynecology

## 2022-09-14 LAB — CBC
HCT: 22.6 % — ABNORMAL LOW (ref 36.0–46.0)
HCT: 24.7 % — ABNORMAL LOW (ref 36.0–46.0)
Hemoglobin: 7.5 g/dL — ABNORMAL LOW (ref 12.0–15.0)
Hemoglobin: 7.7 g/dL — ABNORMAL LOW (ref 12.0–15.0)
MCH: 28.4 pg (ref 26.0–34.0)
MCH: 27.8 pg (ref 26.0–34.0)
MCHC: 33.2 g/dL (ref 30.0–36.0)
MCHC: 31.2 g/dL (ref 30.0–36.0)
MCV: 85.6 fL (ref 80.0–100.0)
MCV: 89.2 fL (ref 80.0–100.0)
Platelets: 290 10*3/uL (ref 150–400)
Platelets: 329 10*3/uL (ref 150–400)
RBC: 2.64 MIL/uL — ABNORMAL LOW (ref 3.87–5.11)
RBC: 2.77 MIL/uL — ABNORMAL LOW (ref 3.87–5.11)
RDW: 14.4 % (ref 11.5–15.5)
RDW: 14.4 % (ref 11.5–15.5)
WBC: 10.6 10*3/uL — ABNORMAL HIGH (ref 4.0–10.5)
WBC: 8.6 10*3/uL (ref 4.0–10.5)
nRBC: 0 % (ref 0.0–0.2)
nRBC: 0 % (ref 0.0–0.2)

## 2022-09-14 LAB — GLUCOSE, CAPILLARY
Glucose-Capillary: 117 mg/dL — ABNORMAL HIGH (ref 70–99)
Glucose-Capillary: 117 mg/dL — ABNORMAL HIGH (ref 70–99)
Glucose-Capillary: 123 mg/dL — ABNORMAL HIGH (ref 70–99)
Glucose-Capillary: 78 mg/dL (ref 70–99)
Glucose-Capillary: 82 mg/dL (ref 70–99)
Glucose-Capillary: 85 mg/dL (ref 70–99)
Glucose-Capillary: 91 mg/dL (ref 70–99)

## 2022-09-14 LAB — SURGICAL PATHOLOGY

## 2022-09-14 MED ORDER — LACTATED RINGERS IV BOLUS
500.0000 mL | Freq: Once | INTRAVENOUS | Status: AC
  Administered 2022-09-14: 500 mL via INTRAVENOUS

## 2022-09-14 NOTE — Anesthesia Postprocedure Evaluation (Signed)
Anesthesia Post Note  Patient: Taylor Whitney  Procedure(s) Performed: TOTAL HYSTERECTOMY ABDOMINAL WITH BILATERAL  SALPINGECTOMY (Bilateral)     Patient location during evaluation: PACU Anesthesia Type: General Level of consciousness: awake and alert Pain management: pain level controlled Vital Signs Assessment: post-procedure vital signs reviewed and stable Respiratory status: spontaneous breathing, nonlabored ventilation, respiratory function stable and patient connected to nasal cannula oxygen Cardiovascular status: blood pressure returned to baseline and stable Postop Assessment: no apparent nausea or vomiting Anesthetic complications: yes   Encounter Notable Events  Notable Event Outcome Phase Comment  Difficult to intubate - expected  Intraprocedure Filed from anesthesia note documentation.    Last Vitals:  Vitals:   09/14/22 0001 09/14/22 0453  BP: 98/66 96/68  Pulse: 86 86  Resp:    Temp: 36.4 C 37.3 C  SpO2: 99% 95%    Last Pain:  Vitals:   09/14/22 0501  TempSrc:   PainSc: 6                  Diontre Harps S

## 2022-09-14 NOTE — TOC CM/SW Note (Signed)
  Transition of Care [facility]) Screening Note   Patient Details  Name: Taylor Whitney Date of Birth: 1971/02/28     Transition of Care Department [facility]) has reviewed patient and no TOC needs have been identified at this time. We will continue to monitor patient advancement through interdisciplinary progression rounds. If new patient transition needs arise, please place a TOC consult.

## 2022-09-14 NOTE — Progress Notes (Signed)
   09/14/22 1039  Mobility  Activity Ambulated with assistance in hallway  Level of Assistance Standby assist, set-up cues, supervision of patient - no hands on  Assistive Device  (iv pole)  Distance Ambulated (ft) 250 ft  Activity Response Tolerated well  Mobility Referral Yes  $Mobility charge 1 Mobility    Mobility Specialist Progress Note  Received pt in bed having no complaints and agreeable to mobility. Pt was asymptomatic throughout ambulation and returned to room w/o fault. Left in bed w/ call bell in reach and all needs met.  Anastasia Pall Mobility Specialist  Please contact via SecureChat or Rehab office at [phone]

## 2022-09-14 NOTE — Progress Notes (Signed)
GYN Post-Op Note  Subjective:  Patient is doing well. Pain is well-controlled. Has not had breakfast. Has not ambulated yet. Foley in place. States menses started around 4/7. Denies fevers, chills, chest pain, SOB, N/V, or bilateral LE edema.  Objective: BP 96/68   Pulse 86   Temp 99.1 F (37.3 C) (Oral)   Resp 16   Ht 5\' 4"  (1.626 m)   Wt 68 kg   SpO2 95%   BMI 25.75 kg/m  Gen:  NAD, pleasant and cooperative Cardio:  RRR Pulm:  Normal-work of breathing Abd:  Soft, non-distended, appropriately tender throughout, no rebound/guarding, honeycomb dressing clean/dry/intact without any shadowing Ext:  No bilateral LE edema, no bilateral calf tenderness, SCDs on an working (placed)  Foley: 200cc clear yellow urine  UOP: 400cc between 0400-0500     Latest Ref Rng & Units 09/14/2022    1:57 AM 09/05/2022   11:27 AM 01/08/2019    9:00 AM  CBC  WBC 4.0 - 10.5 K/uL 10.6  18.6  10.8   Hemoglobin 12.0 - 15.0 g/dL 7.5  93.5  52.1   Hematocrit 36.0 - 46.0 % 22.6  34.7  41.8   Platelets 150 - 400 K/uL 290  421  298      A/P: POD#1 s/p TAH/BS for AUB.  - Doing well post-operatively - Vital signs unremarkable - Pulm:  RA - GI:  DAT, Pepcid, Senna - Renal:  Foley in place - remove today due to adequate UOP, I&O q4h - IVF:  LR at 125cc/hour - Pain:  Pre-operative TAP block placed, Toradol --> Motrin, Oxycodone, Tylenol - DVT Prophylaxis:  SCDs in bed - Activity:  Ambulate today, SCDs while in bed - Labs:  Acute blood loss anemia as above, repeat CBC ordered at 1200 due to discrepancy between blood loss at the time of surgery and current Hgb (suspect patient became more anemic after menses that occurred around the time of pre-operative labs)  Disposition: D/C home tomorrow.  Steva Ready, DO

## 2022-09-14 NOTE — Progress Notes (Signed)
IV to right hand infiltrated. IV removed and MD made aware. MD states okay to leave IV out at this time, patient made aware.

## 2022-09-15 LAB — GLUCOSE, CAPILLARY
Glucose-Capillary: 84 mg/dL (ref 70–99)
Glucose-Capillary: 74 mg/dL (ref 70–99)
Glucose-Capillary: 85 mg/dL (ref 70–99)

## 2022-09-15 LAB — CBC
HCT: 24.4 % — ABNORMAL LOW (ref 36.0–46.0)
Hemoglobin: 8 g/dL — ABNORMAL LOW (ref 12.0–15.0)
MCH: 28.2 pg (ref 26.0–34.0)
MCHC: 32.8 g/dL (ref 30.0–36.0)
MCV: 85.9 fL (ref 80.0–100.0)
Platelets: 337 10*3/uL (ref 150–400)
RBC: 2.84 MIL/uL — ABNORMAL LOW (ref 3.87–5.11)
RDW: 14.4 % (ref 11.5–15.5)
WBC: 8.9 10*3/uL (ref 4.0–10.5)
nRBC: 0 % (ref 0.0–0.2)

## 2022-09-15 MED ORDER — OXYCODONE HCL 5 MG PO TABS: 5.0000 mg | ORAL_TABLET | ORAL | 0 refills | Status: DC | PRN

## 2022-09-15 MED ORDER — IBUPROFEN 600 MG PO TABS: 600.0000 mg | ORAL_TABLET | Freq: Four times a day (QID) | ORAL | 1 refills | Status: AC | PRN

## 2022-09-15 NOTE — Discharge Summary (Signed)
Physician Discharge Summary  Patient ID: Taylor Whitney MRN: [id number] DOB/AGE: [birth year] 52 y.o.  Admit date: 09/13/2022 Discharge date: 09/15/2022  Admission Diagnoses:Menorrhagia/Iron Deficiency Anemia/ Fibroids/ Type 2 DM  Discharge Diagnoses:  Principal Problem:   Fibroids Active Problems:   S/P TAH (total abdominal hysterectomy)   Discharged Condition: stable  Hospital Course: pt was admitted after undergoing a total abdominal hysterectomy with bilateral salpingectomy. She did well postoperatively with return of bowel and bladder function. She is tolerating PO. She is discharged home in stable condition on POD #2.   Consults: None  Significant Diagnostic Studies: labs:  Results for orders placed or performed during the hospital encounter of 09/13/22 (from the past 48 hour(s))  Glucose, capillary     Status: Abnormal   Collection Time: 09/13/22  5:49 PM  Result Value Ref Range   Glucose-Capillary 121 (H) 70 - 99 mg/dL    Comment: Glucose reference range applies only to samples taken after fasting for at least 8 hours.  Glucose, capillary     Status: Abnormal   Collection Time: 09/13/22  7:43 PM  Result Value Ref Range   Glucose-Capillary 138 (H) 70 - 99 mg/dL    Comment: Glucose reference range applies only to samples taken after fasting for at least 8 hours.  Glucose, capillary     Status: None   Collection Time: 09/14/22 12:00 AM  Result Value Ref Range   Glucose-Capillary 91 70 - 99 mg/dL    Comment: Glucose reference range applies only to samples taken after fasting for at least 8 hours.  CBC     Status: Abnormal   Collection Time: 09/14/22  1:57 AM  Result Value Ref Range   WBC 10.6 (H) 4.0 - 10.5 K/uL   RBC 2.64 (L) 3.87 - 5.11 MIL/uL   Hemoglobin 7.5 (L) 12.0 - 15.0 g/dL   HCT 40.9 (L) 81.1 - 91.4 %   MCV 85.6 80.0 - 100.0 fL   MCH 28.4 26.0 - 34.0 pg   MCHC 33.2 30.0 - 36.0 g/dL   RDW 78.2 95.6 - 21.3 %   Platelets 290 150 - 400 K/uL   nRBC 0.0  0.0 - 0.2 %    Comment: Performed at [facility], [address], [place], [place] [address]  Glucose, capillary     Status: None   Collection Time: 09/14/22  4:53 AM  Result Value Ref Range   Glucose-Capillary 78 70 - 99 mg/dL    Comment: Glucose reference range applies only to samples taken after fasting for at least 8 hours.  Glucose, capillary     Status: None   Collection Time: 09/14/22  8:16 AM  Result Value Ref Range   Glucose-Capillary 85 70 - 99 mg/dL    Comment: Glucose reference range applies only to samples taken after fasting for at least 8 hours.  CBC     Status: Abnormal   Collection Time: 09/14/22 11:26 AM  Result Value Ref Range   WBC 8.6 4.0 - 10.5 K/uL   RBC 2.77 (L) 3.87 - 5.11 MIL/uL   Hemoglobin 7.7 (L) 12.0 - 15.0 g/dL   HCT 84.6 (L) 96.2 - 95.2 %   MCV 89.2 80.0 - 100.0 fL   MCH 27.8 26.0 - 34.0 pg   MCHC 31.2 30.0 - 36.0 g/dL   RDW 84.1 32.4 - 40.1 %   Platelets 329 150 - 400 K/uL   nRBC 0.0 0.0 - 0.2 %    Comment: Performed at [facility]  [facility], [address], [place], [place] [address]  Glucose, capillary     Status: Abnormal   Collection Time: 09/14/22 11:57 AM  Result Value Ref Range   Glucose-Capillary 123 (H) 70 - 99 mg/dL    Comment: Glucose reference range applies only to samples taken after fasting for at least 8 hours.  Glucose, capillary     Status: Abnormal   Collection Time: 09/14/22  4:00 PM  Result Value Ref Range   Glucose-Capillary 117 (H) 70 - 99 mg/dL    Comment: Glucose reference range applies only to samples taken after fasting for at least 8 hours.  Glucose, capillary     Status: None   Collection Time: 09/14/22  7:38 PM  Result Value Ref Range   Glucose-Capillary 82 70 - 99 mg/dL    Comment: Glucose reference range applies only to samples taken after fasting for at least 8 hours.  Glucose, capillary     Status: Abnormal   Collection Time: 09/14/22 11:19 PM  Result Value Ref Range   Glucose-Capillary 117 (H) 70 - 99  mg/dL    Comment: Glucose reference range applies only to samples taken after fasting for at least 8 hours.  CBC     Status: Abnormal   Collection Time: 09/15/22  1:08 AM  Result Value Ref Range   WBC 8.9 4.0 - 10.5 K/uL   RBC 2.84 (L) 3.87 - 5.11 MIL/uL   Hemoglobin 8.0 (L) 12.0 - 15.0 g/dL   HCT 60.4 (L) 54.0 - 98.1 %   MCV 85.9 80.0 - 100.0 fL   MCH 28.2 26.0 - 34.0 pg   MCHC 32.8 30.0 - 36.0 g/dL   RDW 19.1 47.8 - 29.5 %   Platelets 337 150 - 400 K/uL   nRBC 0.0 0.0 - 0.2 %    Comment: Performed at [facility], [address], [place], [place] [address]  Glucose, capillary     Status: None   Collection Time: 09/15/22  4:05 AM  Result Value Ref Range   Glucose-Capillary 84 70 - 99 mg/dL    Comment: Glucose reference range applies only to samples taken after fasting for at least 8 hours.  Glucose, capillary     Status: None   Collection Time: 09/15/22  8:34 AM  Result Value Ref Range   Glucose-Capillary 74 70 - 99 mg/dL    Comment: Glucose reference range applies only to samples taken after fasting for at least 8 hours.   Comment 1 Notify RN   Glucose, capillary     Status: None   Collection Time: 09/15/22 12:15 PM  Result Value Ref Range   Glucose-Capillary 85 70 - 99 mg/dL    Comment: Glucose reference range applies only to samples taken after fasting for at least 8 hours.     Treatments: surgery: total abdominal hysterectomy with bilateral salpingectomy   Discharge Exam: Blood pressure 123/84, pulse 91, temperature 98.2 F (36.8 C), temperature source Oral, resp. rate 16, height  (1.626 m), weight 68 kg, SpO2 99 %. General appearance: alert, cooperative, and no distress Resp: no distress GI: soft appropriately tender nondistended Extremities: extremities normal, atraumatic, no cyanosis or edema Incision/Wound: Well approximated no erythema   Disposition: Discharge disposition: 01-Home or Self Care       Discharge Instructions      Remove dressing  in 72 hours   Complete by: As directed    Call MD for:  persistant nausea and vomiting  Complete by: As directed    Call MD for:  redness, tenderness, or signs of infection (pain, swelling, redness, odor or green/yellow discharge around incision site)   Complete by: As directed    Call MD for:  severe uncontrolled pain   Complete by: As directed    Call MD for:  temperature >100.4   Complete by: As directed    Diet Carb Modified   Complete by: As directed    Driving Restrictions   Complete by: As directed    Avoid driving for 2 weeks   Increase activity slowly   Complete by: As directed    Lifting restrictions   Complete by: As directed    Avoid lifting over 10 lbs for 6 weeks   May shower / Bathe   Complete by: As directed    May walk up steps   Complete by: As directed    Sexual Activity Restrictions   Complete by: As directed    Avoid sex for 6 weeks      Allergies as of 09/15/2022       Reactions   Semaglutide Nausea And Vomiting   Rybelsus tablets cause nausea and vomiting, tolerates injections   Sulfa Antibiotics Rash        Medication List     STOP taking these medications    naproxen sodium 220 MG tablet Commonly known as: ALEVE       TAKE these medications    acetaminophen 500 MG tablet Commonly known as: TYLENOL Take 1,000 mg by mouth every 6 (six) hours as needed for moderate pain.   ibuprofen 600 MG tablet Commonly known as: ADVIL Take 1 tablet (600 mg total) by mouth every 6 (six) hours as needed for mild pain, moderate pain or cramping. What changed:  medication strength how much to take reasons to take this   lisinopril-hydrochlorothiazide 20-12.5 MG tablet Commonly known as: ZESTORETIC Take 2 tablets by mouth daily.   metFORMIN 500 MG 24 hr tablet Commonly known as: GLUCOPHAGE-XR Take 1,000 mg by mouth 2 (two) times daily.   oxyCODONE 5 MG immediate release tablet Commonly known as: Oxy IR/ROXICODONE Take 1-2 tablets (5-10 mg  total) by mouth every 4 (four) hours as needed for moderate pain or severe pain.   Ozempic (1 MG/DOSE) 2 MG/1.5ML Sopn Generic drug: Semaglutide (1 MG/DOSE) Inject 1 mg into the skin every Sunday.   simvastatin 5 MG tablet Commonly known as: ZOCOR Take 5 mg by mouth every evening.   venlafaxine XR 75 MG 24 hr capsule Commonly known as: EFFEXOR-XR Take 225 mg by mouth at bedtime.        Follow-up Information     Gerald Leitz, MD. Go in 2 week(s).   Specialty: Obstetrics and Gynecology Contact information: [address] [place] [address] [phone]                 Signed: Gerald Leitz 09/15/2022, 12:47 PM

## 2022-09-15 NOTE — Progress Notes (Signed)
Patient being discharge home, self-care. PIV removed, tolerated well. AVS discussed, pt verbalized understanding. Wound/dressing care and restrictions also discussed. Discharge medications verified ready to pick up from home pharmacy on file [facility] [phone]. Patient sister at bedside to tranport patient home. Patient left the unit with all belongings in NAD.

## 2022-09-15 NOTE — Plan of Care (Signed)
  Problem: Education: Goal: Ability to describe self-care measures that may prevent or decrease complications (Diabetes Survival Skills Education) will improve Outcome: Adequate for Discharge Goal: Individualized Educational Video(s) Outcome: Adequate for Discharge   Problem: Coping: Goal: Ability to adjust to condition or change in health will improve Outcome: Adequate for Discharge   Problem: Fluid Volume: Goal: Ability to maintain a balanced intake and output will improve Outcome: Adequate for Discharge   Problem: Health Behavior/Discharge Planning: Goal: Ability to identify and utilize available resources and services will improve Outcome: Adequate for Discharge Goal: Ability to manage health-related needs will improve Outcome: Adequate for Discharge   Problem: Metabolic: Goal: Ability to maintain appropriate glucose levels will improve Outcome: Adequate for Discharge   Problem: Nutritional: Goal: Maintenance of adequate nutrition will improve Outcome: Adequate for Discharge Goal: Progress toward achieving an optimal weight will improve Outcome: Adequate for Discharge   Problem: Skin Integrity: Goal: Risk for impaired skin integrity will decrease Outcome: Adequate for Discharge   Problem: Tissue Perfusion: Goal: Adequacy of tissue perfusion will improve Outcome: Adequate for Discharge   Problem: Education: Goal: Knowledge of the prescribed therapeutic regimen will improve Outcome: Adequate for Discharge Goal: Understanding of sexual limitations or changes related to disease process or condition will improve Outcome: Adequate for Discharge Goal: Individualized Educational Video(s) Outcome: Adequate for Discharge   Problem: Self-Concept: Goal: Communication of feelings regarding changes in body function or appearance will improve Outcome: Adequate for Discharge   Problem: Skin Integrity: Goal: Demonstration of wound healing without infection will improve Outcome:  Adequate for Discharge   Problem: Education: Goal: Knowledge of General Education information will improve Description: Including pain rating scale, medication(s)/side effects and non-pharmacologic comfort measures Outcome: Adequate for Discharge   Problem: Health Behavior/Discharge Planning: Goal: Ability to manage health-related needs will improve Outcome: Adequate for Discharge   Problem: Clinical Measurements: Goal: Ability to maintain clinical measurements within normal limits will improve Outcome: Adequate for Discharge Goal: Will remain free from infection Outcome: Adequate for Discharge Goal: Diagnostic test results will improve Outcome: Adequate for Discharge Goal: Respiratory complications will improve Outcome: Adequate for Discharge Goal: Cardiovascular complication will be avoided Outcome: Adequate for Discharge   Problem: Activity: Goal: Risk for activity intolerance will decrease Outcome: Adequate for Discharge   Problem: Nutrition: Goal: Adequate nutrition will be maintained Outcome: Adequate for Discharge   Problem: Coping: Goal: Level of anxiety will decrease Outcome: Adequate for Discharge   Problem: Elimination: Goal: Will not experience complications related to bowel motility Outcome: Adequate for Discharge Goal: Will not experience complications related to urinary retention Outcome: Adequate for Discharge   Problem: Pain Managment: Goal: General experience of comfort will improve Outcome: Adequate for Discharge   Problem: Safety: Goal: Ability to remain free from injury will improve Outcome: Adequate for Discharge   Problem: Skin Integrity: Goal: Risk for impaired skin integrity will decrease Outcome: Adequate for Discharge   

## 2022-09-27 DIAGNOSIS — R3911 Hesitancy of micturition: Secondary | ICD-10-CM | POA: Diagnosis not present

## 2022-10-13 ENCOUNTER — Other Ambulatory Visit: Payer: Self-pay | Admitting: Family Medicine

## 2022-10-13 DIAGNOSIS — Z1231 Encounter for screening mammogram for malignant neoplasm of breast: Secondary | ICD-10-CM

## 2022-10-17 ENCOUNTER — Ambulatory Visit
Admission: RE | Admit: 2022-10-17 | Discharge: 2022-10-17 | Disposition: A | Discharge status: Home / Self Care | Payer: BC Managed Care – PPO | Source: Ambulatory Visit | Attending: Family Medicine | Admitting: Family Medicine

## 2022-10-17 DIAGNOSIS — E1169 Type 2 diabetes mellitus with other specified complication: Secondary | ICD-10-CM | POA: Diagnosis not present

## 2022-10-17 DIAGNOSIS — I10 Essential (primary) hypertension: Secondary | ICD-10-CM | POA: Diagnosis not present

## 2022-10-17 DIAGNOSIS — Z1231 Encounter for screening mammogram for malignant neoplasm of breast: Secondary | ICD-10-CM | POA: Diagnosis not present

## 2022-10-17 DIAGNOSIS — F325 Major depressive disorder, single episode, in full remission: Secondary | ICD-10-CM | POA: Diagnosis not present

## 2023-03-02 DIAGNOSIS — D509 Iron deficiency anemia, unspecified: Secondary | ICD-10-CM | POA: Diagnosis not present

## 2023-03-02 DIAGNOSIS — E119 Type 2 diabetes mellitus without complications: Secondary | ICD-10-CM | POA: Diagnosis not present

## 2023-03-02 DIAGNOSIS — R109 Unspecified abdominal pain: Secondary | ICD-10-CM | POA: Diagnosis not present

## 2023-03-21 ENCOUNTER — Telehealth: Payer: Self-pay | Admitting: Gastroenterology

## 2023-03-21 NOTE — Telephone Encounter (Signed)
Called patient to schedule colonoscopy. Left voicemail.

## 2023-04-03 ENCOUNTER — Encounter: Payer: Self-pay | Admitting: Gastroenterology

## 2023-05-18 DIAGNOSIS — Z0000 Encounter for general adult medical examination without abnormal findings: Secondary | ICD-10-CM | POA: Diagnosis not present

## 2023-05-18 DIAGNOSIS — I10 Essential (primary) hypertension: Secondary | ICD-10-CM | POA: Diagnosis not present

## 2023-05-18 DIAGNOSIS — E1169 Type 2 diabetes mellitus with other specified complication: Secondary | ICD-10-CM | POA: Diagnosis not present

## 2023-05-18 DIAGNOSIS — D509 Iron deficiency anemia, unspecified: Secondary | ICD-10-CM | POA: Diagnosis not present

## 2023-05-18 DIAGNOSIS — F325 Major depressive disorder, single episode, in full remission: Secondary | ICD-10-CM | POA: Diagnosis not present

## 2023-07-11 IMAGING — CR DG HIP (WITH OR WITHOUT PELVIS) 2-3V*L*
2 series · 2 of 2 positions shown · non-contrast
Comparison: None.

CLINICAL DATA: Left hip pain.

EXAM:
DG HIP (WITH OR WITHOUT PELVIS) 2-3V LEFT

[w pelvis * (1 of 2)]
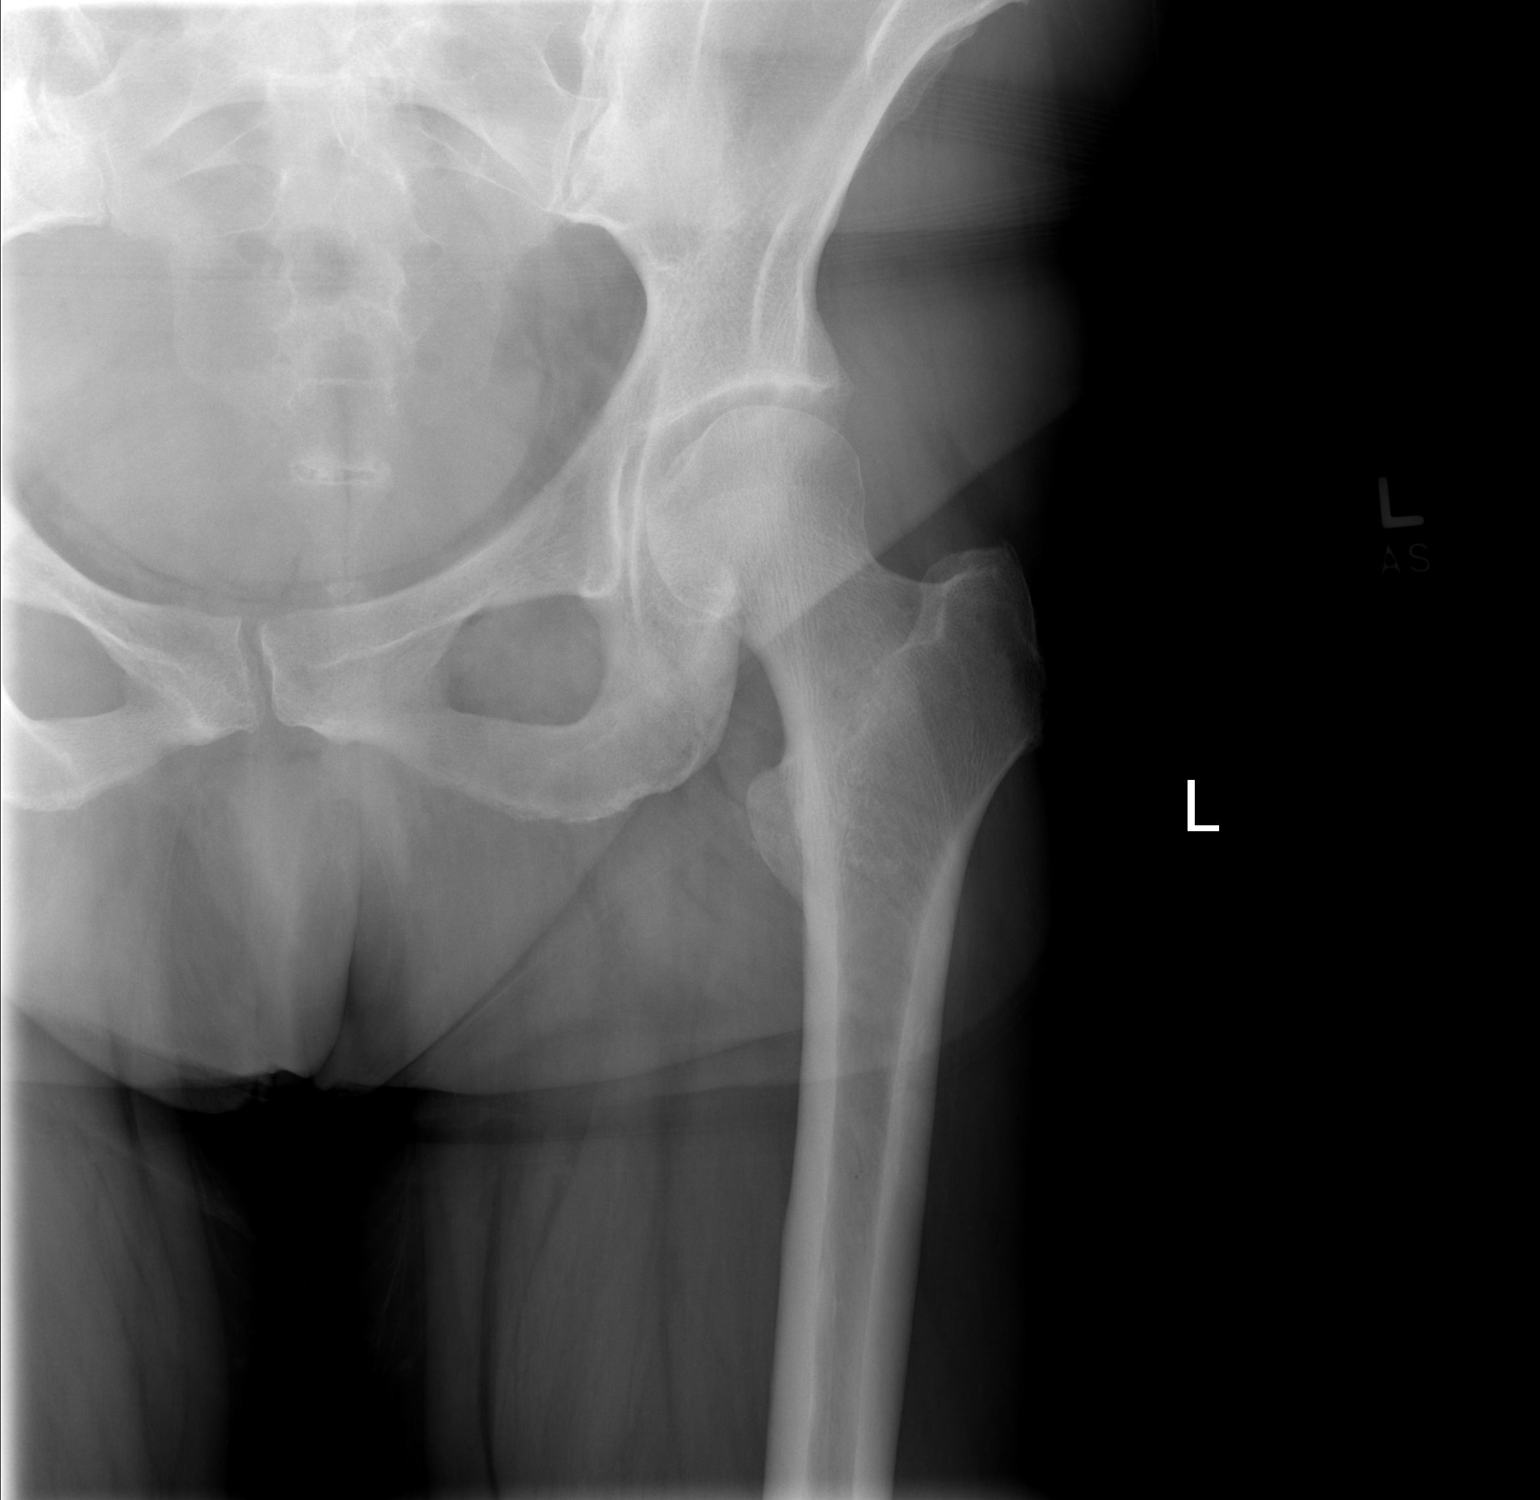

[w pelvis * (2 of 2)]
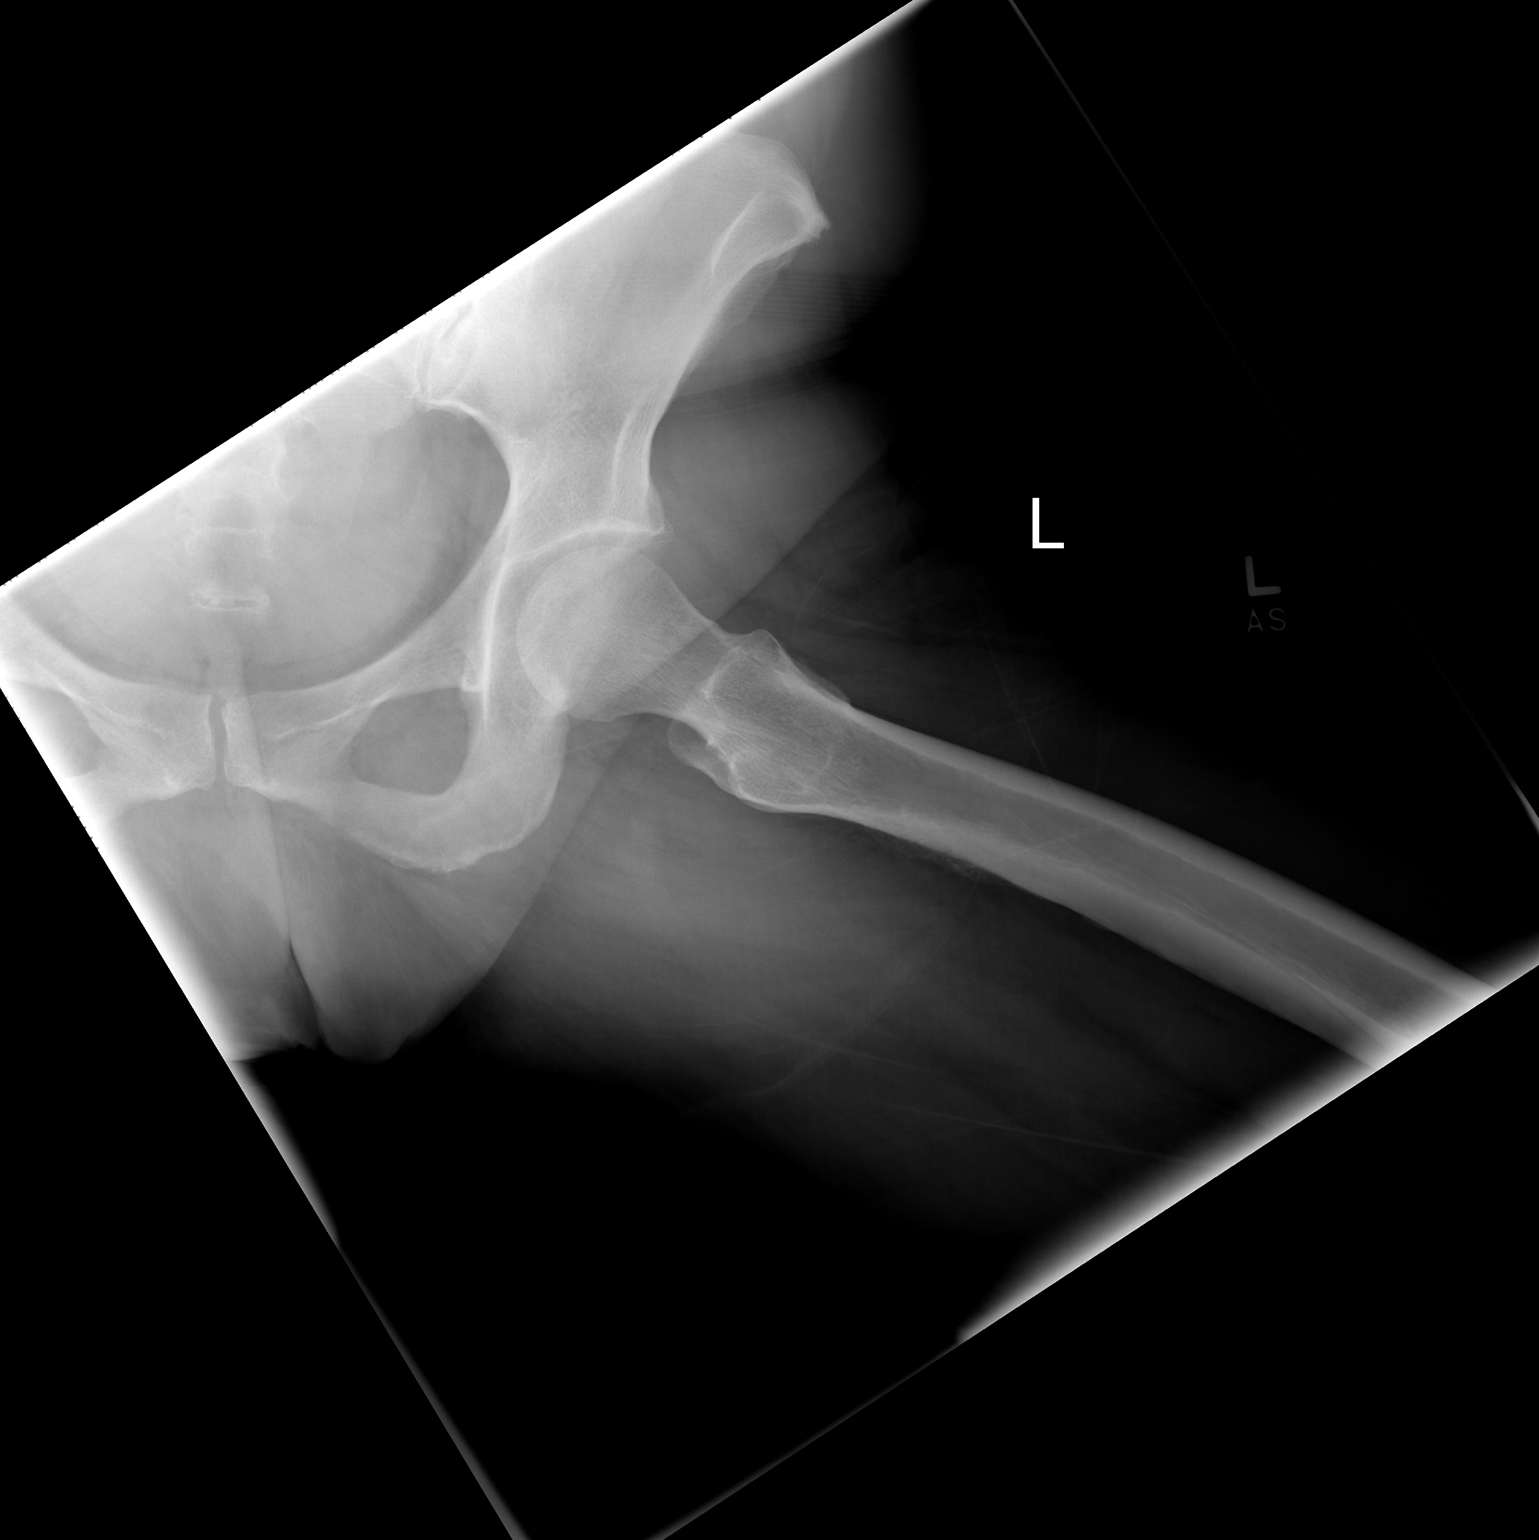

[2 of 2 positions shown; findings below may reference images not displayed]

FINDINGS: Left femoroacetabular joint space is maintained. Minimal inferior
pubic symphysis joint space narrowing. No acute fracture or
dislocation.
IMPRESSION: No significant left hip osteoarthritis.

## 2023-11-12 DIAGNOSIS — Z01419 Encounter for gynecological examination (general) (routine) without abnormal findings: Secondary | ICD-10-CM | POA: Diagnosis not present

## 2023-11-14 ENCOUNTER — Other Ambulatory Visit: Payer: Self-pay | Admitting: Family Medicine

## 2023-11-14 DIAGNOSIS — Z1231 Encounter for screening mammogram for malignant neoplasm of breast: Secondary | ICD-10-CM

## 2023-11-16 DIAGNOSIS — D509 Iron deficiency anemia, unspecified: Secondary | ICD-10-CM | POA: Diagnosis not present

## 2023-11-16 DIAGNOSIS — I10 Essential (primary) hypertension: Secondary | ICD-10-CM | POA: Diagnosis not present

## 2023-11-16 DIAGNOSIS — R252 Cramp and spasm: Secondary | ICD-10-CM | POA: Diagnosis not present

## 2023-11-16 DIAGNOSIS — F325 Major depressive disorder, single episode, in full remission: Secondary | ICD-10-CM | POA: Diagnosis not present

## 2023-11-16 DIAGNOSIS — E1169 Type 2 diabetes mellitus with other specified complication: Secondary | ICD-10-CM | POA: Diagnosis not present

## 2023-11-19 ENCOUNTER — Encounter: Payer: Self-pay | Admitting: Pediatrics

## 2023-11-19 ENCOUNTER — Ambulatory Visit (AMBULATORY_SURGERY_CENTER)

## 2023-11-19 VITALS — Ht 64.0 in | Wt 155.0 lb

## 2023-11-19 DIAGNOSIS — Z85038 Personal history of other malignant neoplasm of large intestine: Secondary | ICD-10-CM

## 2023-11-19 MED ORDER — NA SULFATE-K SULFATE-MG SULF 17.5-3.13-1.6 GM/177ML PO SOLN
1.0000 | Freq: Once | ORAL | 0 refills | Status: AC
Start: 2023-11-19 — End: 2023-11-19

## 2023-11-19 NOTE — Progress Notes (Signed)

## 2023-11-22 ENCOUNTER — Ambulatory Visit
Admission: RE | Admit: 2023-11-22 | Discharge: 2023-11-22 | Disposition: A | Discharge status: Home / Self Care | Source: Ambulatory Visit | Attending: Family Medicine | Admitting: Family Medicine

## 2023-11-22 DIAGNOSIS — Z1231 Encounter for screening mammogram for malignant neoplasm of breast: Secondary | ICD-10-CM

## 2023-11-29 ENCOUNTER — Other Ambulatory Visit: Payer: Self-pay | Admitting: Medical Genetics

## 2023-12-03 NOTE — Progress Notes (Unsigned)
 [facility] History and Physical   Primary Care Physician:  Sun, Vyvyan, MD   Reason for Procedure:  Personal history of colorectal cancer, history of adenomatous colon polyps and sessile serrated polyps  Plan:    Colonoscopy     HPI: Taylor Whitney is a 53 y.o. female undergoing colonoscopy for colorectal cancer surveillance.  Patient was diagnosed with invasive adenocarcinoma of the descending colon in 2017 status post left colon resection.  Most recent colonoscopy in 2021 disclosed 4 polyps which were tubular adenomas and sessile serrated polyps.  No documented family history of colorectal cancer or polyps.  Patient denies current symptoms of rectal bleeding or change in bowel habits.   Past Medical History:  Diagnosis Date   Adenocarcinoma of descending colon (HCC) 12/2015   Flex sigmoidoscopy biopsy of descending colon mass is adenocarcinoma/notes 01/18/2016   Anemia    Depression    Family history of kidney cancer    History of colostomy reversal    Hodgkin lymphoma (HCC) dx'd 01/1995   Dr. Lanny   Hypertension    Large bowel obstruction s/p colectomy/colostomy Aug 2017 01/16/2016   Migraine    maybe once/month (01/18/2016)   Type II diabetes mellitus (HCC) 06/2015    Past Surgical History:  Procedure Laterality Date   ABDOMINAL HYSTERECTOMY  08/2022   COLON RESECTION N/A 01/18/2016   Procedure: OPEN PARTIAL COLECTOMY WITH END COLOSTOMY;  Surgeon: Herlene Beverley Bureau, MD;  Location: [facility] OR;  Service: General;  Laterality: N/A;   COLOSTOMY  01/18/2016   also 2018   COLOSTOMY TAKEDOWN N/A 05/11/2016   Procedure: LAPAROSCOPIC CONVERTED TO OPEN REVERSAL OF END COLOSTOMY;  Surgeon: Herlene Beverley Bureau, MD;  Location: [facility] ORS;  Service: General;  Laterality: N/A;   DILATION AND CURETTAGE OF UTERUS     FLEXIBLE SIGMOIDOSCOPY N/A 01/17/2016   Procedure: FLEXIBLE SIGMOIDOSCOPY;  Surgeon: Gwendlyn ONEIDA Buddy, MD;  Location: [facility] ENDOSCOPY;  Service: Endoscopy;   Laterality: N/A;   HYSTERECTOMY ABDOMINAL WITH SALPINGECTOMY Bilateral 09/13/2022   Procedure: TOTAL HYSTERECTOMY ABDOMINAL WITH BILATERAL  SALPINGECTOMY;  Surgeon: Rosalva Sawyer, MD;  Location: [facility] OR;  Service: Gynecology;  Laterality: Bilateral;   LYMPH NODE BIOPSY  1996   MANDIBLE SURGERY  1988   PARTIAL COLECTOMY  01/18/2016   PROCTOSCOPY  05/11/2016   Procedure: PROCTOSCOPY;  Surgeon: Herlene Beverley Bureau, MD;  Location: [facility] ORS;  Service: General;;   WISDOM TOOTH EXTRACTION  1990    Prior to Admission medications   Medication Sig Start Date End Date Taking? Authorizing Provider  acetaminophen  (TYLENOL ) 500 MG tablet Take 1,000 mg by mouth every 6 (six) hours as needed for moderate pain.    [provider]  ibuprofen  (ADVIL ) 600 MG tablet Take 1 tablet (600 mg total) by mouth every 6 (six) hours as needed for mild pain, moderate pain or cramping. 09/15/22   Rosalva Sawyer, MD  lisinopril -hydrochlorothiazide  (ZESTORETIC ) 20-12.5 MG tablet Take 2 tablets by mouth daily. 10/18/21   [provider]  metFORMIN  (GLUCOPHAGE -XR) 500 MG 24 hr tablet Take 1,000 mg by mouth 2 (two) times daily. 10/27/15   [provider]  oxyCODONE  (OXY IR/ROXICODONE ) 5 MG immediate release tablet Take 1-2 tablets (5-10 mg total) by mouth every 4 (four) hours as needed for moderate pain or severe pain. 09/15/22   Rosalva Sawyer, MD  Semaglutide, 1 MG/DOSE, (OZEMPIC, 1 MG/DOSE,) 2 MG/1.5ML SOPN Inject 1 mg into the skin every Sunday.    [provider]  simvastatin  (ZOCOR ) 5 MG tablet  Take 5 mg by mouth every evening. 02/05/19   [provider]  venlafaxine  XR (EFFEXOR -XR) 75 MG 24 hr capsule Take 225 mg by mouth at bedtime. 12/28/15   [provider]  prochlorperazine  (COMPAZINE ) 10 MG tablet Take 1 tablet (10 mg total) by mouth every 6 (six) hours as needed (Nausea or vomiting). 02/01/16 10/05/16  Lanny Callander, MD    Current Outpatient Medications  Medication Sig Dispense Refill    acetaminophen  (TYLENOL ) 500 MG tablet Take 1,000 mg by mouth every 6 (six) hours as needed for moderate pain.     venlafaxine  XR (EFFEXOR -XR) 75 MG 24 hr capsule Take 225 mg by mouth at bedtime.  5   ibuprofen  (ADVIL ) 600 MG tablet Take 1 tablet (600 mg total) by mouth every 6 (six) hours as needed for mild pain, moderate pain or cramping. 30 tablet 1   lisinopril -hydrochlorothiazide  (ZESTORETIC ) 20-12.5 MG tablet Take 2 tablets by mouth daily.     metFORMIN  (GLUCOPHAGE -XR) 500 MG 24 hr tablet Take 1,000 mg by mouth 2 (two) times daily.  0   Semaglutide, 1 MG/DOSE, (OZEMPIC, 1 MG/DOSE,) 2 MG/1.5ML SOPN Inject 1 mg into the skin every Sunday.     simvastatin  (ZOCOR ) 5 MG tablet Take 5 mg by mouth every evening.     Current Facility-Administered Medications  Medication Dose Route Frequency Provider Last Rate Last Admin   0.9 %  sodium chloride  infusion  500 mL Intravenous Once Krystan Northrop, Inocente HERO, MD        Allergies as of 12/05/2023 - Review Complete 12/05/2023  Allergen Reaction Noted   Semaglutide Nausea And Vomiting 09/01/2022   Sulfa antibiotics Rash 01/16/2016    Family History  Problem Relation Age of Onset   Diabetes Mother    Prostate cancer Father    Cancer Paternal Uncle    Renal cancer Paternal Uncle        smoker   Diabetes Maternal Grandmother    Diabetes Maternal Grandfather    Clotting disorder Paternal Grandmother    COPD Paternal Grandfather    Colon cancer Neg Hx    Colon polyps Neg Hx    Esophageal cancer Neg Hx    Stomach cancer Neg Hx    Rectal cancer Neg Hx    BRCA 1/2 Neg Hx    Breast cancer Neg Hx     Social History   Socioeconomic History   Marital status: Single    Spouse name: Not on file   Number of children: 0   Years of education: Not on file   Highest education level: Not on file  Occupational History   Occupation: Professor    Comment: Sports coach  Tobacco Use   Smoking status: Never   Smokeless tobacco: Never  Vaping Use    Vaping status: Never Used  Substance and Sexual Activity   Alcohol use: Yes    Comment: rare social; once socially   Drug use: No   Sexual activity: Not Currently  Other Topics Concern   Not on file  Social History Narrative   Single, lives alone with her pet dog   Pharmacist, community in community college   Originally from [place]     Social Drivers of Health   Financial Resource Strain: Not on file  Food Insecurity: No Food Insecurity (09/13/2022)   Hunger Vital Sign    Worried About Running Out of Food in the Last Year: Never true    Ran Out of Food in the Last Year: Never true  Transportation Needs: No Transportation Needs (09/13/2022)   PRAPARE - Administrator, Civil Service (Medical): No    Lack of Transportation (Non-Medical): No  Physical Activity: Not on file  Stress: Not on file  Social Connections: Not on file  Intimate Partner Violence: Not At Risk (09/13/2022)   Humiliation, Afraid, Rape, and Kick questionnaire    Fear of Current or Ex-Partner: No    Emotionally Abused: No    Physically Abused: No    Sexually Abused: No    Review of Systems:  All other review of systems negative except as mentioned in the HPI.  Physical Exam: Vital signs BP (!) 132/91   Pulse 94   Temp 97.9 F (36.6 C)   Ht 5' 4 (1.626 m)   Wt 155 lb (70.3 kg)   LMP  (LMP Unknown)   SpO2 98%   BMI 26.61 kg/m   General:   Alert,  Well-developed, well-nourished, pleasant and cooperative in NAD Airway:  Mallampati 2 Lungs:  Clear throughout to auscultation.   Heart:  Regular rate and rhythm; no murmurs, clicks, rubs,  or gallops. Abdomen:  Soft, nontender and nondistended. Normal bowel sounds.   Neuro/Psych:  Normal mood and affect. A and O x 3  Inocente Hausen, MD [facility]

## 2023-12-05 ENCOUNTER — Encounter: Payer: Self-pay | Admitting: Pediatrics

## 2023-12-05 ENCOUNTER — Ambulatory Visit: Admitting: Pediatrics

## 2023-12-05 VITALS — BP 117/74 | HR 81 | Temp 97.9°F | Resp 14 | Ht 64.0 in | Wt 155.0 lb

## 2023-12-05 DIAGNOSIS — K644 Residual hemorrhoidal skin tags: Secondary | ICD-10-CM | POA: Diagnosis not present

## 2023-12-05 DIAGNOSIS — K635 Polyp of colon: Secondary | ICD-10-CM

## 2023-12-05 DIAGNOSIS — Z1211 Encounter for screening for malignant neoplasm of colon: Secondary | ICD-10-CM | POA: Diagnosis not present

## 2023-12-05 DIAGNOSIS — K5730 Diverticulosis of large intestine without perforation or abscess without bleeding: Secondary | ICD-10-CM | POA: Diagnosis not present

## 2023-12-05 DIAGNOSIS — D123 Benign neoplasm of transverse colon: Secondary | ICD-10-CM

## 2023-12-05 DIAGNOSIS — Z85038 Personal history of other malignant neoplasm of large intestine: Secondary | ICD-10-CM

## 2023-12-05 MED ORDER — SODIUM CHLORIDE 0.9 % IV SOLN: 500.0000 mL | Freq: Once | INTRAVENOUS | Status: DC

## 2023-12-05 NOTE — Patient Instructions (Signed)

## 2023-12-05 NOTE — Op Note (Signed)
 [facility] Patient Name: Taylor Whitney Procedure Date: 12/05/2023 7:38 AM MRN: [id number] Endoscopist: Inocente Hausen , MD, [id number] Age: 53 Referring MD:  Date of Birth: February 22, 1971 Gender: Female Account #: [id number] Procedure:                Colonoscopy Indications:              High risk colon cancer surveillance: Personal                            history of colon cancer, Last colonoscopy: November                            2021;Hhistory of invasive adenocarcinoma of the                            descending colon status post descending colon                            resection 2017; Personal history of adenomatous                            colon polyps and sessile serrated colon polyps. Medicines:                Monitored Anesthesia Care Procedure:                Pre-Anesthesia Assessment:                           - Prior to the procedure, a History and Physical                            was performed, and patient medications and                            allergies were reviewed. The patient's tolerance of                            previous anesthesia was also reviewed. The risks                            and benefits of the procedure and the sedation                            options and risks were discussed with the patient.                            All questions were answered, and informed consent                            was obtained. Prior Anticoagulants: The patient has                            taken no anticoagulant or antiplatelet agents. ASA  Grade Assessment: III - A patient with severe                            systemic disease. After reviewing the risks and                            benefits, the patient was deemed in satisfactory                            condition to undergo the procedure.                           After obtaining informed consent, the colonoscope                            was passed  under direct vision. Throughout the                            procedure, the patient's blood pressure, pulse, and                            oxygen saturations were monitored continuously. The                            Olympus Scope [id number] was introduced through the                            anus and advanced to the terminal ileum. The                            colonoscopy was performed without difficulty. The                            patient tolerated the procedure well. The quality                            of the bowel preparation was good. The terminal                            ileum, ileocecal valve, appendiceal orifice, and                            rectum were photographed. Scope In: 7:58:48 AM Scope Out: 8:16:12 AM Scope Withdrawal Time: 0 hours 15 minutes 3 seconds  Total Procedure Duration: 0 hours 17 minutes 24 seconds  Findings:                 Skin tags were found on perianal exam.                           The digital rectal exam was normal. Pertinent                            negatives include normal sphincter tone and no  palpable rectal lesions.                           A few small-mouthed diverticula were found in the                            sigmoid colon. A healthy appearing colocolonic                            anastomosis was noted in the sigmoid colon.                           A 3 mm polyp was found in the transverse colon. The                            polyp was sessile. The polyp was removed with a                            cold biopsy forceps. Resection and retrieval were                            complete.                           The terminal ileum appeared normal.                           The retroflexed view of the distal rectum and anal                            verge was normal and showed no anal or rectal                            abnormalities. Complications:            No immediate complications.  Estimated blood loss:                            Minimal. Estimated Blood Loss:     Estimated blood loss was minimal. Impression:               - Perianal skin tags found on perianal exam.                           - Healthy appearing colocolonic anastomosis in the                            sigmoid colon.                           - Diverticulosis in the sigmoid colon.                           - One 3 mm polyp in the transverse colon, removed  with a cold biopsy forceps. Resected and retrieved.                           - The examined portion of the ileum was normal.                           - The distal rectum and anal verge are normal on                            retroflexion view. Recommendation:           - Discharge patient to home (ambulatory).                           - Repeat colonoscopy in 5 years for surveillance.                           - The findings and recommendations were discussed                            with the patient's family.                           - Return to referring physician.                           - Patient has a contact number available for                            emergencies. The signs and symptoms of potential                            delayed complications were discussed with the                            patient. Return to normal activities tomorrow.                            Written discharge instructions were provided to the                            patient. Inocente Hausen, MD 12/05/2023 8:23:17 AM This report has been signed electronically.

## 2023-12-05 NOTE — Progress Notes (Signed)
 Vss nad trans to pacu

## 2023-12-05 NOTE — Progress Notes (Signed)
 Called to room to assist during endoscopic procedure.  Patient ID and intended procedure confirmed with present staff. Received instructions for my participation in the procedure from the performing physician.

## 2023-12-05 NOTE — Progress Notes (Signed)
 Pt's states no medical or surgical changes since previsit or office visit.

## 2023-12-06 ENCOUNTER — Telehealth: Payer: Self-pay

## 2023-12-06 NOTE — Telephone Encounter (Signed)
No answer on follow up call. No voicemail.  

## 2023-12-10 ENCOUNTER — Ambulatory Visit: Payer: Self-pay | Admitting: Pediatrics

## 2023-12-10 LAB — SURGICAL PATHOLOGY

## 2024-03-18 DIAGNOSIS — R110 Nausea: Secondary | ICD-10-CM | POA: Diagnosis not present

## 2024-03-18 DIAGNOSIS — R1013 Epigastric pain: Secondary | ICD-10-CM | POA: Diagnosis not present

## 2024-03-18 DIAGNOSIS — E1169 Type 2 diabetes mellitus with other specified complication: Secondary | ICD-10-CM | POA: Diagnosis not present

## 2024-03-18 DIAGNOSIS — I10 Essential (primary) hypertension: Secondary | ICD-10-CM | POA: Diagnosis not present

## 2024-03-20 ENCOUNTER — Other Ambulatory Visit: Payer: Self-pay | Admitting: Medical Genetics

## 2024-03-20 DIAGNOSIS — Z006 Encounter for examination for normal comparison and control in clinical research program: Secondary | ICD-10-CM

## 2024-04-16 ENCOUNTER — Ambulatory Visit
Admission: RE | Admit: 2024-04-16 | Discharge: 2024-04-16 | Disposition: A | Discharge status: Home / Self Care | Source: Ambulatory Visit | Attending: Nurse Practitioner | Admitting: Nurse Practitioner

## 2024-04-16 ENCOUNTER — Other Ambulatory Visit: Payer: Self-pay

## 2024-04-16 VITALS — BP 114/79 | HR 93 | Temp 98.6°F | Resp 16

## 2024-04-16 DIAGNOSIS — J029 Acute pharyngitis, unspecified: Secondary | ICD-10-CM | POA: Diagnosis not present

## 2024-04-16 DIAGNOSIS — J069 Acute upper respiratory infection, unspecified: Secondary | ICD-10-CM | POA: Diagnosis not present

## 2024-04-16 LAB — POC SOFIA SARS ANTIGEN FIA: SARS Coronavirus 2 Ag: NEGATIVE

## 2024-04-16 LAB — POCT RAPID STREP A (OFFICE): Rapid Strep A Screen: NEGATIVE

## 2024-04-16 NOTE — Discharge Instructions (Signed)
 You tested negative for strep throat and covid. Please treat your symptoms with over the counter cough medication, tylenol  or ibuprofen , humidifier, and rest. Viral illnesses can last 7-14 days. Please follow up with your PCP if your symptoms are not improving. Please go to the ER for any worsening symptoms. This includes but is not limited to fever you can not control with tylenol  or ibuprofen , you are not able to stay hydrated, you have shortness of breath or chest pain.  Thank you for choosing [facility] for your healthcare needs. I hope you feel better soon!

## 2024-04-16 NOTE — ED Triage Notes (Signed)
Pt c/o sore throat started yesterday.

## 2024-04-16 NOTE — ED Provider Notes (Signed)
 [facility]    CSN: [id number] Arrival date & time: 04/16/24  1111      History   Chief Complaint Chief Complaint  Patient presents with   Sore Throat    Entered by patient    HPI Taylor Whitney is a 53 y.o. female  presents for evaluation of URI symptoms for 1 days. Patient reports associated symptoms of sore throat. Denies N/V/D, cough, congestion, fevers, ear pain, bodyaches, shortness of breath. Patient does not have a hx of asthma. Patient is not an active smoker.   Reports no known sick contacts.  Pt has taken nothing OTC for symptoms. Pt has no other concerns at this time.    Sore Throat    Past Medical History:  Diagnosis Date   Adenocarcinoma of descending colon (HCC) 12/2015   Flex sigmoidoscopy biopsy of descending colon mass is adenocarcinoma/notes 01/18/2016   Anemia    Depression    Family history of kidney cancer    History of colostomy reversal    Hodgkin lymphoma (HCC) dx'd 01/1995   Dr. Lanny   Hypertension    Large bowel obstruction s/p colectomy/colostomy Aug 2017 01/16/2016   Migraine    maybe once/month (01/18/2016)   Type II diabetes mellitus (HCC) 06/2015    Patient Active Problem List   Diagnosis Date Noted   Fibroids 09/13/2022   S/P TAH (total abdominal hysterectomy) 09/13/2022   Genetic testing 03/28/2016   Iron deficiency anemia 03/16/2016   Family history of kidney cancer    Cancer of left colon s/p colostomy takedown 05/11/2016    Diabetes mellitus with complication (HCC)    Hypokalemia    Large bowel obstruction s/p colectomy/colostomy Aug 2017 01/16/2016   Dermatomyositis (HCC) 01/16/2016   Depression 01/16/2016   Electrolyte abnormality 01/16/2016    Past Surgical History:  Procedure Laterality Date   ABDOMINAL HYSTERECTOMY  08/2022   COLON RESECTION N/A 01/18/2016   Procedure: OPEN PARTIAL COLECTOMY WITH END COLOSTOMY;  Surgeon: Herlene Beverley Bureau, MD;  Location: [facility] OR;  Service: General;  Laterality:  N/A;   COLOSTOMY  01/18/2016   also 2018   COLOSTOMY TAKEDOWN N/A 05/11/2016   Procedure: LAPAROSCOPIC CONVERTED TO OPEN REVERSAL OF END COLOSTOMY;  Surgeon: Herlene Beverley Bureau, MD;  Location: [facility] ORS;  Service: General;  Laterality: N/A;   DILATION AND CURETTAGE OF UTERUS     FLEXIBLE SIGMOIDOSCOPY N/A 01/17/2016   Procedure: FLEXIBLE SIGMOIDOSCOPY;  Surgeon: Gwendlyn ONEIDA Buddy, MD;  Location[facility] ENDOSCOPY;  Service: Endoscopy;  Laterality: N/A;   HYSTERECTOMY ABDOMINAL WITH SALPINGECTOMY Bilateral 09/13/2022   Procedure: TOTAL HYSTERECTOMY ABDOMINAL WITH BILATERAL  SALPINGECTOMY;  Surgeon: Rosalva Sawyer, MD;  Location: [facility] OR;  Service: Gynecology;  Laterality: Bilateral;   LYMPH NODE BIOPSY  1996   MANDIBLE SURGERY  1988   PARTIAL COLECTOMY  01/18/2016   PROCTOSCOPY  05/11/2016   Procedure: PROCTOSCOPY;  Surgeon: Herlene Beverley Bureau, MD;  Location: [facility] ORS;  Service: General;;   WISDOM TOOTH EXTRACTION  1990    OB History   No obstetric history on file.      Home Medications    Prior to Admission medications   Medication Sig Start Date End Date Taking? Authorizing Provider  acetaminophen  (TYLENOL ) 500 MG tablet Take 1,000 mg by mouth every 6 (six) hours as needed for moderate pain.    [provider]  ibuprofen  (ADVIL ) 600 MG tablet Take 1 tablet (600 mg total) by mouth every 6 (six) hours as needed for mild pain,  moderate pain or cramping. 09/15/22   Rosalva Sawyer, MD  lisinopril -hydrochlorothiazide  (ZESTORETIC ) 20-12.5 MG tablet Take 2 tablets by mouth daily. 10/18/21   [provider]  metFORMIN  (GLUCOPHAGE -XR) 500 MG 24 hr tablet Take 1,000 mg by mouth 2 (two) times daily. 10/27/15   [provider]  Semaglutide, 1 MG/DOSE, (OZEMPIC, 1 MG/DOSE,) 2 MG/1.5ML SOPN Inject 1 mg into the skin every Sunday.    [provider]  simvastatin  (ZOCOR ) 5 MG tablet Take 5 mg by mouth every evening. 02/05/19   [provider]  venlafaxine  XR (EFFEXOR -XR) 75 MG  24 hr capsule Take 225 mg by mouth at bedtime. 12/28/15   [provider]  prochlorperazine  (COMPAZINE ) 10 MG tablet Take 1 tablet (10 mg total) by mouth every 6 (six) hours as needed (Nausea or vomiting). 02/01/16 10/05/16  Lanny Callander, MD    Family History Family History  Problem Relation Age of Onset   Diabetes Mother    Prostate cancer Father    Cancer Paternal Uncle    Renal cancer Paternal Uncle        smoker   Diabetes Maternal Grandmother    Diabetes Maternal Grandfather    Clotting disorder Paternal Grandmother    COPD Paternal Grandfather    Colon cancer Neg Hx    Colon polyps Neg Hx    Esophageal cancer Neg Hx    Stomach cancer Neg Hx    Rectal cancer Neg Hx    BRCA 1/2 Neg Hx    Breast cancer Neg Hx     Social History Social History   Tobacco Use   Smoking status: Never   Smokeless tobacco: Never  Vaping Use   Vaping status: Never Used  Substance Use Topics   Alcohol use: Yes    Comment: rare social; once socially   Drug use: No     Allergies   Semaglutide and Sulfa antibiotics   Review of Systems Review of Systems  HENT:  Positive for sore throat.      Physical Exam Triage Vital Signs ED Triage Vitals  Encounter Vitals Group     BP 04/16/24 1152 114/79     Girls Systolic BP Percentile --      Girls Diastolic BP Percentile --      Boys Systolic BP Percentile --      Boys Diastolic BP Percentile --      Pulse Rate 04/16/24 1152 93     Resp 04/16/24 1152 16     Temp 04/16/24 1152 98.6 F (37 C)     Temp Source 04/16/24 1152 Oral     SpO2 04/16/24 1152 100 %     Weight --      Height --      Head Circumference --      Peak Flow --      Pain Score 04/16/24 1150 7     Pain Loc --      Pain Education --      Exclude from Growth Chart --    No data found.  Updated Vital Signs BP 114/79   Pulse 93   Temp 98.6 F (37 C) (Oral)   Resp 16   LMP  (LMP Unknown)   SpO2 100%   Visual Acuity Right Eye Distance:   Left Eye Distance:    Bilateral Distance:    Right Eye Near:   Left Eye Near:    Bilateral Near:     Physical Exam Vitals and nursing note reviewed.  Constitutional:      General: She is not in acute distress.    Appearance: She is well-developed. She is not ill-appearing.  HENT:     Head: Normocephalic and atraumatic.     Right Ear: Tympanic membrane and ear canal normal.     Left Ear: Tympanic membrane and ear canal normal.     Nose: No congestion.     Mouth/Throat:     Mouth: Mucous membranes are moist.     Pharynx: Oropharynx is clear. Uvula midline. Posterior oropharyngeal erythema present.     Tonsils: No tonsillar exudate or tonsillar abscesses.  Eyes:     Conjunctiva/sclera: Conjunctivae normal.     Pupils: Pupils are equal, round, and reactive to light.  Cardiovascular:     Rate and Rhythm: Normal rate and regular rhythm.     Heart sounds: Normal heart sounds.  Pulmonary:     Effort: Pulmonary effort is normal.     Breath sounds: Normal breath sounds.  Musculoskeletal:     Cervical back: Normal range of motion and neck supple.  Lymphadenopathy:     Cervical: No cervical adenopathy.  Skin:    General: Skin is warm and dry.  Neurological:     General: No focal deficit present.     Mental Status: She is alert and oriented to person, place, and time.  Psychiatric:        Mood and Affect: Mood normal.        Behavior: Behavior normal.      UC Treatments / Results  Labs (all labs ordered are listed, but only abnormal results are displayed) Labs Reviewed  POCT RAPID STREP A (OFFICE)  POC SOFIA SARS ANTIGEN FIA    EKG   Radiology No results found.  Procedures Procedures (including critical care time)  Medications Ordered in UC Medications - No data to display  Initial Impression / Assessment and Plan / UC Course  I have reviewed the triage vital signs and the nursing notes.  Pertinent labs & imaging results that were available during my care of the patient were  reviewed by me and considered in my medical decision making (see chart for details).     Negative COVID and strep throat testing.  Discussed viral illness and symptomatic treatment.  PCP follow-up if symptoms do not improve.  ER precautions reviewed Final Clinical Impressions(s) / UC Diagnoses   Final diagnoses:  Sore throat  Viral upper respiratory illness     Discharge Instructions      You tested negative for strep throat and covid. Please treat your symptoms with over the counter cough medication, tylenol  or ibuprofen , humidifier, and rest. Viral illnesses can last 7-14 days. Please follow up with your PCP if your symptoms are not improving. Please go to the ER for any worsening symptoms. This includes but is not limited to fever you can not control with tylenol  or ibuprofen , you are not able to stay hydrated, you have shortness of breath or chest pain.  Thank you for choosing [facility] for your healthcare needs. I hope you feel better soon!      ED Prescriptions   None    PDMP not reviewed this encounter.   Loreda Myla SAUNDERS, NP 04/16/24 [phone]

## 2024-05-05 LAB — GENECONNECT MOLECULAR SCREEN: Genetic Analysis Overall Interpretation: NEGATIVE

## 2024-05-20 ENCOUNTER — Ambulatory Visit
Admission: RE | Admit: 2024-05-20 | Discharge: 2024-05-20 | Disposition: A | Discharge status: Home / Self Care | Payer: Self-pay | Source: Ambulatory Visit | Attending: Physician Assistant | Admitting: Physician Assistant

## 2024-05-20 ENCOUNTER — Other Ambulatory Visit: Payer: Self-pay

## 2024-05-20 VITALS — BP 115/82 | HR 106 | Temp 97.9°F | Resp 18

## 2024-05-20 DIAGNOSIS — J0120 Acute ethmoidal sinusitis, unspecified: Secondary | ICD-10-CM | POA: Diagnosis not present

## 2024-05-20 DIAGNOSIS — R051 Acute cough: Secondary | ICD-10-CM | POA: Diagnosis not present

## 2024-05-20 MED ORDER — AMOXICILLIN-POT CLAVULANATE 875-125 MG PO TABS: 1.0000 | ORAL_TABLET | Freq: Two times a day (BID) | ORAL | 0 refills | Status: AC

## 2024-05-20 NOTE — ED Triage Notes (Signed)
 C/O productive cough and nasal congestion x approx 1 month; states started with fevers up to 102 yesterday.  Has been taking Dayquil and Nyquil without any relief.

## 2024-05-20 NOTE — Discharge Instructions (Signed)
 Take the antibiotics as directed for sinus infection.  Your cough may be unrelated and could be secondary to your blood pressure medicine.  See Dr. Austin to discuss your cough.  Return if any problems.

## 2024-05-20 NOTE — ED Provider Notes (Signed)
 " [facility]    CSN: [id number] Arrival date & time: 05/20/24  9056      History   Chief Complaint Chief Complaint  Patient presents with   Cough    Appt 0945    HPI Taylor Whitney is a 53 y.o. female.   Patient complains of a cough and congestion.  Patient states that she has had the cough for over a month.  Patient states that she has had sinus congestion nasal congestion and facial pressure for the past week.  Patient is currently taking lisinopril .  Patient denies any shortness of breath.  Patient is not have any difficulty breathing.  The history is provided by the patient. No language interpreter was used.  Cough   Past Medical History:  Diagnosis Date   Adenocarcinoma of descending colon (HCC) 12/2015   Flex sigmoidoscopy biopsy of descending colon mass is adenocarcinoma/notes 01/18/2016   Anemia    Depression    Family history of kidney cancer    History of colostomy reversal    Hodgkin lymphoma (HCC) dx'd 01/1995   Dr. Lanny   Hypertension    Large bowel obstruction s/p colectomy/colostomy Aug 2017 01/16/2016   Migraine    maybe once/month (01/18/2016)   Type II diabetes mellitus (HCC) 06/2015    Patient Active Problem List   Diagnosis Date Noted   Fibroids 09/13/2022   S/P TAH (total abdominal hysterectomy) 09/13/2022   Genetic testing 03/28/2016   Iron deficiency anemia 03/16/2016   Family history of kidney cancer    Cancer of left colon s/p colostomy takedown 05/11/2016    Diabetes mellitus with complication (HCC)    Hypokalemia    Large bowel obstruction s/p colectomy/colostomy Aug 2017 01/16/2016   Dermatomyositis (HCC) 01/16/2016   Depression 01/16/2016   Electrolyte abnormality 01/16/2016    Past Surgical History:  Procedure Laterality Date   ABDOMINAL HYSTERECTOMY  08/2022   COLON RESECTION N/A 01/18/2016   Procedure: OPEN PARTIAL COLECTOMY WITH END COLOSTOMY;  Surgeon: Herlene Beverley Bureau, MD;  [facility] OR;  Service:  General;  Laterality: N/A;   COLOSTOMY  01/18/2016   also 2018   COLOSTOMY TAKEDOWN N/A 05/11/2016   Procedure: LAPAROSCOPIC CONVERTED TO OPEN REVERSAL OF END COLOSTOMY;  Surgeon: Herlene Beverley Bureau, MD;  Location: [facility] ORS;  Service: General;  Laterality: N/A;   DILATION AND CURETTAGE OF UTERUS     FLEXIBLE SIGMOIDOSCOPY N/A 01/17/2016   Procedure: FLEXIBLE SIGMOIDOSCOPY;  Surgeon: Gwendlyn ONEIDA Buddy, MD;  Location[facility] END[facility]COPY;  Service: Endoscopy;  Laterality: N/A;   HYSTERECTOMY ABDOMINAL WITH SALPINGECTOMY Bilateral 09/13/2022   Procedure: TOTAL HYSTERECTOMY ABDOMINAL WITH BILATERAL  SALPINGECTOMY;  Surgeon: Rosalva Sawyer, MD;  Location: [facility] OR;  Service: Gynecology;  Laterality: Bilateral;   LYMPH NODE BIOPSY  1996   MANDIBLE SURGERY  1988   PARTIAL COLECTOMY  01/18/2016   PROCTOSCOPY  05/11/2016   Procedure: PROCTOSCOPY;  Surgeon: Herlene Beverley Bureau, MD;  Location: [facility] ORS;  Service: General;;   WISDOM TOOTH EXTRACTION  1990    OB History   No obstetric history on file.      Home Medications    Prior to Admission medications  Medication Sig Start Date End Date Taking? Authorizing Provider  amoxicillin -clavulanate (AUGMENTIN ) 875-125 MG tablet Take 1 tablet by mouth 2 (two) times daily. 05/20/24  Yes Nyzier Boivin K, PA-C  lisinopril -hydrochlorothiazide  (ZESTORETIC ) 20-12.5 MG tablet Take 2 tablets by mouth daily. 10/18/21  Yes [provider]  metFORMIN  (GLUCOPHAGE -XR) 500 MG 24  hr tablet Take 1,000 mg by mouth 2 (two) times daily. 10/27/15  Yes [provider]  simvastatin  (ZOCOR ) 5 MG tablet Take 5 mg by mouth every evening. 02/05/19  Yes [provider]  venlafaxine  XR (EFFEXOR -XR) 75 MG 24 hr capsule Take 225 mg by mouth at bedtime. 12/28/15  Yes [provider]  acetaminophen  (TYLENOL ) 500 MG tablet Take 1,000 mg by mouth every 6 (six) hours as needed for moderate pain.    [provider]  ibuprofen  (ADVIL ) 600 MG tablet Take 1 tablet  (600 mg total) by mouth every 6 (six) hours as needed for mild pain, moderate pain or cramping. 09/15/22   Rosalva Sawyer, MD  Semaglutide, 1 MG/DOSE, (OZEMPIC, 1 MG/DOSE,) 2 MG/1.5ML SOPN Inject 1 mg into the skin every Sunday.    [provider]  prochlorperazine  (COMPAZINE ) 10 MG tablet Take 1 tablet (10 mg total) by mouth every 6 (six) hours as needed (Nausea or vomiting). 02/01/16 10/05/16  Lanny Callander, MD    Family History Family History  Problem Relation Age of Onset   Diabetes Mother    Prostate cancer Father    Diabetes Maternal Grandmother    Diabetes Maternal Grandfather    Clotting disorder Paternal Grandmother    COPD Paternal Grandfather    Cancer Paternal Uncle    Renal cancer Paternal Uncle        smoker   Colon cancer Neg Hx    Colon polyps Neg Hx    Esophageal cancer Neg Hx    Stomach cancer Neg Hx    Rectal cancer Neg Hx    BRCA 1/2 Neg Hx    Breast cancer Neg Hx     Social History Social History[1]   Allergies   Semaglutide and Sulfa antibiotics   Review of Systems Review of Systems  HENT:  Positive for sinus pressure and sinus pain.   Respiratory:  Positive for cough.   All other systems reviewed and are negative.    Physical Exam Triage Vital Signs ED Triage Vitals  Encounter Vitals Group     BP 05/20/24 0948 115/82     Girls Systolic BP Percentile --      Girls Diastolic BP Percentile --      Boys Systolic BP Percentile --      Boys Diastolic BP Percentile --      Pulse Rate 05/20/24 0948 (!) 106     Resp 05/20/24 0948 18     Temp 05/20/24 0948 97.9 F (36.6 C)     Temp Source 05/20/24 0948 Oral     SpO2 05/20/24 0948 98 %     Weight --      Height --      Head Circumference --      Peak Flow --      Pain Score 05/20/24 0950 0     Pain Loc --      Pain Education --      Exclude from Growth Chart --    No data found.  Updated Vital Signs BP 115/82   Pulse (!) 106   Temp 97.9 F (36.6 C) (Oral)   Resp 18   LMP  (LMP  Unknown)   SpO2 98%   Visual Acuity Right Eye Distance:   Left Eye Distance:   Bilateral Distance:    Right Eye Near:   Left Eye Near:    Bilateral Near:     Physical Exam Vitals reviewed.  Constitutional:  Appearance: Normal appearance.  HENT:     Right Ear: Tympanic membrane normal.     Left Ear: Tympanic membrane normal.     Nose: Nose normal.     Mouth/Throat:     Mouth: Mucous membranes are moist.  Cardiovascular:     Rate and Rhythm: Normal rate.     Pulses: Normal pulses.  Pulmonary:     Effort: Pulmonary effort is normal.  Musculoskeletal:        General: Normal range of motion.  Skin:    General: Skin is warm.  Neurological:     General: No focal deficit present.     Mental Status: She is alert.  Psychiatric:        Mood and Affect: Mood normal.      UC Treatments / Results  Labs (all labs ordered are listed, but only abnormal results are displayed) Labs Reviewed - No data to display  EKG   Radiology No results found.  Procedures Procedures (including critical care time)  Medications Ordered in UC Medications - No data to display  Initial Impression / Assessment and Plan / UC Course  I have reviewed the triage vital signs and the nursing notes.  Pertinent labs & imaging results that were available during my care of the patient were reviewed by me and considered in my medical decision making (see chart for details).      Final Clinical Impressions(s) / UC Diagnoses   Final diagnoses:  Acute non-recurrent ethmoidal sinusitis  Acute cough     Discharge Instructions      Take the antibiotics as directed for sinus infection.  Your cough may be unrelated and could be secondary to your blood pressure medicine.  See Dr. Austin to discuss your cough.  Return if any problems.   ED Prescriptions     Medication Sig Dispense Auth. Provider   amoxicillin -clavulanate (AUGMENTIN ) 875-125 MG tablet Take 1 tablet by mouth 2 (two) times daily.  20 tablet Brodan Grewell K, PA-C      PDMP not reviewed this encounter.    [1]  Social History Tobacco Use   Smoking status: Never   Smokeless tobacco: Never  Vaping Use   Vaping status: Never Used  Substance Use Topics   Alcohol use: Yes    Comment: rarely   Drug use: No     Flint Sonny POUR, PA-C 05/20/24 1134  "
